# Patient Record
Sex: Male | Born: 1940
Health system: Southern US, Community
[De-identification: ages and names within clinical notes are randomized; demographics above are authoritative.]

## PROBLEM LIST (undated history)

## (undated) DIAGNOSIS — E119 Type 2 diabetes mellitus without complications: Secondary | ICD-10-CM

## (undated) DIAGNOSIS — M109 Gout, unspecified: Secondary | ICD-10-CM

## (undated) DIAGNOSIS — R011 Cardiac murmur, unspecified: Secondary | ICD-10-CM

## (undated) DIAGNOSIS — M2550 Pain in unspecified joint: Secondary | ICD-10-CM

## (undated) DIAGNOSIS — I350 Nonrheumatic aortic (valve) stenosis: Secondary | ICD-10-CM

## (undated) DIAGNOSIS — M1990 Unspecified osteoarthritis, unspecified site: Secondary | ICD-10-CM

## (undated) DIAGNOSIS — Z973 Presence of spectacles and contact lenses: Secondary | ICD-10-CM

## (undated) DIAGNOSIS — I209 Angina pectoris, unspecified: Secondary | ICD-10-CM

## (undated) DIAGNOSIS — I739 Peripheral vascular disease, unspecified: Secondary | ICD-10-CM

## (undated) DIAGNOSIS — I6523 Occlusion and stenosis of bilateral carotid arteries: Secondary | ICD-10-CM

## (undated) DIAGNOSIS — M79606 Pain in leg, unspecified: Secondary | ICD-10-CM

## (undated) DIAGNOSIS — N433 Hydrocele, unspecified: Secondary | ICD-10-CM

## (undated) DIAGNOSIS — N183 Chronic kidney disease, stage 3 unspecified: Secondary | ICD-10-CM

## (undated) DIAGNOSIS — Z8739 Personal history of other diseases of the musculoskeletal system and connective tissue: Secondary | ICD-10-CM

## (undated) DIAGNOSIS — E785 Hyperlipidemia, unspecified: Secondary | ICD-10-CM

## (undated) DIAGNOSIS — I2510 Atherosclerotic heart disease of native coronary artery without angina pectoris: Secondary | ICD-10-CM

## (undated) DIAGNOSIS — I10 Essential (primary) hypertension: Secondary | ICD-10-CM

## (undated) HISTORY — DX: Nonrheumatic aortic (valve) stenosis: I35.0

## (undated) HISTORY — DX: Gout, unspecified: M10.9

## (undated) HISTORY — DX: Pain in leg, unspecified: M79.606

## (undated) HISTORY — DX: Peripheral vascular disease, unspecified: I73.9

## (undated) HISTORY — DX: Essential (primary) hypertension: I10

## (undated) HISTORY — PX: ANTERIOR CERVICAL DECOMP/DISCECTOMY FUSION: SHX1161

## (undated) HISTORY — DX: Pain in unspecified joint: M25.50

## (undated) HISTORY — DX: Hyperlipidemia, unspecified: E78.5

## (undated) HISTORY — PX: CARDIAC CATHETERIZATION: SHX172

## (undated) HISTORY — DX: Unspecified osteoarthritis, unspecified site: M19.90

## (undated) HISTORY — PX: LUMBAR FUSION: SHX111

---

## 2008-08-27 HISTORY — PX: BACK SURGERY: SHX140

## 2008-08-27 HISTORY — PX: CERVICAL SPINE SURGERY: SHX589

## 2008-11-05 ENCOUNTER — Encounter: Admission: RE | Admit: 2008-11-05 | Discharge: 2008-11-05 | Payer: Self-pay | Admitting: Neurosurgery

## 2009-08-27 HISTORY — PX: CERVICAL SPINE SURGERY: SHX589

## 2010-12-27 ENCOUNTER — Encounter (INDEPENDENT_AMBULATORY_CARE_PROVIDER_SITE_OTHER): Payer: Medicare Other | Admitting: Vascular Surgery

## 2010-12-27 DIAGNOSIS — I7092 Chronic total occlusion of artery of the extremities: Secondary | ICD-10-CM

## 2010-12-28 NOTE — Consult Note (Signed)
NEW PATIENT CONSULTATION  Tony [name], SITES DOB:  10/17/1940                                       12/27/2010 [id number]#:[id number]  I saw patient in the office today in consultation concerning his right lower extremity claudication.  He was referred by Dr. Sherril Croon.  This is a pleasant 70 year old gentleman who states that he developed the gradual onset of pain in his right calf associated with ambulation and relieved with rest approximately 3 months ago.  These symptoms have gradually increased.  He states he can walk about 300 feet up a small hill in the back of his house before he experiences symptoms.  His symptoms are relieved with rest.  He has had no significant thigh or hip claudication on the right, and he has had no pain in the left leg.  He has no other aggravating or alleviating factors.  He has had no history of rest pain or nonhealing ulcers.  PAST MEDICAL HISTORY:  Significant for type 2 diabetes.  In addition, he has hypertension and hypercholesterolemia, both of which are well- controlled.  He denies any previous history of myocardial infarction, history of congestive heart failure, or a history of COPD.  PAST SURGICAL HISTORY:  Significant for previous surgery on his lower back approximately 2 years ago.  SOCIAL HISTORY:  He is married.  He had smoked 3 packs per day of cigarettes for over 40 years but quit tobacco in 1993.  FAMILY HISTORY:  He is unaware of any history of premature cardiovascular disease.  REVIEW OF SYSTEMS:  GENERAL:  He has had no recent weight loss, weight gain, or problems with his appetite. CARDIOVASCULAR:  He has had no chest pain, chest pressure, palpitations, or arrhythmias.  He has had no significant dyspnea on exertion or orthopnea.  He has had no history of stroke, TIAs, or amaurosis fugax. He has had no history of DVT or phlebitis. MUSCULOSKELETAL:  He does have a history of arthritis and joint pain. GI, neurologic,  pulmonary, hematologic, GU, ENT, psychiatric, integumentary review of systems is unremarkable, as documented on the medical history form in his chart.  PHYSICAL EXAMINATION:  This is a pleasant 70 year old gentleman who appears his stated age.  Blood pressure is 162/85, heart rate is 67, respiratory rate is 12.  HEENT:  Unremarkable.  Lungs are clear bilaterally to auscultation without rales, rhonchi, or wheezing. Cardiovascular:  I do not detect any carotid bruits.  He has a regular rate and rhythm.  He has palpable femoral pulses and a palpable left popliteal, left posterior tibial, and left dorsalis pedis pulse.  I cannot palpate popliteal or pedal pulses on the right side.  Both feet appear adequately perfused.  He has mild bilateral lower extremity swelling. Abdomen is soft and nontender with normal-pitched bowel sounds.  I cannot appreciate an aneurysm, although his abdomen is somewhat large and difficult to assess.  Musculoskeletal:  There are no major deformities or cyanosis.  Neurologic:  He has no focal weakness or paresthesias.  Skin:  There are no ulcers or rashes.  I have reviewed his arterial Doppler study from [facility].  He had a duplex scan which showed evidence of a distal right superficial femoral artery stenosis of approximately 50% with also some stenosis noted in the proximal right deep femoral artery.  There is no significant disease on the left side.  I have also reviewed his labs that were sent from Dr. Sherril Croon' office, which showed a creatinine of 1.05.  His hemoglobin is 14.5.  PSA is 0.5.  This patient presents with stable claudication of his right lower extremity.  We have discussed the importance of a structured walking program, and I have encouraged him to stay as active as possible.  We also discussed the potential use of cilostazol, although I did not push this.  This would be a consideration.  Fortunately, he quit smoking.  I have also explained  the option of proceeding with arteriography to look for a possible endovascular approach to his SFA stenosis, although the deep femoral artery stenosis could not be addressed from an endovascular standpoint.  He feels that his symptoms are quite tolerable and at this point does not wish to pursue arteriography, which I think is perfectly reasonable.  I will see him back in 6 months, and I will order follow-up ABI's for that time.  He knows to call sooner if he has problems. Clearly, if his symptoms progress, we could proceed with arteriography to evaluate this further.    Di Kindle. Edilia Bo, M.D. Electronically Signed  CSD/MEDQ  D:  12/27/2010  T:  12/28/2010  Job:  [id number]  cc:   Doreen Beam, MD

## 2011-05-14 ENCOUNTER — Encounter: Payer: Self-pay | Admitting: Vascular Surgery

## 2011-07-04 ENCOUNTER — Ambulatory Visit: Payer: Medicare Other | Admitting: Vascular Surgery

## 2011-10-10 ENCOUNTER — Ambulatory Visit: Payer: Medicare Other | Admitting: Vascular Surgery

## 2011-10-10 ENCOUNTER — Other Ambulatory Visit: Payer: Medicare Other

## 2011-12-17 ENCOUNTER — Telehealth: Payer: Self-pay

## 2011-12-17 DIAGNOSIS — M79669 Pain in unspecified lower leg: Secondary | ICD-10-CM

## 2011-12-17 DIAGNOSIS — I739 Peripheral vascular disease, unspecified: Secondary | ICD-10-CM

## 2011-12-17 NOTE — Telephone Encounter (Signed)
Pt. called to report symptoms of burning in right calf, and some burning in right thigh, a few inches above knee. States has swelling in right foot up to calf, and notices that "the swelling comes and goes".  States the discomfort has awakened him at night, recently.  Describes feeling a "knot in right calf at time of pain", and estimates the knot to be "less than dime size".  Denies any warmth, redness, or tenderness in right calf.  States no knot is palpable in right calf at this time. Reports occasional numbness and tingling in right leg, when questioned.  Will discuss with Dr. Judie Grieve orders for vascular studies, and call pt. to schedule appt.   Agrees w/ plan.

## 2011-12-18 NOTE — Telephone Encounter (Signed)
Discussed pt's symptoms with Dr. Arbie Cookey.  Recommends to schedule pt. for venous doppler, ABI's, and appt. with Dr. Edilia Bo.  Phone call to pt.  Left message regarding plan to schedule vascular studies and f/u appt. W/ Dr. Edilia Bo.

## 2011-12-19 ENCOUNTER — Telehealth: Payer: Self-pay | Admitting: Vascular Surgery

## 2011-12-19 NOTE — Telephone Encounter (Signed)
Spoke to patient regarding vascular labs appts per Carol's request. He is coming Friday 12/21/11 at 2pm for vascular lab studies and will see CSD on 12/26/11 at 3:15pm. He is aware of appts and requested that I email him directions to our office. I will do this today. Jacklyn Shell

## 2011-12-21 ENCOUNTER — Encounter (INDEPENDENT_AMBULATORY_CARE_PROVIDER_SITE_OTHER): Payer: Medicare Other | Admitting: *Deleted

## 2011-12-21 DIAGNOSIS — M79609 Pain in unspecified limb: Secondary | ICD-10-CM

## 2011-12-21 DIAGNOSIS — I739 Peripheral vascular disease, unspecified: Secondary | ICD-10-CM

## 2011-12-21 DIAGNOSIS — M79669 Pain in unspecified lower leg: Secondary | ICD-10-CM

## 2011-12-26 ENCOUNTER — Ambulatory Visit: Payer: Medicare Other | Admitting: Vascular Surgery

## 2011-12-26 NOTE — Procedures (Unsigned)
DUPLEX DEEP VENOUS EXAM - LOWER EXTREMITY  INDICATION:  Pain in right calf with palpable knot.  HISTORY:  Edema:  Yes Trauma/Surgery:  No Pain:  Yes PE:  No Previous DVT:  No Anticoagulants:  No Other:  DUPLEX EXAM:               CFV   SFV   PopV  PTV    GSV               R  L  R  L  R  L  R   L  R  L Thrombosis    0     0     0     0      0 Spontaneous   +     +     +     +      + Phasic        +     +     +     +      + Augmentation  +     +     +     +      + Compressible  +     +     +     +      + Competent     +     +     +     +      +  Legend:  + - yes  o - no  p - partial  D - decreased   IMPRESSION: 1. No evidence of deep venous thrombosis or superficial venous     thrombus in the right lower extremity. 2. Incidentally, there is a severe (>75%) stenosis of the distal     superficial femoral artery/above knee popliteal artery with     velocities of 558 cm/s and a ratio of 6.6.        _____________________________ Di Kindle. Edilia Bo, M.D.  LT/MEDQ  D:  12/21/2011  T:  12/21/2011  Job:  [id number]

## 2013-01-30 ENCOUNTER — Encounter: Payer: Self-pay | Admitting: *Deleted

## 2013-01-30 ENCOUNTER — Ambulatory Visit (INDEPENDENT_AMBULATORY_CARE_PROVIDER_SITE_OTHER): Payer: Medicare Other | Admitting: Cardiology

## 2013-01-30 ENCOUNTER — Encounter: Payer: Self-pay | Admitting: Cardiology

## 2013-01-30 VITALS — BP 152/68 | HR 61 | Ht 70.0 in | Wt 232.8 lb

## 2013-01-30 DIAGNOSIS — I200 Unstable angina: Secondary | ICD-10-CM

## 2013-01-30 DIAGNOSIS — Z01810 Encounter for preprocedural cardiovascular examination: Secondary | ICD-10-CM

## 2013-01-30 DIAGNOSIS — I209 Angina pectoris, unspecified: Secondary | ICD-10-CM

## 2013-01-30 LAB — BASIC METABOLIC PANEL
BUN: 17 mg/dL (ref 6–23)
Calcium: 9.3 mg/dL (ref 8.4–10.5)
Creatinine, Ser: 1.1 mg/dL (ref 0.4–1.5)
GFR: 69.45 mL/min (ref >60.00)
Glucose, Bld: 146 mg/dL — ABNORMAL HIGH (ref 70–99)
Potassium: 3.8 mEq/L (ref 3.5–5.1)

## 2013-01-30 LAB — CBC
HCT: 40.3 % (ref 39.0–52.0)
Hemoglobin: 13.6 g/dL (ref 13.0–17.0)
MCV: 87.7 fl (ref 78.0–100.0)
Platelets: 179 10*3/uL (ref 150.0–400.0)
RBC: 4.59 Mil/uL (ref 4.22–5.81)

## 2013-01-30 MED ORDER — METOPROLOL TARTRATE 25 MG PO TABS: 12.5000 mg | ORAL_TABLET | Freq: Two times a day (BID) | ORAL | Status: DC

## 2013-01-30 NOTE — Patient Instructions (Addendum)
Please start Metoprolol 25 mg 1/2 tablet twice a day. Continue all other medications as listed.  Please have blood work today (BMP,CBC and INR)  Your physician has requested that you have a cardiac catheterization on February 03, 2013. Cardiac catheterization is used to diagnose and/or treat various heart conditions. Doctors may recommend this procedure for a number of different reasons. The most common reason is to evaluate chest pain. Chest pain can be a symptom of coronary artery disease (CAD), and cardiac catheterization can show whether plaque is narrowing or blocking your heart's arteries. This procedure is also used to evaluate the valves, as well as measure the blood flow and oxygen levels in different parts of your heart. For further information please visit [email or web address] Please follow instruction sheet, as given.  Follow up will be scheduled after your testing.

## 2013-01-30 NOTE — Progress Notes (Signed)
 HPI The patient presents for evaluation of chest discomfort. In 1993 he did have evidence of coronary disease and he reports an 85% blockage around the back of his heart that was apparently managed medically. He has not had further evaluation since that time. However, he is now presenting with similar symptoms though worse. It happens walking up to 3% incline to his mailbox. It has been going on for 3 months. It is getting worse happening with increasing frequency and intensity. He has not yet had any at rest. He describes some discomfort in his abdomen and up in his chest and into his right arm. It is a burning discomfort. He does like he can't take a deep breath. He says it goes away in about 5 minutes. He has bilateral arm weakness with it. He denies any nausea or acute diaphoresis. He does note that his blood pressure and heart rate goes up with this.  Allergies  Allergen Reactions  . Aspirin     Causes gout flare  . Cortisone Nausea And Vomiting    Current Outpatient Prescriptions  Medication Sig Dispense Refill  . amLODipine (NORVASC) 5 MG tablet Take 5 mg by mouth daily.        . benazepril (LOTENSIN) 20 MG tablet Take 20 mg by mouth daily.        . colchicine 0.6 MG tablet Take 0.6 mg by mouth as needed.      . glipiZIDE (GLUCOTROL) 5 MG tablet Take 5 mg by mouth daily.        . methocarbamol (ROBAXIN) 750 MG tablet Take 750 mg by mouth as needed.         No current facility-administered medications for this visit.    Past Medical History  Diagnosis Date  . Diabetes mellitus Age 72    Type 2  . Hypertension   . Hyperlipidemia   . Arthritis   . Joint pain   . Leg pain     Past Surgical History  Procedure Laterality Date  . Back surgery  2010    lower back    No family history on file.  History   Social History  . Marital Status: Married    Spouse Name: N/A    Number of Children: N/A  . Years of Education: N/A   Occupational History  . Not on file.    Social History Main Topics  . Smoking status: Former Smoker -- 3.00 packs/day for 40 years    Types: Cigarettes    Quit date: 05/27/1992  . Smokeless tobacco: Not on file  . Alcohol Use: No  . Drug Use:   . Sexually Active:    Other Topics Concern  . Not on file   Social History Narrative  . No narrative on file    ROS:  Joint pain in his hands, reflux. Otherwise as stated in the HPI and negative for all other systems.  PHYSICAL EXAM BP 152/68  Pulse 61  Ht 5' 10" (1.778 m)  Wt 232 lb 12.8 oz (105.597 kg)  BMI 33.4 kg/m2 GENERAL:  Well appearing HEENT:  Pupils equal round and reactive, fundi not visualized, oral mucosa unremarkable NECK:  No jugular venous distention, waveform within normal limits, carotid upstroke brisk and symmetric, no bruits, no thyromegaly LYMPHATICS:  No cervical, inguinal adenopathy LUNGS:  Clear to auscultation bilaterally BACK:  No CVA tenderness CHEST:  Unremarkable HEART:  PMI not displaced or sustained,S1 and S2 within normal limits, no S3, no S4, no clicks,   no rubs, right upper sternal border murmur, no diastolic murmurs ABD:  Flat, positive bowel sounds normal in frequency in pitch, no bruits, no rebound, no guarding, no midline pulsatile mass, no hepatomegaly, no splenomegaly EXT:  2 plus pulses throughout, no edema, no cyanosis no clubbing SKIN:  No rashes no nodules NEURO:  Cranial nerves II through XII grossly intact, motor grossly intact throughout PSYCH:  Cognitively intact, oriented to person place and time   EKG:  Sinus rhythm, rate 61, axis within normal limits, cannot exclude old anteroseptal infarct, no acute ST-T wave changes. 01/30/2013  ASSESSMENT AND PLAN  CLASS III ANGINA:  The patient has symptoms consistent with this and high risk for obstructive coronary disease. Therefore, cardiac catheterization is indicated. The patient understands that risks included but are not limited to stroke (1 in 1000), death (1 in 1000), kidney  failure [usually temporary] (1 in 500), bleeding (1 in 200), allergic reaction [possibly serious] (1 in 200).  The patient understands and agrees to proceed.  I will at a low dose of beta blocker.  Unfortunately he cannot take aspirin because of severe. I do not want to start Plavix as I am concerned that he might have 3 vessel coronary artery disease needing surgery.  AORTIC STENOSIS:  This is mild. No further workup is suggested.  DIABETES:  I will defer to Dr. Vyas  HYPERLIPIDEMIA:  I will have a discussion with him about this at the time of the cath.  He would have indication benefit from emperic statin.    

## 2013-02-01 DIAGNOSIS — I200 Unstable angina: Secondary | ICD-10-CM | POA: Insufficient documentation

## 2013-02-02 ENCOUNTER — Telehealth: Payer: Self-pay | Admitting: Cardiology

## 2013-02-02 NOTE — Telephone Encounter (Signed)
New Prob      Pt is scheduled to have a cath tomorrow but needs to reschedule due to no transportation.  Please call.

## 2013-02-02 NOTE — Telephone Encounter (Signed)
Patient called to let us know that he thought cath was scheduled 6/12 and did not realize he was scheduled for tomorrow (6/10) until 4:30 when cath lab called him to verify.  I called [facility] cath lab to let them know patient will not be there tomorrow.  I spoke with Arlys John in main cath lab and he tried to call [facility] but did not reach anyone.  He said he would page the on-call nurse and let them know of patient's cx for tomorrow.  I advised patient that I could not reschedule him at this time and that I will forward message to Avie Arenas, RN for her to reschedule.  Patient requests being called on his cell:  [phone]

## 2013-02-03 NOTE — Telephone Encounter (Signed)
Left a message on voicemail.  Needs to be rescheduled for Thursday AM.

## 2013-02-03 NOTE — Telephone Encounter (Signed)
Left message for pt he has been rescheduled for Thursday AM - to report at 7:30 am for an 8:30 am case and to call back with any questions or concerns.

## 2013-02-03 NOTE — Telephone Encounter (Signed)
Pt aware of date and time.

## 2013-02-05 ENCOUNTER — Encounter (HOSPITAL_BASED_OUTPATIENT_CLINIC_OR_DEPARTMENT_OTHER)
Admission: RE | Disposition: A | Discharge status: Hospice | Payer: Self-pay | Source: Ambulatory Visit | Attending: Cardiology

## 2013-02-05 ENCOUNTER — Inpatient Hospital Stay (HOSPITAL_COMMUNITY): Payer: Medicare Other

## 2013-02-05 ENCOUNTER — Inpatient Hospital Stay (HOSPITAL_COMMUNITY)
Admission: AD | Admit: 2013-02-05 | Discharge: 2013-02-14 | DRG: 234 | Disposition: A | Discharge status: Home / Self Care | Payer: Medicare Other | Source: Ambulatory Visit | Attending: Cardiothoracic Surgery | Admitting: Cardiothoracic Surgery

## 2013-02-05 ENCOUNTER — Other Ambulatory Visit: Payer: Self-pay | Admitting: *Deleted

## 2013-02-05 ENCOUNTER — Inpatient Hospital Stay (HOSPITAL_BASED_OUTPATIENT_CLINIC_OR_DEPARTMENT_OTHER)
Admission: RE | Admit: 2013-02-05 | Discharge: 2013-02-05 | Disposition: A | Discharge status: Hospice | Payer: Medicare Other | Source: Ambulatory Visit | Attending: Cardiology | Admitting: Cardiology

## 2013-02-05 ENCOUNTER — Encounter (HOSPITAL_COMMUNITY): Payer: Self-pay | Admitting: General Practice

## 2013-02-05 DIAGNOSIS — E785 Hyperlipidemia, unspecified: Secondary | ICD-10-CM | POA: Diagnosis present

## 2013-02-05 DIAGNOSIS — E8779 Other fluid overload: Secondary | ICD-10-CM | POA: Diagnosis not present

## 2013-02-05 DIAGNOSIS — I2510 Atherosclerotic heart disease of native coronary artery without angina pectoris: Secondary | ICD-10-CM | POA: Insufficient documentation

## 2013-02-05 DIAGNOSIS — Z87891 Personal history of nicotine dependence: Secondary | ICD-10-CM

## 2013-02-05 DIAGNOSIS — M1990 Unspecified osteoarthritis, unspecified site: Secondary | ICD-10-CM | POA: Diagnosis present

## 2013-02-05 DIAGNOSIS — E119 Type 2 diabetes mellitus without complications: Secondary | ICD-10-CM | POA: Insufficient documentation

## 2013-02-05 DIAGNOSIS — M109 Gout, unspecified: Secondary | ICD-10-CM | POA: Diagnosis present

## 2013-02-05 DIAGNOSIS — I209 Angina pectoris, unspecified: Secondary | ICD-10-CM | POA: Insufficient documentation

## 2013-02-05 DIAGNOSIS — E876 Hypokalemia: Secondary | ICD-10-CM | POA: Diagnosis not present

## 2013-02-05 DIAGNOSIS — R066 Hiccough: Secondary | ICD-10-CM | POA: Diagnosis not present

## 2013-02-05 DIAGNOSIS — I10 Essential (primary) hypertension: Secondary | ICD-10-CM | POA: Diagnosis present

## 2013-02-05 DIAGNOSIS — Z794 Long term (current) use of insulin: Secondary | ICD-10-CM

## 2013-02-05 DIAGNOSIS — R339 Retention of urine, unspecified: Secondary | ICD-10-CM | POA: Diagnosis not present

## 2013-02-05 DIAGNOSIS — D696 Thrombocytopenia, unspecified: Secondary | ICD-10-CM | POA: Diagnosis not present

## 2013-02-05 DIAGNOSIS — I200 Unstable angina: Secondary | ICD-10-CM | POA: Diagnosis present

## 2013-02-05 DIAGNOSIS — K5900 Constipation, unspecified: Secondary | ICD-10-CM | POA: Diagnosis not present

## 2013-02-05 DIAGNOSIS — I25810 Atherosclerosis of coronary artery bypass graft(s) without angina pectoris: Secondary | ICD-10-CM

## 2013-02-05 DIAGNOSIS — D62 Acute posthemorrhagic anemia: Secondary | ICD-10-CM | POA: Diagnosis not present

## 2013-02-05 DIAGNOSIS — I739 Peripheral vascular disease, unspecified: Secondary | ICD-10-CM | POA: Diagnosis present

## 2013-02-05 HISTORY — DX: Angina pectoris, unspecified: I20.9

## 2013-02-05 HISTORY — DX: Atherosclerotic heart disease of native coronary artery without angina pectoris: I25.10

## 2013-02-05 LAB — BLOOD GAS, ARTERIAL
Acid-base deficit: 2 mmol/L (ref 0.0–2.0)
Bicarbonate: 22.3 mEq/L (ref 20.0–24.0)
Drawn by: 358491
FIO2: 0.21 %
O2 Saturation: 94.5 %
Patient temperature: 98.6
TCO2: 23.4 mmol/L (ref 0–100)
pCO2 arterial: 38.3 mmHg (ref 35.0–45.0)
pH, Arterial: 7.382 (ref 7.350–7.450)
pO2, Arterial: 73.5 mmHg — ABNORMAL LOW (ref 80.0–100.0)

## 2013-02-05 LAB — URINALYSIS, ROUTINE W REFLEX MICROSCOPIC
Bilirubin Urine: NEGATIVE
Glucose, UA: NEGATIVE mg/dL
Hgb urine dipstick: NEGATIVE
Ketones, ur: NEGATIVE mg/dL
Leukocytes, UA: NEGATIVE
Nitrite: NEGATIVE
Protein, ur: NEGATIVE mg/dL
Specific Gravity, Urine: 1.02 (ref 1.005–1.030)
Urobilinogen, UA: 0.2 mg/dL (ref 0.0–1.0)
pH: 6.5 (ref 5.0–8.0)

## 2013-02-05 LAB — COMPREHENSIVE METABOLIC PANEL
ALT: 20 U/L (ref 0–53)
AST: 15 U/L (ref 0–37)
Albumin: 4 g/dL (ref 3.5–5.2)
Alkaline Phosphatase: 77 U/L (ref 39–117)
BUN: 18 mg/dL (ref 6–23)
CO2: 26 mEq/L (ref 19–32)
Calcium: 9.3 mg/dL (ref 8.4–10.5)
Chloride: 100 mEq/L (ref 96–112)
Creatinine, Ser: 1.15 mg/dL (ref 0.50–1.35)
GFR calc Af Amer: 73 mL/min — ABNORMAL LOW (ref >90)
GFR calc non Af Amer: 63 mL/min — ABNORMAL LOW (ref >90)
Glucose, Bld: 114 mg/dL — ABNORMAL HIGH (ref 70–99)
Potassium: 4.6 mEq/L (ref 3.5–5.1)
Sodium: 137 mEq/L (ref 135–145)
Total Bilirubin: 0.3 mg/dL (ref 0.3–1.2)
Total Protein: 7.6 g/dL (ref 6.0–8.3)

## 2013-02-05 LAB — PROTIME-INR
INR: 0.9 (ref 0.00–1.49)
Prothrombin Time: 12.1 seconds (ref 11.6–15.2)

## 2013-02-05 LAB — HEMOGLOBIN A1C
Hgb A1c MFr Bld: 6.5 % — ABNORMAL HIGH (ref <5.7)
Mean Plasma Glucose: 140 mg/dL — ABNORMAL HIGH (ref <117)

## 2013-02-05 LAB — CBC
HCT: 39.4 % (ref 39.0–52.0)
Hemoglobin: 13.9 g/dL (ref 13.0–17.0)
MCH: 30.2 pg (ref 26.0–34.0)
MCHC: 35.3 g/dL (ref 30.0–36.0)
MCV: 85.5 fL (ref 78.0–100.0)
Platelets: 178 10*3/uL (ref 150–400)
RBC: 4.61 MIL/uL (ref 4.22–5.81)
RDW: 13.3 % (ref 11.5–15.5)
WBC: 7.3 10*3/uL (ref 4.0–10.5)

## 2013-02-05 LAB — GLUCOSE, CAPILLARY
Glucose-Capillary: 171 mg/dL — ABNORMAL HIGH (ref 70–99)
Glucose-Capillary: 99 mg/dL (ref 70–99)
Glucose-Capillary: 178 mg/dL — ABNORMAL HIGH (ref 70–99)

## 2013-02-05 LAB — SURGICAL PCR SCREEN
MRSA, PCR: NEGATIVE
Staphylococcus aureus: NEGATIVE

## 2013-02-05 LAB — APTT: aPTT: 30 seconds (ref 24–37)

## 2013-02-05 SURGERY — JV LEFT HEART CATHETERIZATION WITH CORONARY ANGIOGRAM
Anesthesia: Moderate Sedation

## 2013-02-05 MED ORDER — CHLORHEXIDINE GLUCONATE 4 % EX LIQD
60.0000 mL | Freq: Once | CUTANEOUS | Status: AC
  Administered 2013-02-06: 4 via TOPICAL
  Filled 2013-02-05 (×2): qty 60

## 2013-02-05 MED ORDER — SODIUM CHLORIDE 0.9 % IV SOLN: INTRAVENOUS | Status: AC

## 2013-02-05 MED ORDER — PLASMA-LYTE 148 IV SOLN
INTRAVENOUS | Status: AC
  Filled 2013-02-05: qty 2.5

## 2013-02-05 MED ORDER — METOPROLOL TARTRATE 12.5 MG HALF TABLET
12.5000 mg | ORAL_TABLET | Freq: Once | ORAL | Status: AC
  Administered 2013-02-06: 12.5 mg via ORAL
  Filled 2013-02-05: qty 1

## 2013-02-05 MED ORDER — DEXMEDETOMIDINE HCL IN NACL 400 MCG/100ML IV SOLN
0.1000 ug/kg/h | INTRAVENOUS | Status: AC
  Filled 2013-02-05: qty 100

## 2013-02-05 MED ORDER — SODIUM CHLORIDE 0.9 % IV SOLN
INTRAVENOUS | Status: AC
  Filled 2013-02-05: qty 40

## 2013-02-05 MED ORDER — ACETAMINOPHEN 325 MG PO TABS: 650.0000 mg | ORAL_TABLET | ORAL | Status: DC | PRN

## 2013-02-05 MED ORDER — BISACODYL 5 MG PO TBEC
5.0000 mg | DELAYED_RELEASE_TABLET | Freq: Once | ORAL | Status: DC
  Filled 2013-02-05: qty 1

## 2013-02-05 MED ORDER — NITROGLYCERIN 0.4 MG SL SUBL: 0.4000 mg | SUBLINGUAL_TABLET | SUBLINGUAL | Status: DC | PRN

## 2013-02-05 MED ORDER — DOPAMINE-DEXTROSE 3.2-5 MG/ML-% IV SOLN
2.0000 ug/kg/min | INTRAVENOUS | Status: AC
  Filled 2013-02-05 (×2): qty 250

## 2013-02-05 MED ORDER — ATORVASTATIN CALCIUM 80 MG PO TABS
80.0000 mg | ORAL_TABLET | Freq: Every day | ORAL | Status: DC
  Administered 2013-02-05 – 2013-02-06 (×2): 80 mg via ORAL
  Filled 2013-02-05 (×3): qty 1

## 2013-02-05 MED ORDER — BENAZEPRIL HCL 20 MG PO TABS
20.0000 mg | ORAL_TABLET | Freq: Every day | ORAL | Status: DC
  Administered 2013-02-05 – 2013-02-08 (×4): 20 mg via ORAL
  Filled 2013-02-05 (×5): qty 1

## 2013-02-05 MED ORDER — AMLODIPINE BESYLATE 5 MG PO TABS
5.0000 mg | ORAL_TABLET | Freq: Every day | ORAL | Status: DC
  Administered 2013-02-05 – 2013-02-08 (×4): 5 mg via ORAL
  Filled 2013-02-05 (×5): qty 1

## 2013-02-05 MED ORDER — DEXTROSE 5 % IV SOLN
750.0000 mg | INTRAVENOUS | Status: AC
  Filled 2013-02-05 (×2): qty 750

## 2013-02-05 MED ORDER — NITROGLYCERIN IN D5W 200-5 MCG/ML-% IV SOLN
2.0000 ug/min | INTRAVENOUS | Status: AC
  Filled 2013-02-05: qty 250

## 2013-02-05 MED ORDER — PHENYLEPHRINE HCL 10 MG/ML IJ SOLN
30.0000 ug/min | INTRAVENOUS | Status: AC
  Filled 2013-02-05: qty 2

## 2013-02-05 MED ORDER — ONDANSETRON HCL 4 MG/2ML IJ SOLN: 4.0000 mg | Freq: Four times a day (QID) | INTRAMUSCULAR | Status: DC | PRN

## 2013-02-05 MED ORDER — METHOCARBAMOL 750 MG PO TABS
750.0000 mg | ORAL_TABLET | Freq: Three times a day (TID) | ORAL | Status: DC | PRN
  Filled 2013-02-05: qty 1

## 2013-02-05 MED ORDER — INSULIN ASPART 100 UNIT/ML ~~LOC~~ SOLN
0.0000 [IU] | Freq: Three times a day (TID) | SUBCUTANEOUS | Status: DC
  Administered 2013-02-06: 1 [IU] via SUBCUTANEOUS
  Administered 2013-02-06: 2 [IU] via SUBCUTANEOUS
  Administered 2013-02-07: 1 [IU] via SUBCUTANEOUS

## 2013-02-05 MED ORDER — MAGNESIUM SULFATE 50 % IJ SOLN
40.0000 meq | INTRAMUSCULAR | Status: AC
  Filled 2013-02-05: qty 10

## 2013-02-05 MED ORDER — CHLORHEXIDINE GLUCONATE 4 % EX LIQD
60.0000 mL | Freq: Once | CUTANEOUS | Status: AC
  Administered 2013-02-05: 4 via TOPICAL
  Filled 2013-02-05: qty 60

## 2013-02-05 MED ORDER — POTASSIUM CHLORIDE 2 MEQ/ML IV SOLN
80.0000 meq | INTRAVENOUS | Status: AC
  Filled 2013-02-05: qty 40

## 2013-02-05 MED ORDER — TEMAZEPAM 15 MG PO CAPS
15.0000 mg | ORAL_CAPSULE | Freq: Once | ORAL | Status: AC | PRN
  Filled 2013-02-05: qty 1

## 2013-02-05 MED ORDER — SODIUM CHLORIDE 0.9 % IV SOLN
INTRAVENOUS | Status: AC
  Filled 2013-02-05: qty 1

## 2013-02-05 MED ORDER — METOPROLOL TARTRATE 12.5 MG HALF TABLET
12.5000 mg | ORAL_TABLET | Freq: Two times a day (BID) | ORAL | Status: DC
  Administered 2013-02-05 – 2013-02-08 (×7): 12.5 mg via ORAL
  Filled 2013-02-05 (×10): qty 1

## 2013-02-05 MED ORDER — SODIUM CHLORIDE 0.9 % IV SOLN
1500.0000 mg | INTRAVENOUS | Status: AC
  Filled 2013-02-05: qty 1500

## 2013-02-05 MED ORDER — SODIUM CHLORIDE 0.9 % IV SOLN
INTRAVENOUS | Status: AC
  Filled 2013-02-05: qty 30

## 2013-02-05 MED ORDER — DEXTROSE 5 % IV SOLN
1.5000 g | INTRAVENOUS | Status: AC
  Filled 2013-02-05: qty 1.5

## 2013-02-05 MED ORDER — GLIPIZIDE 5 MG PO TABS
5.0000 mg | ORAL_TABLET | Freq: Every day | ORAL | Status: DC
  Administered 2013-02-05 – 2013-02-08 (×4): 5 mg via ORAL
  Filled 2013-02-05 (×5): qty 1

## 2013-02-05 MED ORDER — EPINEPHRINE HCL 1 MG/ML IJ SOLN
0.5000 ug/min | INTRAVENOUS | Status: AC
  Filled 2013-02-05: qty 4

## 2013-02-05 NOTE — OR Nursing (Signed)
Report called to Selena Batten, RN on 2000

## 2013-02-05 NOTE — Interval H&P Note (Signed)
  History and Physical Interval Note:  02/05/2013 9:04 AM  Tony [name]  has presented today for surgery, with the diagnosis of chest pain  The various methods of treatment have been discussed with the patient and family. After consideration of risks, benefits and other options for treatment, the patient has consented to  Procedure(s): [facility] LEFT HEART CATHETERIZATION WITH CORONARY ANGIOGRAM (N/A) as a surgical intervention .  The patient's history has been reviewed, patient examined, no change in status, stable for surgery.  I have reviewed the patient's chart and labs.  Questions were answered to the patient's satisfaction.     [name]

## 2013-02-05 NOTE — H&P (View-Only) (Signed)
HPI The patient presents for evaluation of chest discomfort. In 1993 he did have evidence of coronary disease and he reports an 85% blockage around the back of his heart that was apparently managed medically. He has not had further evaluation since that time. However, he is now presenting with similar symptoms though worse. It happens walking up to 3% incline to his mailbox. It has been going on for 3 months. It is getting worse happening with increasing frequency and intensity. He has not yet had any at rest. He describes some discomfort in his abdomen and up in his chest and into his right arm. It is a burning discomfort. He does like he can't take a deep breath. He says it goes away in about 5 minutes. He has bilateral arm weakness with it. He denies any nausea or acute diaphoresis. He does note that his blood pressure and heart rate goes up with this.  Allergies  Allergen Reactions  . Aspirin     Causes gout flare  . Cortisone Nausea And Vomiting    Current Outpatient Prescriptions  Medication Sig Dispense Refill  . amLODipine (NORVASC) 5 MG tablet Take 5 mg by mouth daily.        . benazepril (LOTENSIN) 20 MG tablet Take 20 mg by mouth daily.        . colchicine 0.6 MG tablet Take 0.6 mg by mouth as needed.      Marland Kitchen glipiZIDE (GLUCOTROL) 5 MG tablet Take 5 mg by mouth daily.        . methocarbamol (ROBAXIN) 750 MG tablet Take 750 mg by mouth as needed.         No current facility-administered medications for this visit.    Past Medical History  Diagnosis Date  . Diabetes mellitus Age 72    Type 2  . Hypertension   . Hyperlipidemia   . Arthritis   . Joint pain   . Leg pain     Past Surgical History  Procedure Laterality Date  . Back surgery  2010    lower back    No family history on file.  History   Social History  . Marital Status: Married    Spouse Name: N/A    Number of Children: N/A  . Years of Education: N/A   Occupational History  . Not on file.    Social History Main Topics  . Smoking status: Former Smoker -- 3.00 packs/day for 40 years    Types: Cigarettes    Quit date: 05/27/1992  . Smokeless tobacco: Not on file  . Alcohol Use: No  . Drug Use:   . Sexually Active:    Other Topics Concern  . Not on file   Social History Narrative  . No narrative on file    ROS:  Joint pain in his hands, reflux. Otherwise as stated in the HPI and negative for all other systems.  PHYSICAL EXAM BP 152/68  Pulse 61  Ht 5\' 10"  (1.778 m)  Wt 232 lb 12.8 oz (105.597 kg)  BMI 33.4 kg/m2 GENERAL:  Well appearing HEENT:  Pupils equal round and reactive, fundi not visualized, oral mucosa unremarkable NECK:  No jugular venous distention, waveform within normal limits, carotid upstroke brisk and symmetric, no bruits, no thyromegaly LYMPHATICS:  No cervical, inguinal adenopathy LUNGS:  Clear to auscultation bilaterally BACK:  No CVA tenderness CHEST:  Unremarkable HEART:  PMI not displaced or sustained,S1 and S2 within normal limits, no S3, no S4, no clicks,  no rubs, right upper sternal border murmur, no diastolic murmurs ABD:  Flat, positive bowel sounds normal in frequency in pitch, no bruits, no rebound, no guarding, no midline pulsatile mass, no hepatomegaly, no splenomegaly EXT:  2 plus pulses throughout, no edema, no cyanosis no clubbing SKIN:  No rashes no nodules NEURO:  Cranial nerves II through XII grossly intact, motor grossly intact throughout PSYCH:  Cognitively intact, oriented to person place and time   EKG:  Sinus rhythm, rate 61, axis within normal limits, cannot exclude old anteroseptal infarct, no acute ST-T wave changes. 01/30/2013  ASSESSMENT AND PLAN  CLASS III ANGINA:  The patient has symptoms consistent with this and high risk for obstructive coronary disease. Therefore, cardiac catheterization is indicated. The patient understands that risks included but are not limited to stroke (1 in 1000), death (1 in 1000), kidney  failure [usually temporary] (1 in 500), bleeding (1 in 200), allergic reaction [possibly serious] (1 in 200).  The patient understands and agrees to proceed.  I will at a low dose of beta blocker.  Unfortunately he cannot take aspirin because of severe. I do not want to start Plavix as I am concerned that he might have 3 vessel coronary artery disease needing surgery.  AORTIC STENOSIS:  This is mild. No further workup is suggested.  DIABETES:  I will defer to Dr. Sherril Croon  HYPERLIPIDEMIA:  I will have a discussion with him about this at the time of the cath.  He would have indication benefit from emperic statin.

## 2013-02-05 NOTE — OR Nursing (Signed)
Dr Morton Peters at bedside to discuss surgery

## 2013-02-05 NOTE — Consult Note (Signed)
[address]       [place] [address]             [phone]        Nyoka Lint [place] Medical Record #[id number] Date of Birth: 12-09-1940  Referring: No ref. provider found Primary Care: Ignatius Specking., MD  Chief Complaint:   No chief complaint on file.  exertional chest pain  History of Present Illness:     72 year old Caucasian male diabetic nonsmoker presents with several weeks of progressive exertional chest pain especially walking up an incline which is relieved by rest. It is a sub-sternal squeezing pain radiating to both elbows. He denies any nocturnal or resting symptoms. He underwent cardiac catheterization today by Dr. Cherylynn Ridges which demonstrates an 80% left main stenosis 95% ostial LAD stenosis and three-vessel CAD. LVEF is well preserved. Surgical coronary revascularization is recommended.  Risk factors include hypertension, dyslipidemia and diabetes and obesity  Current Activity/ Functional Status: Patient very active doing yard and gardening work  Zubrod Score: At the time of surgery this patient's most appropriate activity status/level should be described as: []  Normal activity, no symptoms [x]  Symptoms, fully ambulatory []  Symptoms, in bed less than or equal to 50% of the time []  Symptoms, in bed greater than 50% of the time but less than 100% []  Bedridden []  Moribund  Past Medical History  Diagnosis Date  . Diabetes mellitus Age 89    Type 2  . Hypertension   . Hyperlipidemia   . Arthritis   . Joint pain   . Leg pain   . Gout   . PVD (peripheral vascular disease)     SFA 50%, stenosis right profunda  . Coronary artery disease   . Anginal pain     Past Surgical History  Procedure Laterality Date  . Back surgery  2010    lumbar  . Cervical spine surgery      History  Smoking status  . Former Smoker -- 3.00 packs/day for 40 years  . Types: Cigarettes  . Quit date: 05/27/1992  Smokeless tobacco  . Never Used      History  Alcohol Use No    History   Social History  . Marital Status: Married    Spouse Name: N/A    Number of Children: N/A  . Years of Education: N/A   Occupational History  . Not on file.   Social History Main Topics  . Smoking status: Former Smoker -- 3.00 packs/day for 40 years    Types: Cigarettes    Quit date: 05/27/1992  . Smokeless tobacco: Never Used  . Alcohol Use: No  . Drug Use: No  . Sexually Active: Not on file   Other Topics Concern  . Not on file   Social History Narrative   Lives at home with wife.           Allergies  Allergen Reactions  . Aspirin     Causes gout flare  . Cortisone Nausea And Vomiting    Current Facility-Administered Medications  Medication Dose Route Frequency Provider Last Rate Last Dose  . 0.9 %  sodium chloride infusion   Intravenous Continuous Dayna N Dunn, PA-C 105 mL/hr at 02/05/13 1211 105 mL/hr at 02/05/13 1211  . acetaminophen (TYLENOL) tablet 650 mg  650 mg Oral Q4H PRN Dayna N Dunn, PA-C      . [START ON 02/06/2013] aminocaproic acid (AMICAR) 10 g in sodium chloride 0.9 % 100  mL infusion   Intravenous To OR Kerin Perna, MD      . amLODipine (NORVASC) tablet 5 mg  5 mg Oral QHS Dayna N Dunn, PA-C      . atorvastatin (LIPITOR) tablet 80 mg  80 mg Oral q1800 Dayna N Dunn, PA-C      . benazepril (LOTENSIN) tablet 20 mg  20 mg Oral QHS Dayna N Dunn, PA-C      . bisacodyl (DULCOLAX) EC tablet 5 mg  5 mg Oral Once Erin Barrett, PA-C      . [START ON 02/06/2013] cefUROXime (ZINACEF) 1.5 g in dextrose 5 % 50 mL IVPB  1.5 g Intravenous To OR Kerin Perna, MD      . Melene Muller ON 02/06/2013] cefUROXime (ZINACEF) 750 mg in dextrose 5 % 50 mL IVPB  750 mg Intravenous To OR Kerin Perna, MD      . chlorhexidine (HIBICLENS) 4 % liquid 4 application  60 mL Topical Once Erin Barrett, PA-C      . [START ON 02/06/2013] chlorhexidine (HIBICLENS) 4 % liquid 4 application  60 mL Topical Once Erin Barrett, PA-C      . [START ON  02/06/2013] dexmedetomidine (PRECEDEX) 400 MCG/100ML infusion  0.1-0.7 mcg/kg/hr Intravenous To OR Kerin Perna, MD      . Melene Muller ON 02/06/2013] DOPamine (INTROPIN) 800 mg in dextrose 5 % 250 mL infusion  2-20 mcg/kg/min Intravenous To OR Kerin Perna, MD      . Melene Muller ON 02/06/2013] EPINEPHrine (ADRENALIN) 4,000 mcg in dextrose 5 % 250 mL infusion  0.5-20 mcg/min Intravenous To OR Kerin Perna, MD      . glipiZIDE (GLUCOTROL) tablet 5 mg  5 mg Oral QHS Dayna N Dunn, PA-C      . [START ON 02/06/2013] heparin 2,500 Units, papaverine 30 mg in electrolyte-148 (PLASMALYTE-148) 500 mL irrigation   Irrigation To OR Kerin Perna, MD      . Melene Muller ON 02/06/2013] heparin 30,000 units/NS 1000 mL solution for CELLSAVER   Other To OR Kerin Perna, MD      . insulin aspart (novoLOG) injection 0-9 Units  0-9 Units Subcutaneous TID WC Dayna N Dunn, PA-C      . [START ON 02/06/2013] insulin regular (NOVOLIN R,HUMULIN R) 1 Units/mL in sodium chloride 0.9 % 100 mL infusion   Intravenous To OR Kerin Perna, MD      . Melene Muller ON 02/06/2013] magnesium sulfate (IV Push/IM) injection 40 mEq  40 mEq Other To OR Kerin Perna, MD      . methocarbamol (ROBAXIN) tablet 750 mg  750 mg Oral Q8H PRN Dayna N Dunn, PA-C      . metoprolol tartrate (LOPRESSOR) tablet 12.5 mg  12.5 mg Oral BID Dayna N Dunn, PA-C      . [START ON 02/06/2013] metoprolol tartrate (LOPRESSOR) tablet 12.5 mg  12.5 mg Oral Once Erin Barrett, PA-C      . nitroGLYCERIN (NITROSTAT) SL tablet 0.4 mg  0.4 mg Sublingual Q5 Min x 3 PRN Dayna N Dunn, PA-C      . [START ON 02/06/2013] nitroGLYCERIN 0.2 mg/mL in dextrose 5 % infusion  2-200 mcg/min Intravenous To OR Kerin Perna, MD      . ondansetron [facility]) injection 4 mg  4 mg Intravenous Q6H PRN Dayna N Dunn, PA-C      . [START ON 02/06/2013] phenylephrine (NEO-SYNEPHRINE) 20,000 mcg in dextrose 5 % 250 mL infusion  30-200 mcg/min Intravenous  To OR Kerin Perna, MD      . Melene Muller ON 02/06/2013]  potassium chloride injection 80 mEq  80 mEq Other To OR Kerin Perna, MD      . temazepam (RESTORIL) capsule 15 mg  15 mg Oral Once PRN Lowella Dandy, PA-C      . [START ON 02/06/2013] vancomycin (VANCOCIN) 1,500 mg in sodium chloride 0.9 % 250 mL IVPB  1,500 mg Intravenous To OR Kerin Perna, MD        Prescriptions prior to admission  Medication Sig Dispense Refill  . amLODipine (NORVASC) 5 MG tablet Take 5 mg by mouth at bedtime.       . benazepril (LOTENSIN) 20 MG tablet Take 20 mg by mouth at bedtime.       . colchicine 0.6 MG tablet Take 0.6 mg by mouth daily as needed (gout).      Marland Kitchen glipiZIDE (GLUCOTROL) 5 MG tablet Take 5 mg by mouth at bedtime.       . methocarbamol (ROBAXIN) 750 MG tablet Take 750 mg by mouth 3 (three) times daily as needed (muscle spasm).      . metoprolol tartrate (LOPRESSOR) 25 MG tablet Take 0.5 tablets (12.5 mg total) by mouth 2 (two) times daily.  30 tablet  6    Family History  Problem Relation Age of Onset  . Heart failure Mother 36  . Cancer Sister      Review of Systems:     Cardiac Review of Systems: Y or N  Chest Pain [   Y. ]  Resting SOB [N.   ] Exertional SOB  [  Y.]  Orthopnea [N.  ]   Pedal Edema [ N.  ]    Palpitations [N.  ] Syncope  [ N. ]   Presyncope [  N. ]  General Review of Systems: [Y] = yes [  ]=no Constitional: recent weight change [  ]; anorexia [  ]; fatigue [  ]; nausea [  ]; night sweats [  ]; fever [  ]; or chills [  ]                                                               Dental: poor dentition[  ]; Last Dentist visit: 1 year  Eye : blurred vision [  ]; diplopia [   ]; vision changes [  ];  Amaurosis fugax[  ]; Resp: cough [  ];  wheezing[  ];  hemoptysis[  ]; shortness of breath[  ]; paroxysmal nocturnal dyspnea[  ]; dyspnea on exertion[ Y. ]; or orthopnea[  ];  GI:  gallstones[  ], vomiting[  ];  dysphagia[  ]; melena[  ];  hematochezia [  ]; heartburn[  ];   Hx of  Colonoscopy[  ]; GU: kidney stones [  ];  hematuria[  ];   dysuria [  ];  nocturia[  ];  history of     obstruction [  ]; urinary frequency [  ]             Skin: rash, swelling[  ];, hair loss[  ];  peripheral edema[  ];  or itching[  ]; Musculosketetal: myalgias[  ];  joint swelling[  ];  joint erythema[  ];  joint pain[  ];  back pain[  ];  Heme/Lymph: bruising[  ];  bleeding[  ];  anemia[  ];  Neuro: TIA[  ];  headaches[  ];  stroke[  ];  vertigo[  ];  seizures[  ];   paresthesias[  ];  difficulty walking[  ];  Psych:depression[  ]; anxiety[  ];  Endocrine: diabetes[ Y. ];  thyroid dysfunction[  ];  Immunizations: Flu [  ]; Pneumococcal[  ];  Other:  Physical Exam: BP 125/67  Pulse 65  Temp(Src) 98.1 F (36.7 C) (Oral)  Resp 18  Ht 5\' 11"  (1.803 m)  Wt 225 lb (102.059 kg)  BMI 31.39 kg/m2  SpO2 97%  Exam Gen.-Middle-aged Caucasian male: Cardiac cath in no acute distress HEENT normocephalic pupils equal dentition good Neck-no JVD mass or bruit Lymphatics-no palpable adenopathy in the cervical or supraclavicular fossa Cardiac-regular rhythm without murmur or gallop Chest-no deformity or tenderness, breath sounds clear bilaterally Abdomen-soft nontender without pulsatile mass Extremities-no clubbing cyanosis edema or tenderness, pressure dressing over right wrist radial artery cath site Vascular-palpable pulses in all extremities no evidence of venous insufficiency Neuro-no focal motor deficit, left-hand-dominant  Diagnostic Studies & Laboratory data:     Recent Radiology Findings:   No results found.    Recent Lab Findings: Lab Results  Component Value Date   WBC 7.1 01/30/2013   HGB 13.6 01/30/2013   HCT 40.3 01/30/2013   PLT 179.0 01/30/2013   GLUCOSE 146* 01/30/2013   NA 138 01/30/2013   K 3.8 01/30/2013   CL 104 01/30/2013   CREATININE 1.1 01/30/2013   BUN 17 01/30/2013   CO2 24 01/30/2013   INR 0.9 01/30/2013      Assessment / Plan:      Exertional but accelerating-unstable angina with severe three-vessel disease  and good LV function  Left main and three-vessel disease  Plan CABG within 24 hours -grafts planned to LAD, circumflex and possibly occluded OM1 and RCA. Procedure indications risks alternatives reviewed with patient and he agrees.      @ME1 @ 02/05/2013 2:48 PM

## 2013-02-05 NOTE — OR Nursing (Signed)
+  Allen's test right hand 

## 2013-02-05 NOTE — OR Nursing (Signed)
Dr [name] at bedside to discuss results and treatment plan with pt and family 

## 2013-02-05 NOTE — Anesthesia Preprocedure Evaluation (Addendum)
Anesthesia Evaluation  Patient identified by MRN, date of birth, ID band Patient awake    Reviewed: Allergy & Precautions, H&P , NPO status , Patient's Chart, lab work & pertinent test results  Airway       Dental   Pulmonary COPD (by CXR)former smoker,          Cardiovascular hypertension, Pt. on medications and Pt. on home beta blockers + angina with exertion + CAD and + Peripheral Vascular Disease  Card Cath 02/05/13 80% left main stenosis 95% ostial LAD stenosis and three-vessel CAD. LVEF is well preserved (>55%).  Right radial cath site   Neuro/Psych Prior ACDF  Carotid dopplers 02/06/13 Carotid Findings:  Right = 40-59% ICA stenosis, highest end of scale (borderline >60%). Left = Occluded ICA. Antegrade vertebral arteries.  negative neurological ROS  negative psych ROS   GI/Hepatic negative GI ROS, Neg liver ROS,   Endo/Other  diabetes, Type 2, Oral Hypoglycemic AgentsMorbid obesityGout  Renal/GU negative Renal ROS  negative genitourinary   Musculoskeletal  (+) Arthritis -, Osteoarthritis,    Abdominal   Peds  Hematology   Anesthesia Other Findings   Reproductive/Obstetrics negative OB ROS                      Anesthesia Physical Anesthesia Plan  ASA: III  Anesthesia Plan: General   Post-op Pain Management:    Induction: Intravenous  Airway Management Planned: Oral ETT  Additional Equipment: Arterial line, CVP, PA Cath and TEE  Intra-op Plan:   Post-operative Plan: Extubation in OR  Informed Consent: I have reviewed the patients History and Physical, chart, labs and discussed the procedure including the risks, benefits and alternatives for the proposed anesthesia with the patient or authorized representative who has indicated his/her understanding and acceptance.     Plan Discussed with: CRNA, Anesthesiologist and Surgeon  Anesthesia Plan Comments:         Anesthesia  Quick Evaluation

## 2013-02-05 NOTE — OR Nursing (Signed)
Meal served 

## 2013-02-05 NOTE — OR Nursing (Signed)
Transported via stretcher, on monitor to 2007 without change

## 2013-02-05 NOTE — Progress Notes (Signed)
Spoke with pt re sternal precautions and mobility post op. Gave book and advised about video. Pt will watch it with wife when she arrives.  [id number] Tony [name] CES, ACSM 2:36 PM 02/05/2013

## 2013-02-05 NOTE — Progress Notes (Signed)
Utilization Review Completed.Rudine Rieger T6/07/2013  

## 2013-02-05 NOTE — CV Procedure (Signed)
   Cardiac Catheterization Procedure Note  Name: Tony [name] MRN: [id number] DOB: 12-12-1940  Procedure: Left Heart Cath, Selective Coronary Angiography, LV angiography  Indication:  Class III angina.    Procedural details: The right radial was prepped, draped, and anesthetized with 1% lidocaine. Using modified Seldinger technique, a 5 French sheath was introduced into the right radial artery. Standard Judkins catheters were used for coronary angiography and left ventriculography. Catheter exchanges were performed over a guidewire. There were no immediate procedural complications. The patient was transferred to the post catheterization recovery area for further monitoring.  Procedural Findings:   Hemodynamics:     AO 131/67    LV 123/17   Coronary angiography:   Coronary dominance:  Co dominant.   Left mainstem:   Heavy calcification.  Ostial 80% under a shelf of calcium  Left anterior descending (LAD):   Osital 95%.  Proximal 60% after D1.  The vessel is large and wraps the apex.  D1 lareg and normal.  D2 small and normal.    Left circumflex (LCx):  AV groove ostial 25%.  Small OM1 normal.  OM2 100% proximal with scant filling.  PDA moderate sized and normal.    Right coronary artery (RCA):  Proximal moderate calcification with 60 - 70% focal stenosis.  Small PDA normal.  Small PL normal.    Left ventriculography: Left ventricular systolic function is normal, LVEF is estimated at 65%, there is no significant mitral regurgitation   Final Conclusions:  3 vessel CAD with preserved EF and class III anginal symptoms.  Recommendations: [facility] consult for CABG.   [name] 02/05/2013, 9:06 AM

## 2013-02-06 ENCOUNTER — Encounter (HOSPITAL_COMMUNITY): Payer: Self-pay | Admitting: Anesthesiology

## 2013-02-06 DIAGNOSIS — I200 Unstable angina: Secondary | ICD-10-CM

## 2013-02-06 DIAGNOSIS — Z01810 Encounter for preprocedural cardiovascular examination: Secondary | ICD-10-CM

## 2013-02-06 DIAGNOSIS — I2510 Atherosclerotic heart disease of native coronary artery without angina pectoris: Secondary | ICD-10-CM

## 2013-02-06 LAB — COMPREHENSIVE METABOLIC PANEL
ALT: 18 U/L (ref 0–53)
AST: 17 U/L (ref 0–37)
Alkaline Phosphatase: 66 U/L (ref 39–117)
Calcium: 8.9 mg/dL (ref 8.4–10.5)
Glucose, Bld: 89 mg/dL (ref 70–99)
Potassium: 3.9 mEq/L (ref 3.5–5.1)
Sodium: 140 mEq/L (ref 135–145)
Total Protein: 6.7 g/dL (ref 6.0–8.3)

## 2013-02-06 LAB — GLUCOSE, CAPILLARY
Glucose-Capillary: 123 mg/dL — ABNORMAL HIGH (ref 70–99)
Glucose-Capillary: 125 mg/dL — ABNORMAL HIGH (ref 70–99)
Glucose-Capillary: 185 mg/dL — ABNORMAL HIGH (ref 70–99)
Glucose-Capillary: 96 mg/dL (ref 70–99)

## 2013-02-06 LAB — CBC
HCT: 37.1 % — ABNORMAL LOW (ref 39.0–52.0)
Hemoglobin: 12.9 g/dL — ABNORMAL LOW (ref 13.0–17.0)
MCH: 29.8 pg (ref 26.0–34.0)
MCHC: 34.8 g/dL (ref 30.0–36.0)
MCV: 85.7 fL (ref 78.0–100.0)
RDW: 13.2 % (ref 11.5–15.5)

## 2013-02-06 LAB — POCT I-STAT GLUCOSE
Glucose, Bld: 129 mg/dL — ABNORMAL HIGH (ref 70–99)
Operator id: 141321

## 2013-02-06 LAB — LIPID PANEL
Cholesterol: 210 mg/dL — ABNORMAL HIGH (ref 0–200)
HDL: 32 mg/dL — ABNORMAL LOW (ref >39)
LDL Cholesterol: 103 mg/dL — ABNORMAL HIGH (ref 0–99)
Total CHOL/HDL Ratio: 6.6 RATIO
Triglycerides: 377 mg/dL — ABNORMAL HIGH (ref <150)
VLDL: 75 mg/dL — ABNORMAL HIGH (ref 0–40)

## 2013-02-06 LAB — HEMOGLOBIN A1C
Hgb A1c MFr Bld: 6.7 % — ABNORMAL HIGH (ref <5.7)
Mean Plasma Glucose: 146 mg/dL — ABNORMAL HIGH (ref <117)

## 2013-02-06 MED ORDER — METOPROLOL TARTRATE 12.5 MG HALF TABLET
12.5000 mg | ORAL_TABLET | Freq: Once | ORAL | Status: AC
  Administered 2013-02-06: 12.5 mg via ORAL
  Filled 2013-02-06: qty 1

## 2013-02-06 MED ORDER — DIAZEPAM 5 MG PO TABS: 5.0000 mg | ORAL_TABLET | Freq: Once | ORAL | Status: DC | PRN

## 2013-02-06 NOTE — Preoperative (Addendum)
Beta Blockers   Reason not to administer Beta Blockers:metoprolol 02/05/13 2053. Metoprolol taken on 02/08/13 @ 2122.

## 2013-02-06 NOTE — Care Management Note (Unsigned)
    Page 1 of 1   02/06/2013     2:59:26 PM   CARE MANAGEMENT NOTE 02/06/2013  Patient:  Tony [name], Tony [name]   Account Number:  [id number]  Date Initiated:  02/06/2013  Documentation initiated by:  Dhruvan Gullion  Subjective/Objective Assessment:   PT ADM ON 02/05/13 WITH 3 VESSEL CAD; FOR CABG TODAY.  PTA, PT RESIDES AT HOME WITH WIFE AND IS INDEPENDENT.     Action/Plan:   WILL FOLLOW FOR DC PLANNING.  WILL ASSESS POST OP FOR HOME NEEDS AS PT PROGRESSES.   Anticipated DC Date:  02/11/2013   Anticipated DC Plan:  HOME W HOME HEALTH SERVICES      DC Planning Services  CM consult      Choice offered to / List presented to:             Status of service:  In process, will continue to follow Medicare Important Message given?   (If response is "NO", the following Medicare IM given date fields will be blank) Date Medicare IM given:   Date Additional Medicare IM given:    Discharge Disposition:    Per UR Regulation:  Reviewed for med. necessity/level of care/duration of stay  If discussed at Long Length of Stay Meetings, dates discussed:    Comments:  02/06/13 Mariaclara Spear,RN,BSN [phone] SURGERY CANCELLED TODAY.

## 2013-02-06 NOTE — Progress Notes (Signed)
TCTS  Due to busy OR schedule Mr Vrba's surgery has been rescheduled to Mon 6-16  Appreciate Dr Candie Chroman consult for occluded R internal carotid artery

## 2013-02-06 NOTE — Consult Note (Signed)
[facility] [place]  New Carotid Patient  Referred by: Kerin Perna, MD   Reason for referral: occluded left and 59% stenosis on the right carotid.  History of Present Illness  Tony [name] is a 72 y.o. (September 26, 1940) male who presents with chief complaint: chest discomfort.  Carotid studies demonstrated: RICA 59% stenosis, LICA 100% stenosis.  Patient has no history of TIA or stroke symptom.  The patient has not had amaurosis fugax or monocular blindness.  The patient has not had facial drooping or hemiplegia.  The patient has not had receptive or expressive aphasia.  The patient's risks factors for carotid disease include: DM, CAD,hypertension and a history of smoking.  He has tried statin drugs in the past and can not tolerate them.  He is currently on Norvasc, benazepril for hypertensionand glipizide for DM.  Past Medical History  Diagnosis Date  . Diabetes mellitus Age 51    Type 2  . Hypertension   . Hyperlipidemia   . Arthritis   . Joint pain   . Leg pain   . Gout   . PVD (peripheral vascular disease)     SFA 50%, stenosis right profunda  . Coronary artery disease   . Anginal pain     Past Surgical History  Procedure Laterality Date  . Back surgery  2010    lumbar  . Cervical spine surgery      History   Social History  . Marital Status: Married    Spouse Name: N/A    Number of Children: N/A  . Years of Education: N/A   Occupational History  . Not on file.   Social History Main Topics  . Smoking status: Former Smoker -- 3.00 packs/day for 40 years    Types: Cigarettes    Quit date: 05/27/1992  . Smokeless tobacco: Never Used  . Alcohol Use: No  . Drug Use: No  . Sexually Active: Not on file   Other Topics Concern  . Not on file   Social History Narrative   Lives at home with wife.           Family History  Problem Relation Age of Onset  . Heart failure Mother 42  . Cancer Sister       No current  facility-administered medications on file prior to encounter.   Current Outpatient Prescriptions on File Prior to Encounter  Medication Sig Dispense Refill  . amLODipine (NORVASC) 5 MG tablet Take 5 mg by mouth at bedtime.       . benazepril (LOTENSIN) 20 MG tablet Take 20 mg by mouth at bedtime.       Marland Kitchen glipiZIDE (GLUCOTROL) 5 MG tablet Take 5 mg by mouth at bedtime.       . metoprolol tartrate (LOPRESSOR) 25 MG tablet Take 0.5 tablets (12.5 mg total) by mouth 2 (two) times daily.  30 tablet  6    Allergies  Allergen Reactions  . Aspirin     Causes gout flare  . Cortisone Nausea And Vomiting    REVIEW OF SYSTEMS:  (Positives checked otherwise negative)  CARDIOVASCULAR:  []  chest pain, [x]  chest pressure, []  palpitations, []  shortness of breath when laying flat, [x]  shortness of breath with exertion,  []  pain in feet when walking, []  pain in feet when laying flat, []  history of blood clot in veins (DVT), []  history of phlebitis, []  swelling in legs, []  varicose veins  PULMONARY:  []  productive cough, []  asthma, []  wheezing  NEUROLOGIC:  []  weakness in arms or legs, []  numbness in arms or legs, []  difficulty speaking or slurred speech, []  temporary loss of vision in one eye, []  dizziness  HEMATOLOGIC:  []  bleeding problems, []  problems with blood clotting too easily  MUSCULOSKEL:  []  joint pain, []  joint swelling  GASTROINTEST:  []  vomiting blood, []  blood in stool     GENITOURINARY:  []  burning with urination, []  blood in urine  PSYCHIATRIC:  []  history of major depression  INTEGUMENTARY:  []  rashes, []  ulcers  CONSTITUTIONAL:  []  fever, []  chills  For VQI Use Only    PRE-ADM LIVING: Home  AMB STATUS: Ambulatory  CAD Sx: None  PRIOR CHF: None  STRESS TEST: [x ] No, [ ]  Normal, [ ]  + ischemia, [ ]  + MI, [ ]  Both   Physical Examination  Filed Vitals:   02/05/13 1200 02/05/13 1400 02/05/13 2023 02/06/13 0421  BP: 134/77 125/67 155/71 140/80  Pulse: 62 65 59 65   Temp: 98.6 F (37 C) 98.1 F (36.7 C) 97.6 F (36.4 C) 98.4 F (36.9 C)  TempSrc: Oral Oral Oral Oral  Resp: 16 18 18 16   Height: 5\' 11"  (1.803 m)     Weight: 225 lb (102.059 kg)     SpO2: 98% 97% 97% 100%   Body mass index is 31.39 kg/(m^2).  General: A&O x 3, WDWN,  Head: [place]/AT,  Ear/Nose/Throat: Hearing grossly intact, nares w/o erythema or drainage, oropharynx w/o Erythema/Exudate,  Eyes: PERRLA, EOMI, Neck: Supple,  Pulmonary: Sym exp, good air movt, CTAB, no rales, rhonchi, & wheezing,  Cardiac: RRR, no Murmurs, rubs or gallops*  Vascular: Vessel Right Left  Radial  Palpable  Palpable  Ulnar  Palpable  Palpable  Brachial  Palpable  Palpable  Carotid  Palpable, without bruit not Palpable, without bruit  Aorta  Not palpable N/A  Femoral  Palpable  Palpable  Popliteal  Not palpable  Not palpable  PT  Palpable  Palpable  DP  Palpable  Palpable   Gastrointestinal: soft, NTND,  - masses,  Musculoskeletal: M/S 5/5 throughout, Extremities without ischemic changes    Neurologic: CN 2-12 intact, Pain and light touch intact in extremities, Motor exam as listed above  Psychiatric: Judgment intact, Mood & affect appropriate for pt's clinical situation, Dermatologic: See M/S exam for extremity exam, no rashes otherwise noted  Lymph : No Cervical, Axillary, or Inguinal lymphadenopathy    Non-Invasive Vascular Imaging  CAROTID DUPLEX (Date: 02/06/2013):   R ICA stenosis: 59%   L ICA stenosis: 100%    Medical Decision Making  [name] is a 72 y.o. male who presents with:Plan for CABG today 02/06/2013.  Left ICA is occluded and right is 59%  ICA stenosis. He will be followed as an out patient.  Plan right carotid duplex in 6 months.  There is no intervention on the left since it is 100% occluded.  Thomasena Edis, EMMA [place] PA-C [facility] [place] Office: [phone]   02/06/2013, 10:42 AM       Degree with the above  assessment Patient has left ICA occlusion of unknown duration with 60% right ICA stenosis Asymptomatic from neurologic standpoint denying lateralizing weakness, a fascia, amaurosis fugax, [place], blurred vision, and syncope. No history of previous CVA  We will follow right ICA stenosis in the office and see patient in 6 months with duplex scan. No indication for combined CABG and right CE Discussed with patient and his wife and they  understand

## 2013-02-06 NOTE — Progress Notes (Signed)
TCTS  Day of Surgery Procedure(s) (LRB): CORONARY ARTERY BYPASS GRAFTING (CABG) (N/A) INTRAOPERATIVE TRANSESOPHAGEAL ECHOCARDIOGRAM (N/A) Subjective: L main 3v CAD No angina overnight  Objective: Vital signs in last 24 hours: Temp:  [97.6 F (36.4 C)-98.6 F (37 C)] 98.4 F (36.9 C) (06/13 0421) Pulse Rate:  [59-72] 65 (06/13 0421) Cardiac Rhythm:  [-] Sinus bradycardia (06/12 1944) Resp:  [16-18] 16 (06/13 0421) BP: (114-166)/(61-89) 140/80 mmHg (06/13 0421) SpO2:  [94 %-100 %] 100 % (06/13 0421) Weight:  [225 lb (102.059 kg)] 225 lb (102.059 kg) (06/12 1200)  Hemodynamic parameters for last 24 hours:  nsr  Intake/Output from previous day: 06/12 0701 - 06/13 0700 In: 1080 [P.O.:1080] Out: 350 [Urine:350] Intake/Output this shift:    Lungs clear No hematoma  Lab Results:  Recent Labs  02/05/13 1627 02/06/13 0425  WBC 7.3 6.9  HGB 13.9 12.9*  HCT 39.4 37.1*  PLT 178 153   BMET:  Recent Labs  02/05/13 1627 02/06/13 0425  NA 137 140  K 4.6 3.9  CL 100 105  CO2 26 25  GLUCOSE 114* 89  BUN 18 18  CREATININE 1.15 1.08  CALCIUM 9.3 8.9    PT/INR:  Recent Labs  02/05/13 1627  LABPROT 12.1  INR 0.90   ABG    Component Value Date/Time   PHART 7.382 02/05/2013 1814   HCO3 22.3 02/05/2013 1814   TCO2 23.4 02/05/2013 1814   ACIDBASEDEF 2.0 02/05/2013 1814   O2SAT 94.5 02/05/2013 1814   CBG (last 3)   Recent Labs  02/05/13 1643 02/05/13 2051 02/06/13 0631  GLUCAP 99 178* 123*    Assessment/Plan: S/P Procedure(s) (LRB): CORONARY ARTERY BYPASS GRAFTING (CABG) (N/A) INTRAOPERATIVE TRANSESOPHAGEAL ECHOCARDIOGRAM (N/A) preop labs ok cabg later tday  The patient was examined and preop studies reviewed. There has been no change from the prior exam and the patient is ready for surgery.     LOS: 1 day    [name],PETER 02/06/2013

## 2013-02-06 NOTE — Progress Notes (Signed)
SUBJECTIVE:  No chest pain, no SOB, ready for CABG   PHYSICAL EXAM Filed Vitals:   02/05/13 1200 02/05/13 1400 02/05/13 2023 02/06/13 0421  BP: 134/77 125/67 155/71 140/80  Pulse: 62 65 59 65  Temp: 98.6 F (37 C) 98.1 F (36.7 C) 97.6 F (36.4 C) 98.4 F (36.9 C)  TempSrc: Oral Oral Oral Oral  Resp: 16 18 18 16   Height: 5\' 11"  (1.803 m)     Weight: 225 lb (102.059 kg)     SpO2: 98% 97% 97% 100%   General: Well developed, well nourished, male in no acute distress Head: Eyes PERRLA, No xanthomas.   Normocephalic and atraumatic  Lungs: Clear bilaterally to auscultation. Heart: HRRR S1 S2, without MRG.  Pulses are 2+ & equal.  No JVD.   Abdomen: Bowel sounds are present, abdomen soft and non-tender without masses or  hernias noted. Msk: Normal strength and tone for age. Extremities: No clubbing, cyanosis or edema.    Skin:  No rashes or lesions noted. Neuro: Alert and oriented X 3. Psych:  Good affect, responds appropriately  LABS: Results for orders placed during the hospital encounter of 02/05/13 (from the past 24 hour(s))  GLUCOSE, CAPILLARY     Status: Abnormal   Collection Time    02/05/13 12:01 PM      Result Value Range   Glucose-Capillary 171 (*) 70 - 99 mg/dL   Comment 1 Notify RN    URINALYSIS, ROUTINE W REFLEX MICROSCOPIC     Status: None   Collection Time    02/05/13  4:01 PM      Result Value Range   Color, Urine YELLOW  YELLOW   APPearance CLEAR  CLEAR   Specific Gravity, Urine 1.020  1.005 - 1.030   pH 6.5  5.0 - 8.0   Glucose, UA NEGATIVE  NEGATIVE mg/dL   Hgb urine dipstick NEGATIVE  NEGATIVE   Bilirubin Urine NEGATIVE  NEGATIVE   Ketones, ur NEGATIVE  NEGATIVE mg/dL   Protein, ur NEGATIVE  NEGATIVE mg/dL   Urobilinogen, UA 0.2  0.0 - 1.0 mg/dL   Nitrite NEGATIVE  NEGATIVE   Leukocytes, UA NEGATIVE  NEGATIVE  TYPE AND SCREEN     Status: None   Collection Time    02/05/13  4:26 PM      Result Value Range   ABO/RH(D) O POS     Antibody Screen  NEG     Sample Expiration 02/08/2013     Unit Number [id number]     Blood Component Type RED CELLS,LR     Unit division 00     Status of Unit ALLOCATED     Transfusion Status OK TO TRANSFUSE     Crossmatch Result Compatible     Unit Number [id number]     Blood Component Type RED CELLS,LR     Unit division 00     Status of Unit ALLOCATED     Transfusion Status OK TO TRANSFUSE     Crossmatch Result Compatible    ABO/RH     Status: None   Collection Time    02/05/13  4:26 PM      Result Value Range   ABO/RH(D) O POS    COMPREHENSIVE METABOLIC PANEL     Status: Abnormal   Collection Time    02/05/13  4:27 PM      Result Value Range   Sodium 137  135 - 145 mEq/L   Potassium 4.6  3.5 - 5.1 mEq/L  Chloride 100  96 - 112 mEq/L   CO2 26  19 - 32 mEq/L   Glucose, Bld 114 (*) 70 - 99 mg/dL   BUN 18  6 - 23 mg/dL   Creatinine, Ser 6.38  0.50 - 1.35 mg/dL   Calcium 9.3  8.4 - 75.6 mg/dL   Total Protein 7.6  6.0 - 8.3 g/dL   Albumin 4.0  3.5 - 5.2 g/dL   AST 15  0 - 37 U/L   ALT 20  0 - 53 U/L   Alkaline Phosphatase 77  39 - 117 U/L   Total Bilirubin 0.3  0.3 - 1.2 mg/dL   GFR calc non Af Amer 63 (*) >90 mL/min   GFR calc Af Amer 73 (*) >90 mL/min  PROTIME-INR     Status: None   Collection Time    02/05/13  4:27 PM      Result Value Range   Prothrombin Time 12.1  11.6 - 15.2 seconds   INR 0.90  0.00 - 1.49  APTT     Status: None   Collection Time    02/05/13  4:27 PM      Result Value Range   aPTT 30  24 - 37 seconds  CBC     Status: None   Collection Time    02/05/13  4:27 PM      Result Value Range   WBC 7.3  4.0 - 10.5 K/uL   RBC 4.61  4.22 - 5.81 MIL/uL   Hemoglobin 13.9  13.0 - 17.0 g/dL   HCT 43.3  29.5 - 18.8 %   MCV 85.5  78.0 - 100.0 fL   MCH 30.2  26.0 - 34.0 pg   MCHC 35.3  30.0 - 36.0 g/dL   RDW 41.6  60.6 - 30.1 %   Platelets 178  150 - 400 K/uL  HEMOGLOBIN A1C     Status: Abnormal   Collection Time    02/05/13  4:27 PM      Result Value Range     Hemoglobin A1C 6.5 (*) <5.7 %   Mean Plasma Glucose 140 (*) <117 mg/dL  SURGICAL PCR SCREEN     Status: None   Collection Time    02/05/13  4:41 PM      Result Value Range   MRSA, PCR NEGATIVE  NEGATIVE   Staphylococcus aureus NEGATIVE  NEGATIVE  GLUCOSE, CAPILLARY     Status: None   Collection Time    02/05/13  4:43 PM      Result Value Range   Glucose-Capillary 99  70 - 99 mg/dL   Comment 1 Documented in Chart     Comment 2 Notify RN    BLOOD GAS, ARTERIAL     Status: Abnormal   Collection Time    02/05/13  6:14 PM      Result Value Range   FIO2 0.21     pH, Arterial 7.382  7.350 - 7.450   pCO2 arterial 38.3  35.0 - 45.0 mmHg   pO2, Arterial 73.5 (*) 80.0 - 100.0 mmHg   Bicarbonate 22.3  20.0 - 24.0 mEq/L   TCO2 23.4  0 - 100 mmol/L   Acid-base deficit 2.0  0.0 - 2.0 mmol/L   O2 Saturation 94.5     Patient temperature 98.6     Collection site LEFT RADIAL     Drawn by [id number]     Sample type ARTERIAL DRAW     Allens test (pass/fail) PASS  PASS  GLUCOSE, CAPILLARY     Status: Abnormal   Collection Time    02/05/13  8:51 PM      Result Value Range   Glucose-Capillary 178 (*) 70 - 99 mg/dL  COMPREHENSIVE METABOLIC PANEL     Status: Abnormal   Collection Time    02/06/13  4:25 AM      Result Value Range   Sodium 140  135 - 145 mEq/L   Potassium 3.9  3.5 - 5.1 mEq/L   Chloride 105  96 - 112 mEq/L   CO2 25  19 - 32 mEq/L   Glucose, Bld 89  70 - 99 mg/dL   BUN 18  6 - 23 mg/dL   Creatinine, Ser 1.61  0.50 - 1.35 mg/dL   Calcium 8.9  8.4 - 09.6 mg/dL   Total Protein 6.7  6.0 - 8.3 g/dL   Albumin 3.6  3.5 - 5.2 g/dL   AST 17  0 - 37 U/L   ALT 18  0 - 53 U/L   Alkaline Phosphatase 66  39 - 117 U/L   Total Bilirubin 0.5  0.3 - 1.2 mg/dL   GFR calc non Af Amer 68 (*) >90 mL/min   GFR calc Af Amer 78 (*) >90 mL/min  CBC     Status: Abnormal   Collection Time    02/06/13  4:25 AM      Result Value Range   WBC 6.9  4.0 - 10.5 K/uL   RBC 4.33  4.22 - 5.81 MIL/uL    Hemoglobin 12.9 (*) 13.0 - 17.0 g/dL   HCT 04.5 (*) 40.9 - 81.1 %   MCV 85.7  78.0 - 100.0 fL   MCH 29.8  26.0 - 34.0 pg   MCHC 34.8  30.0 - 36.0 g/dL   RDW 91.4  78.2 - 95.6 %   Platelets 153  150 - 400 K/uL  LIPID PANEL     Status: Abnormal   Collection Time    02/06/13  4:25 AM      Result Value Range   Cholesterol 210 (*) 0 - 200 mg/dL   Triglycerides 213 (*) <150 mg/dL   HDL 32 (*) >08 mg/dL   Total CHOL/HDL Ratio 6.6     VLDL 75 (*) 0 - 40 mg/dL   LDL Cholesterol 657 (*) 0 - 99 mg/dL  PREPARE RBC (CROSSMATCH)     Status: None   Collection Time    02/06/13  6:00 AM      Result Value Range   Order Confirmation ORDER PROCESSED BY BLOOD BANK    GLUCOSE, CAPILLARY     Status: Abnormal   Collection Time    02/06/13  6:31 AM      Result Value Range   Glucose-Capillary 123 (*) 70 - 99 mg/dL    Intake/Output Summary (Last 24 hours) at 02/06/13 0721 Last data filed at 02/05/13 2243  Gross per 24 hour  Intake   1080 ml  Output    350 ml  Net    730 ml    ASSESSMENT AND PLAN:  Principal Problem:   Unstable angina - cath with severe 3v CAD, CABG today per TCTS.     Bjorn Loser Barrett 02/06/2013 7:21 AM  History and all data above reviewed.  Patient examined.  I agree with the findings as above.  No active chest pain.  The patient exam reveals COR:RRR  ,  Lungs: Clear  ,  Abd: Positive bowel sounds, no rebound  no guarding, Ext Right wrist without erythema, mild bruising.  .  All available labs, radiology testing, previous records reviewed. Agree with documented assessment and plan. CABG today.    [name]  8:13 AM  02/06/2013

## 2013-02-06 NOTE — Progress Notes (Signed)
Pre-op Cardiac Surgery  Carotid Findings:  Right = 40-59% ICA stenosis, highest end of scale (borderline >60%). Left = Occluded ICA. Antegrade vertebral arteries.   Upper Extremity Right Left  Brachial Pressures 133 147  Radial Waveforms Tri Tri  Ulnar Waveforms Tri Tri  Palmar Arch (Allen's Test) Normal with radial compression, decreases >50% with ulnar compression Normal with radial compression, decreases with ulnar compression but not >50%    Lower  Extremity Right Left  Dorsalis Pedis 112, Bi 129, Tri  Anterior Tibial    Posterior Tibial 120, Bi 141, Tri  Ankle/Brachial Indices 0.82, mild arterial disease 0.96, within normal limits   Unchanged from previous ABI - 11/2011.    Farrel Demark, RDMS, RVT 02/06/2013

## 2013-02-06 NOTE — Progress Notes (Signed)
Inpatient Diabetes Program Recommendations  AACE/ADA: New Consensus Statement on Inpatient Glycemic Control (2013)  Target Ranges:  Prepandial:   less than 140 mg/dL      Peak postprandial:   less than 180 mg/dL (1-2 hours)      Critically ill patients:  140 - 180 mg/dL   Inpatient Diabetes Program Recommendations Oral Agents: Discontinue oral agent Glucotrol during hospitalization.   Thank you  Piedad Climes BSN, RN,CDE Inpatient Diabetes Coordinator [phone] (team pager)

## 2013-02-07 DIAGNOSIS — I519 Heart disease, unspecified: Secondary | ICD-10-CM

## 2013-02-07 LAB — CBC
HCT: 39 % (ref 39.0–52.0)
Hemoglobin: 13.5 g/dL (ref 13.0–17.0)
MCH: 29.5 pg (ref 26.0–34.0)
MCHC: 34.6 g/dL (ref 30.0–36.0)
MCV: 85.3 fL (ref 78.0–100.0)
Platelets: 165 10*3/uL (ref 150–400)
RBC: 4.57 MIL/uL (ref 4.22–5.81)
RDW: 13.2 % (ref 11.5–15.5)
WBC: 7.7 10*3/uL (ref 4.0–10.5)

## 2013-02-07 LAB — GLUCOSE, CAPILLARY
Glucose-Capillary: 123 mg/dL — ABNORMAL HIGH (ref 70–99)
Glucose-Capillary: 137 mg/dL — ABNORMAL HIGH (ref 70–99)

## 2013-02-07 MED ORDER — CHLORHEXIDINE GLUCONATE 4 % EX LIQD
60.0000 mL | Freq: Once | CUTANEOUS | Status: DC
  Filled 2013-02-07: qty 60

## 2013-02-07 MED ORDER — CHLORHEXIDINE GLUCONATE 4 % EX LIQD
60.0000 mL | Freq: Once | CUTANEOUS | Status: AC
  Administered 2013-02-08: 4 via TOPICAL
  Filled 2013-02-07 (×2): qty 60

## 2013-02-07 MED ORDER — HEPARIN SODIUM (PORCINE) 5000 UNIT/ML IJ SOLN
5000.0000 [IU] | Freq: Three times a day (TID) | INTRAMUSCULAR | Status: DC
  Administered 2013-02-07 – 2013-02-08 (×3): 5000 [IU] via SUBCUTANEOUS
  Filled 2013-02-07 (×9): qty 1

## 2013-02-07 MED ORDER — TEMAZEPAM 15 MG PO CAPS: 15.0000 mg | ORAL_CAPSULE | Freq: Once | ORAL | Status: AC | PRN

## 2013-02-07 NOTE — Progress Notes (Signed)
  Echocardiogram 2D Echocardiogram has been performed.  Tony [name] 02/07/2013, 11:25 AM

## 2013-02-07 NOTE — Progress Notes (Signed)
   SUBJECTIVE:  No chest pain, no SOB   PHYSICAL EXAM Filed Vitals:   02/06/13 1347 02/06/13 2144 02/06/13 2145 02/07/13 0552  BP: 156/75 162/82 162/82 135/72  Pulse: 64  64 57  Temp:   97.4 F (36.3 C) 97.8 F (36.6 C)  TempSrc:   Oral Oral  Resp:   18 18  Height:      Weight:    224 lb 6.9 oz (101.8 kg)  SpO2:   100% 98%   General: Well developed, well nourished, male in no acute distress Head: Normal Lungs: Clear bilaterally to auscultation. Heart: RRR Abdomen: Soft and non-tender Extremities: No edema.    Neuro: Alert and oriented X 3. Psych:  Good affect, responds appropriately  LABS: Results for orders placed during the hospital encounter of 02/05/13 (from the past 24 hour(s))  GLUCOSE, CAPILLARY     Status: Abnormal   Collection Time    02/06/13 11:16 AM      Result Value Range   Glucose-Capillary 125 (*) 70 - 99 mg/dL   Comment 1 Documented in Chart     Comment 2 Notify RN    GLUCOSE, CAPILLARY     Status: Abnormal   Collection Time    02/06/13  4:37 PM      Result Value Range   Glucose-Capillary 185 (*) 70 - 99 mg/dL   Comment 1 Documented in Chart     Comment 2 Notify RN    GLUCOSE, CAPILLARY     Status: None   Collection Time    02/06/13  9:39 PM      Result Value Range   Glucose-Capillary 96  70 - 99 mg/dL  GLUCOSE, CAPILLARY     Status: Abnormal   Collection Time    02/07/13  5:58 AM      Result Value Range   Glucose-Capillary 123 (*) 70 - 99 mg/dL    Intake/Output Summary (Last 24 hours) at 02/07/13 1002 Last data filed at 02/06/13 1600  Gross per 24 hour  Intake    240 ml  Output      0 ml  Net    240 ml    ASSESSMENT AND PLAN:  Principal Problem: 1 UA- cath with severe 3v CAD, CABG Monday per TCTS. Continue lopressor; ASA allergy; DC statin as patient with myalgias in past 2 cerebrovascular disease-followup vascular surgery after discharge. 3 hypertension-continue present medications.    Tony [name] 02/07/2013 10:02 AM

## 2013-02-08 LAB — BASIC METABOLIC PANEL
BUN: 16 mg/dL (ref 6–23)
CO2: 25 mEq/L (ref 19–32)
Calcium: 9.2 mg/dL (ref 8.4–10.5)
Chloride: 101 mEq/L (ref 96–112)
Creatinine, Ser: 0.98 mg/dL (ref 0.50–1.35)
GFR calc Af Amer: >90 mL/min (ref >90)
GFR calc non Af Amer: 81 mL/min — ABNORMAL LOW (ref >90)
Glucose, Bld: 139 mg/dL — ABNORMAL HIGH (ref 70–99)
Potassium: 3.4 mEq/L — ABNORMAL LOW (ref 3.5–5.1)
Sodium: 134 mEq/L — ABNORMAL LOW (ref 135–145)

## 2013-02-08 LAB — GLUCOSE, CAPILLARY: Glucose-Capillary: 102 mg/dL — ABNORMAL HIGH (ref 70–99)

## 2013-02-08 MED ORDER — SODIUM CHLORIDE 0.9 % IV SOLN
INTRAVENOUS | Status: AC
  Administered 2013-02-09: 69.8 mL/h via INTRAVENOUS
  Filled 2013-02-08: qty 40

## 2013-02-08 MED ORDER — POTASSIUM CHLORIDE 2 MEQ/ML IV SOLN
80.0000 meq | INTRAVENOUS | Status: DC
  Filled 2013-02-08: qty 40

## 2013-02-08 MED ORDER — DEXMEDETOMIDINE HCL IN NACL 400 MCG/100ML IV SOLN
0.1000 ug/kg/h | INTRAVENOUS | Status: AC
  Administered 2013-02-09: 0.3 ug/kg/h via INTRAVENOUS
  Filled 2013-02-08: qty 100

## 2013-02-08 MED ORDER — DOPAMINE-DEXTROSE 3.2-5 MG/ML-% IV SOLN
2.0000 ug/kg/min | INTRAVENOUS | Status: DC
  Filled 2013-02-08: qty 250

## 2013-02-08 MED ORDER — SODIUM CHLORIDE 0.9 % IV SOLN
INTRAVENOUS | Status: AC
  Administered 2013-02-09: 1 [IU]/h via INTRAVENOUS
  Filled 2013-02-08: qty 1

## 2013-02-08 MED ORDER — MAGNESIUM SULFATE 50 % IJ SOLN
40.0000 meq | INTRAMUSCULAR | Status: DC
  Filled 2013-02-08: qty 10

## 2013-02-08 MED ORDER — DEXTROSE 5 % IV SOLN
750.0000 mg | INTRAVENOUS | Status: DC
  Filled 2013-02-08: qty 750

## 2013-02-08 MED ORDER — PHENYLEPHRINE HCL 10 MG/ML IJ SOLN
30.0000 ug/min | INTRAVENOUS | Status: DC
  Filled 2013-02-08: qty 2

## 2013-02-08 MED ORDER — DEXTROSE 5 % IV SOLN
1.5000 g | INTRAVENOUS | Status: AC
  Administered 2013-02-09: 1.5 g via INTRAVENOUS
  Administered 2013-02-09: .75 g via INTRAVENOUS
  Filled 2013-02-08: qty 1.5

## 2013-02-08 MED ORDER — SODIUM CHLORIDE 0.9 % IV SOLN
INTRAVENOUS | Status: DC
  Filled 2013-02-08: qty 1

## 2013-02-08 MED ORDER — NITROGLYCERIN IN D5W 200-5 MCG/ML-% IV SOLN
2.0000 ug/min | INTRAVENOUS | Status: AC
  Administered 2013-02-09: 5 ug/min via INTRAVENOUS
  Filled 2013-02-08: qty 250

## 2013-02-08 MED ORDER — VANCOMYCIN HCL 10 G IV SOLR
1500.0000 mg | INTRAVENOUS | Status: AC
  Administered 2013-02-09: 1500 mg via INTRAVENOUS
  Filled 2013-02-08 (×2): qty 1500

## 2013-02-08 MED ORDER — PLASMA-LYTE 148 IV SOLN
INTRAVENOUS | Status: DC
  Filled 2013-02-08: qty 2.5

## 2013-02-08 MED ORDER — PLASMA-LYTE 148 IV SOLN
INTRAVENOUS | Status: AC
  Administered 2013-02-09: 09:00:00
  Filled 2013-02-08: qty 2.5

## 2013-02-08 MED ORDER — NITROGLYCERIN IN D5W 200-5 MCG/ML-% IV SOLN: 2.0000 ug/min | INTRAVENOUS | Status: DC

## 2013-02-08 MED ORDER — POTASSIUM CHLORIDE CRYS ER 20 MEQ PO TBCR
40.0000 meq | EXTENDED_RELEASE_TABLET | Freq: Once | ORAL | Status: AC
  Administered 2013-02-08: 40 meq via ORAL
  Filled 2013-02-08: qty 2

## 2013-02-08 MED ORDER — SODIUM CHLORIDE 0.9 % IV SOLN
INTRAVENOUS | Status: DC
  Filled 2013-02-08: qty 30

## 2013-02-08 MED ORDER — EPINEPHRINE HCL 1 MG/ML IJ SOLN
0.5000 ug/min | INTRAVENOUS | Status: DC
  Filled 2013-02-08: qty 4

## 2013-02-08 MED ORDER — VANCOMYCIN HCL 10 G IV SOLR
1250.0000 mg | INTRAVENOUS | Status: DC
  Filled 2013-02-08: qty 1250

## 2013-02-08 MED ORDER — DEXMEDETOMIDINE HCL IN NACL 400 MCG/100ML IV SOLN
0.1000 ug/kg/h | INTRAVENOUS | Status: DC
  Filled 2013-02-08: qty 100

## 2013-02-08 MED ORDER — DEXTROSE 5 % IV SOLN
1.5000 g | INTRAVENOUS | Status: DC
  Filled 2013-02-08: qty 1.5

## 2013-02-08 MED ORDER — SODIUM CHLORIDE 0.9 % IV SOLN
INTRAVENOUS | Status: DC
  Filled 2013-02-08: qty 40

## 2013-02-08 MED ORDER — DOPAMINE-DEXTROSE 3.2-5 MG/ML-% IV SOLN
2.0000 ug/kg/min | INTRAVENOUS | Status: AC
  Administered 2013-02-09: 2 ug/kg/min via INTRAVENOUS
  Filled 2013-02-08: qty 250

## 2013-02-08 MED ORDER — SODIUM CHLORIDE 0.9 % IV SOLN: INTRAVENOUS | Status: DC

## 2013-02-08 NOTE — Progress Notes (Signed)
TCTS BRIEF PROGRESS NOTE   Clinically stable.  No chest pain.  No SOB For CABG tomorrow per Dr Zenaida Niece Maudie Flakes  [facility] 02/08/2013 11:21 AM

## 2013-02-08 NOTE — Progress Notes (Signed)
SUBJECTIVE:  No chest pain, no SOB   PHYSICAL EXAM Filed Vitals:   02/07/13 0552 02/07/13 1400 02/07/13 1957 02/08/13 0413  BP: 135/72 134/59 153/77 130/70  Pulse: 57 57 64 51  Temp: 97.8 F (36.6 C) 97.9 F (36.6 C) 98.3 F (36.8 C) 97.3 F (36.3 C)  TempSrc: Oral Oral Oral Oral  Resp: 18 18 18 18   Height:      Weight: 224 lb 6.9 oz (101.8 kg)   223 lb (101.152 kg)  SpO2: 98% 98% 98% 97%   General: Well developed, well nourished, male in no acute distress Head: Normal Lungs: Clear bilaterally to auscultation. Heart: RRR Abdomen: Soft and non-tender Extremities: No edema.    Neuro: Alert and oriented X 3. Psych:  Good affect, responds appropriately  LABS: Results for orders placed during the hospital encounter of 02/05/13 (from the past 24 hour(s))  GLUCOSE, CAPILLARY     Status: Abnormal   Collection Time    02/07/13 11:24 AM      Result Value Range   Glucose-Capillary 137 (*) 70 - 99 mg/dL  GLUCOSE, CAPILLARY     Status: Abnormal   Collection Time    02/07/13  4:29 PM      Result Value Range   Glucose-Capillary 138 (*) 70 - 99 mg/dL  GLUCOSE, CAPILLARY     Status: Abnormal   Collection Time    02/07/13 10:04 PM      Result Value Range   Glucose-Capillary 114 (*) 70 - 99 mg/dL  CBC     Status: None   Collection Time    02/07/13 11:30 PM      Result Value Range   WBC 7.7  4.0 - 10.5 K/uL   RBC 4.57  4.22 - 5.81 MIL/uL   Hemoglobin 13.5  13.0 - 17.0 g/dL   HCT 16.1  09.6 - 04.5 %   MCV 85.3  78.0 - 100.0 fL   MCH 29.5  26.0 - 34.0 pg   MCHC 34.6  30.0 - 36.0 g/dL   RDW 40.9  81.1 - 91.4 %   Platelets 165  150 - 400 K/uL  BASIC METABOLIC PANEL     Status: Abnormal   Collection Time    02/07/13 11:30 PM      Result Value Range   Sodium 134 (*) 135 - 145 mEq/L   Potassium 3.4 (*) 3.5 - 5.1 mEq/L   Chloride 101  96 - 112 mEq/L   CO2 25  19 - 32 mEq/L   Glucose, Bld 139 (*) 70 - 99 mg/dL   BUN 16  6 - 23 mg/dL   Creatinine, Ser 7.82  0.50 - 1.35 mg/dL    Calcium 9.2  8.4 - 95.6 mg/dL   GFR calc non Af Amer 81 (*) >90 mL/min   GFR calc Af Amer >90  >90 mL/min  GLUCOSE, CAPILLARY     Status: Abnormal   Collection Time    02/08/13  6:15 AM      Result Value Range   Glucose-Capillary 102 (*) 70 - 99 mg/dL    Intake/Output Summary (Last 24 hours) at 02/08/13 0746 Last data filed at 02/08/13 0300  Gross per 24 hour  Intake    480 ml  Output    100 ml  Net    380 ml    ASSESSMENT AND PLAN:  Principal Problem: 1 UA- cath with severe 3v CAD, CABG Monday per TCTS. Continue lopressor; ASA allergy; no statin as patient  with myalgias in past 2 cerebrovascular disease-followup vascular surgery after discharge. 3 hypertension-continue present medications. 4 Hypokalemia - supplement    Tony [name] 02/08/2013 7:46 AM

## 2013-02-09 ENCOUNTER — Encounter (HOSPITAL_COMMUNITY)
Admission: AD | Disposition: A | Discharge status: Home / Self Care | Payer: Self-pay | Source: Ambulatory Visit | Attending: Cardiothoracic Surgery

## 2013-02-09 ENCOUNTER — Encounter (HOSPITAL_COMMUNITY): Payer: Self-pay | Admitting: Anesthesiology

## 2013-02-09 ENCOUNTER — Inpatient Hospital Stay (HOSPITAL_COMMUNITY): Payer: Medicare Other | Admitting: Anesthesiology

## 2013-02-09 ENCOUNTER — Inpatient Hospital Stay (HOSPITAL_COMMUNITY): Payer: Medicare Other

## 2013-02-09 DIAGNOSIS — I2510 Atherosclerotic heart disease of native coronary artery without angina pectoris: Secondary | ICD-10-CM

## 2013-02-09 DIAGNOSIS — Z951 Presence of aortocoronary bypass graft: Secondary | ICD-10-CM

## 2013-02-09 HISTORY — PX: INTRAOPERATIVE TRANSESOPHAGEAL ECHOCARDIOGRAM: SHX5062

## 2013-02-09 HISTORY — DX: Presence of aortocoronary bypass graft: Z95.1

## 2013-02-09 HISTORY — PX: CORONARY ARTERY BYPASS GRAFT: SHX141

## 2013-02-09 LAB — POCT I-STAT 3, ART BLOOD GAS (G3+)
Acid-base deficit: 2 mmol/L (ref 0.0–2.0)
Acid-base deficit: 3 mmol/L — ABNORMAL HIGH (ref 0.0–2.0)
Acid-base deficit: 3 mmol/L — ABNORMAL HIGH (ref 0.0–2.0)
Bicarbonate: 26 mEq/L — ABNORMAL HIGH (ref 20.0–24.0)
Bicarbonate: 22.4 mEq/L (ref 20.0–24.0)
Bicarbonate: 22.2 mEq/L (ref 20.0–24.0)
Bicarbonate: 20.6 mEq/L (ref 20.0–24.0)
Bicarbonate: 21.8 mEq/L (ref 20.0–24.0)
O2 Saturation: 100 %
O2 Saturation: 100 %
O2 Saturation: 95 %
O2 Saturation: 96 %
Patient temperature: 36.2
TCO2: 24 mmol/L (ref 0–100)
TCO2: 27 mmol/L (ref 0–100)
TCO2: 22 mmol/L (ref 0–100)
TCO2: 23 mmol/L (ref 0–100)
pCO2 arterial: 47.8 mmHg — ABNORMAL HIGH (ref 35.0–45.0)
pCO2 arterial: 35.5 mmHg (ref 35.0–45.0)
pCO2 arterial: 50.7 mmHg — ABNORMAL HIGH (ref 35.0–45.0)
pCO2 arterial: 32.8 mmHg — ABNORMAL LOW (ref 35.0–45.0)
pCO2 arterial: 38.6 mmHg (ref 35.0–45.0)
pH, Arterial: 7.303 — ABNORMAL LOW (ref 7.350–7.450)
pH, Arterial: 7.404 (ref 7.350–7.450)
pO2, Arterial: 405 mmHg — ABNORMAL HIGH (ref 80.0–100.0)
pO2, Arterial: 285 mmHg — ABNORMAL HIGH (ref 80.0–100.0)
pO2, Arterial: 242 mmHg — ABNORMAL HIGH (ref 80.0–100.0)
pO2, Arterial: 75 mmHg — ABNORMAL LOW (ref 80.0–100.0)
pO2, Arterial: 108 mmHg — ABNORMAL HIGH (ref 80.0–100.0)
pO2, Arterial: 87 mmHg (ref 80.0–100.0)

## 2013-02-09 LAB — CBC
HCT: 33.1 % — ABNORMAL LOW (ref 39.0–52.0)
HCT: 33.5 % — ABNORMAL LOW (ref 39.0–52.0)
Hemoglobin: 11.6 g/dL — ABNORMAL LOW (ref 13.0–17.0)
Hemoglobin: 11.6 g/dL — ABNORMAL LOW (ref 13.0–17.0)
MCH: 29.5 pg (ref 26.0–34.0)
MCH: 29.1 pg (ref 26.0–34.0)
MCHC: 35 g/dL (ref 30.0–36.0)
MCHC: 34.6 g/dL (ref 30.0–36.0)
MCV: 84 fL (ref 78.0–100.0)
Platelets: 144 10*3/uL — ABNORMAL LOW (ref 150–400)
RBC: 3.99 MIL/uL — ABNORMAL LOW (ref 4.22–5.81)
RDW: 13 % (ref 11.5–15.5)
WBC: 16 10*3/uL — ABNORMAL HIGH (ref 4.0–10.5)

## 2013-02-09 LAB — HEMOGLOBIN AND HEMATOCRIT, BLOOD
HCT: 33.1 % — ABNORMAL LOW (ref 39.0–52.0)
Hemoglobin: 11.6 g/dL — ABNORMAL LOW (ref 13.0–17.0)

## 2013-02-09 LAB — POCT I-STAT 4, (NA,K, GLUC, HGB,HCT)
Glucose, Bld: 128 mg/dL — ABNORMAL HIGH (ref 70–99)
Glucose, Bld: 171 mg/dL — ABNORMAL HIGH (ref 70–99)
Glucose, Bld: 152 mg/dL — ABNORMAL HIGH (ref 70–99)
HCT: 41 % (ref 39.0–52.0)
HCT: 28 % — ABNORMAL LOW (ref 39.0–52.0)
HCT: 38 % — ABNORMAL LOW (ref 39.0–52.0)
HCT: 35 % — ABNORMAL LOW (ref 39.0–52.0)
Hemoglobin: 13.9 g/dL (ref 13.0–17.0)
Hemoglobin: 12.9 g/dL — ABNORMAL LOW (ref 13.0–17.0)
Hemoglobin: 12.2 g/dL — ABNORMAL LOW (ref 13.0–17.0)
Hemoglobin: 11.9 g/dL — ABNORMAL LOW (ref 13.0–17.0)
Hemoglobin: 12.6 g/dL — ABNORMAL LOW (ref 13.0–17.0)
Potassium: 4.3 mEq/L (ref 3.5–5.1)
Potassium: 5.2 mEq/L — ABNORMAL HIGH (ref 3.5–5.1)
Sodium: 138 mEq/L (ref 135–145)
Sodium: 139 mEq/L (ref 135–145)
Sodium: 139 mEq/L (ref 135–145)
Sodium: 138 mEq/L (ref 135–145)

## 2013-02-09 LAB — CREATININE, SERUM
Creatinine, Ser: 0.85 mg/dL (ref 0.50–1.35)
GFR calc Af Amer: >90 mL/min (ref >90)
GFR calc non Af Amer: 86 mL/min — ABNORMAL LOW (ref >90)

## 2013-02-09 LAB — POCT I-STAT, CHEM 8
BUN: 15 mg/dL (ref 6–23)
Chloride: 106 mEq/L (ref 96–112)
Creatinine, Ser: 0.8 mg/dL (ref 0.50–1.35)
Glucose, Bld: 143 mg/dL — ABNORMAL HIGH (ref 70–99)
HCT: 34 % — ABNORMAL LOW (ref 39.0–52.0)
Potassium: 4.1 mEq/L (ref 3.5–5.1)

## 2013-02-09 LAB — TYPE AND SCREEN

## 2013-02-09 LAB — PLATELET COUNT: Platelets: 156 10*3/uL (ref 150–400)

## 2013-02-09 LAB — GLUCOSE, CAPILLARY: Glucose-Capillary: 112 mg/dL — ABNORMAL HIGH (ref 70–99)

## 2013-02-09 LAB — MAGNESIUM: Magnesium: 2.5 mg/dL (ref 1.5–2.5)

## 2013-02-09 SURGERY — CORONARY ARTERY BYPASS GRAFTING (CABG)
Anesthesia: General | Site: Chest | Wound class: Clean

## 2013-02-09 MED ORDER — ACETAMINOPHEN 160 MG/5ML PO SOLN
975.0000 mg | Freq: Four times a day (QID) | ORAL | Status: DC
  Administered 2013-02-09: 975 mg
  Filled 2013-02-09: qty 20.3

## 2013-02-09 MED ORDER — BISACODYL 5 MG PO TBEC
10.0000 mg | DELAYED_RELEASE_TABLET | Freq: Every day | ORAL | Status: DC
  Administered 2013-02-10 – 2013-02-14 (×5): 10 mg via ORAL
  Filled 2013-02-09 (×5): qty 2

## 2013-02-09 MED ORDER — METOPROLOL TARTRATE 1 MG/ML IV SOLN: 2.5000 mg | INTRAVENOUS | Status: DC | PRN

## 2013-02-09 MED ORDER — VECURONIUM BROMIDE 10 MG IV SOLR
INTRAVENOUS | Status: DC | PRN
  Administered 2013-02-09 (×2): 5 mg via INTRAVENOUS
  Administered 2013-02-09: 10 mg via INTRAVENOUS

## 2013-02-09 MED ORDER — SODIUM CHLORIDE 0.45 % IV SOLN
INTRAVENOUS | Status: DC
  Administered 2013-02-09: 20 mL/h via INTRAVENOUS

## 2013-02-09 MED ORDER — SODIUM CHLORIDE 0.9 % IV SOLN
INTRAVENOUS | Status: DC
  Administered 2013-02-09: 20 mL/h via INTRAVENOUS

## 2013-02-09 MED ORDER — PHENYLEPHRINE HCL 10 MG/ML IJ SOLN
0.0000 ug/min | INTRAMUSCULAR | Status: DC
  Filled 2013-02-09: qty 2

## 2013-02-09 MED ORDER — SODIUM CHLORIDE 0.9 % IV SOLN: 250.0000 mL | INTRAVENOUS | Status: DC

## 2013-02-09 MED ORDER — METOPROLOL TARTRATE 25 MG/10 ML ORAL SUSPENSION
12.5000 mg | Freq: Two times a day (BID) | ORAL | Status: DC
  Filled 2013-02-09 (×11): qty 5

## 2013-02-09 MED ORDER — ASPIRIN EC 325 MG PO TBEC
325.0000 mg | DELAYED_RELEASE_TABLET | Freq: Every day | ORAL | Status: DC
  Filled 2013-02-09: qty 1

## 2013-02-09 MED ORDER — MIDAZOLAM HCL 2 MG/2ML IJ SOLN: 2.0000 mg | INTRAMUSCULAR | Status: DC | PRN

## 2013-02-09 MED ORDER — SODIUM CHLORIDE 0.9 % IJ SOLN
OROMUCOSAL | Status: DC | PRN
  Administered 2013-02-09: 09:00:00 via TOPICAL

## 2013-02-09 MED ORDER — SODIUM CHLORIDE 0.9 % IJ SOLN: 3.0000 mL | INTRAMUSCULAR | Status: DC | PRN

## 2013-02-09 MED ORDER — INSULIN REGULAR BOLUS VIA INFUSION
0.0000 [IU] | Freq: Three times a day (TID) | INTRAVENOUS | Status: DC
  Filled 2013-02-09: qty 10

## 2013-02-09 MED ORDER — PROPOFOL 10 MG/ML IV BOLUS
INTRAVENOUS | Status: DC | PRN
  Administered 2013-02-09: 100 mg via INTRAVENOUS

## 2013-02-09 MED ORDER — SODIUM CHLORIDE 0.9 % IJ SOLN
3.0000 mL | Freq: Two times a day (BID) | INTRAMUSCULAR | Status: DC
  Administered 2013-02-10 – 2013-02-11 (×5): 3 mL via INTRAVENOUS

## 2013-02-09 MED ORDER — HEPARIN SODIUM (PORCINE) 1000 UNIT/ML IJ SOLN
INTRAMUSCULAR | Status: DC | PRN
  Administered 2013-02-09: 27000 [IU] via INTRAVENOUS
  Administered 2013-02-09: 2000 [IU] via INTRAVENOUS
  Administered 2013-02-09: 4000 [IU] via INTRAVENOUS

## 2013-02-09 MED ORDER — ALBUMIN HUMAN 5 % IV SOLN
250.0000 mL | INTRAVENOUS | Status: AC | PRN
  Administered 2013-02-09 (×2): 250 mL via INTRAVENOUS
  Filled 2013-02-09: qty 500

## 2013-02-09 MED ORDER — ACETAMINOPHEN 500 MG PO TABS
1000.0000 mg | ORAL_TABLET | Freq: Four times a day (QID) | ORAL | Status: DC
  Administered 2013-02-10 – 2013-02-12 (×7): 1000 mg via ORAL
  Filled 2013-02-09 (×14): qty 2

## 2013-02-09 MED ORDER — LACTATED RINGERS IV SOLN
INTRAVENOUS | Status: DC | PRN
  Administered 2013-02-09: 07:00:00 via INTRAVENOUS

## 2013-02-09 MED ORDER — DOPAMINE-DEXTROSE 3.2-5 MG/ML-% IV SOLN: 0.0000 ug/kg/min | INTRAVENOUS | Status: DC

## 2013-02-09 MED ORDER — METOPROLOL TARTRATE 12.5 MG HALF TABLET
12.5000 mg | ORAL_TABLET | Freq: Two times a day (BID) | ORAL | Status: DC
  Administered 2013-02-10 – 2013-02-14 (×8): 12.5 mg via ORAL
  Filled 2013-02-09 (×11): qty 1

## 2013-02-09 MED ORDER — SODIUM CHLORIDE 0.9 % IV SOLN
20.0000 ug | INTRAVENOUS | Status: AC
  Administered 2013-02-09: 20 ug via INTRAVENOUS
  Filled 2013-02-09: qty 5

## 2013-02-09 MED ORDER — LACTATED RINGERS IV SOLN
INTRAVENOUS | Status: DC
  Administered 2013-02-09: 20 mL/h via INTRAVENOUS

## 2013-02-09 MED ORDER — FAMOTIDINE IN NACL 20-0.9 MG/50ML-% IV SOLN
20.0000 mg | Freq: Two times a day (BID) | INTRAVENOUS | Status: AC
  Administered 2013-02-09: 20 mg via INTRAVENOUS

## 2013-02-09 MED ORDER — ONDANSETRON HCL 4 MG/2ML IJ SOLN
4.0000 mg | Freq: Four times a day (QID) | INTRAMUSCULAR | Status: DC | PRN
  Administered 2013-02-11: 4 mg via INTRAVENOUS
  Filled 2013-02-09 (×3): qty 2

## 2013-02-09 MED ORDER — ROCURONIUM BROMIDE 100 MG/10ML IV SOLN
INTRAVENOUS | Status: DC | PRN
  Administered 2013-02-09: 25 mg via INTRAVENOUS
  Administered 2013-02-09 (×2): 50 mg via INTRAVENOUS

## 2013-02-09 MED ORDER — AMINOCAPROIC ACID 250 MG/ML IV SOLN
0.5000 g/h | Freq: Once | INTRAVENOUS | Status: DC
Start: 2013-02-09 — End: 2013-02-09
  Filled 2013-02-09: qty 20

## 2013-02-09 MED ORDER — GLYCOPYRROLATE 0.2 MG/ML IJ SOLN
INTRAMUSCULAR | Status: DC | PRN
  Administered 2013-02-09: 0.4 mg via INTRAVENOUS

## 2013-02-09 MED ORDER — MAGNESIUM SULFATE 40 MG/ML IJ SOLN
4.0000 g | Freq: Once | INTRAMUSCULAR | Status: AC
Start: 2013-02-09 — End: 2013-02-09
  Administered 2013-02-09: 4 g via INTRAVENOUS
  Filled 2013-02-09: qty 100

## 2013-02-09 MED ORDER — VANCOMYCIN HCL IN DEXTROSE 1-5 GM/200ML-% IV SOLN
1000.0000 mg | Freq: Once | INTRAVENOUS | Status: AC
  Administered 2013-02-09: 1000 mg via INTRAVENOUS
  Filled 2013-02-09: qty 200

## 2013-02-09 MED ORDER — 0.9 % SODIUM CHLORIDE (POUR BTL) OPTIME
TOPICAL | Status: DC | PRN
  Administered 2013-02-09: 6000 mL

## 2013-02-09 MED ORDER — MORPHINE SULFATE 2 MG/ML IJ SOLN
2.0000 mg | INTRAMUSCULAR | Status: DC | PRN
  Administered 2013-02-10: 4 mg via INTRAVENOUS
  Administered 2013-02-10 – 2013-02-11 (×7): 2 mg via INTRAVENOUS
  Administered 2013-02-12: 4 mg via INTRAVENOUS
  Filled 2013-02-09 (×2): qty 1
  Filled 2013-02-09: qty 2
  Filled 2013-02-09 (×5): qty 1
  Filled 2013-02-09: qty 2

## 2013-02-09 MED ORDER — HEMOSTATIC AGENTS (NO CHARGE) OPTIME
TOPICAL | Status: DC | PRN
  Administered 2013-02-09: 1 via TOPICAL

## 2013-02-09 MED ORDER — ASPIRIN 81 MG PO CHEW: 324.0000 mg | CHEWABLE_TABLET | Freq: Every day | ORAL | Status: DC

## 2013-02-09 MED ORDER — DEXTROSE 5 % IV SOLN
1.5000 g | Freq: Two times a day (BID) | INTRAVENOUS | Status: AC
  Administered 2013-02-09 – 2013-02-11 (×4): 1.5 g via INTRAVENOUS
  Filled 2013-02-09 (×4): qty 1.5

## 2013-02-09 MED ORDER — MORPHINE SULFATE 2 MG/ML IJ SOLN
1.0000 mg | INTRAMUSCULAR | Status: AC | PRN
  Administered 2013-02-09 (×3): 2 mg via INTRAVENOUS
  Filled 2013-02-09 (×3): qty 1

## 2013-02-09 MED ORDER — BISACODYL 10 MG RE SUPP: 10.0000 mg | Freq: Every day | RECTAL | Status: DC

## 2013-02-09 MED ORDER — NITROGLYCERIN IN D5W 200-5 MCG/ML-% IV SOLN
0.0000 ug/min | INTRAVENOUS | Status: DC
  Administered 2013-02-09: 5 ug/min via INTRAVENOUS

## 2013-02-09 MED ORDER — DEXMEDETOMIDINE HCL IN NACL 200 MCG/50ML IV SOLN
0.1000 ug/kg/h | INTRAVENOUS | Status: DC
  Administered 2013-02-09: 0.7 ug/kg/h via INTRAVENOUS
  Filled 2013-02-09: qty 50

## 2013-02-09 MED ORDER — DOCUSATE SODIUM 100 MG PO CAPS: 200.0000 mg | ORAL_CAPSULE | Freq: Every day | ORAL | Status: DC

## 2013-02-09 MED ORDER — SODIUM CHLORIDE 0.9 % IV SOLN
INTRAVENOUS | Status: DC
  Filled 2013-02-09 (×2): qty 1

## 2013-02-09 MED ORDER — MIDAZOLAM HCL 5 MG/5ML IJ SOLN
INTRAMUSCULAR | Status: DC | PRN
  Administered 2013-02-09: 1 mg via INTRAVENOUS
  Administered 2013-02-09 (×3): 2 mg via INTRAVENOUS
  Administered 2013-02-09: 3 mg via INTRAVENOUS

## 2013-02-09 MED ORDER — ACETAMINOPHEN 10 MG/ML IV SOLN
1000.0000 mg | Freq: Once | INTRAVENOUS | Status: AC
  Administered 2013-02-09: 1000 mg via INTRAVENOUS
  Filled 2013-02-09: qty 100

## 2013-02-09 MED ORDER — ALBUMIN HUMAN 5 % IV SOLN
INTRAVENOUS | Status: DC | PRN
  Administered 2013-02-09: 13:00:00 via INTRAVENOUS

## 2013-02-09 MED ORDER — SODIUM CHLORIDE 0.9 % IV SOLN
INTRAVENOUS | Status: DC | PRN
  Administered 2013-02-09: 13:00:00 via INTRAVENOUS

## 2013-02-09 MED ORDER — LACTATED RINGERS IV SOLN: 500.0000 mL | Freq: Once | INTRAVENOUS | Status: AC | PRN

## 2013-02-09 MED ORDER — POTASSIUM CHLORIDE 10 MEQ/50ML IV SOLN
10.0000 meq | INTRAVENOUS | Status: AC
  Administered 2013-02-09 (×3): 10 meq via INTRAVENOUS

## 2013-02-09 MED ORDER — OXYCODONE HCL 5 MG PO TABS
5.0000 mg | ORAL_TABLET | ORAL | Status: DC | PRN
  Administered 2013-02-09 – 2013-02-12 (×7): 10 mg via ORAL
  Filled 2013-02-09 (×8): qty 2

## 2013-02-09 MED ORDER — SODIUM CHLORIDE 0.9 % IV SOLN
10.0000 mg | INTRAVENOUS | Status: DC | PRN
  Administered 2013-02-09: 10 ug/min via INTRAVENOUS

## 2013-02-09 MED ORDER — FENTANYL CITRATE 0.05 MG/ML IJ SOLN
INTRAMUSCULAR | Status: DC | PRN
  Administered 2013-02-09 (×2): 50 ug via INTRAVENOUS
  Administered 2013-02-09: 100 ug via INTRAVENOUS
  Administered 2013-02-09 (×2): 50 ug via INTRAVENOUS
  Administered 2013-02-09 (×2): 100 ug via INTRAVENOUS
  Administered 2013-02-09: 500 ug via INTRAVENOUS
  Administered 2013-02-09: 100 ug via INTRAVENOUS
  Administered 2013-02-09: 150 ug via INTRAVENOUS
  Administered 2013-02-09: 100 ug via INTRAVENOUS
  Administered 2013-02-09 (×2): 150 ug via INTRAVENOUS

## 2013-02-09 MED ORDER — PANTOPRAZOLE SODIUM 40 MG PO TBEC
40.0000 mg | DELAYED_RELEASE_TABLET | Freq: Every day | ORAL | Status: DC
  Administered 2013-02-11: 40 mg via ORAL
  Filled 2013-02-09: qty 1

## 2013-02-09 SURGICAL SUPPLY — 111 items
ADAPTER CARDIO PERF ANTE/RETRO (ADAPTER) ×4 IMPLANT
ATTRACTOMAT 16X20 MAGNETIC DRP (DRAPES) ×4 IMPLANT
BAG DECANTER FOR FLEXI CONT (MISCELLANEOUS) ×4 IMPLANT
BANDAGE ELASTIC 4 VELCRO ST LF (GAUZE/BANDAGES/DRESSINGS) ×4 IMPLANT
BANDAGE ELASTIC 6 VELCRO ST LF (GAUZE/BANDAGES/DRESSINGS) ×4 IMPLANT
BANDAGE GAUZE ELAST BULKY 4 IN (GAUZE/BANDAGES/DRESSINGS) ×4 IMPLANT
BASKET HEART  (ORDER IN 25'S) (MISCELLANEOUS) ×1
BASKET HEART (ORDER IN 25'S) (MISCELLANEOUS) ×1
BASKET HEART (ORDER IN 25S) (MISCELLANEOUS) ×2 IMPLANT
BLADE STERNUM SYSTEM 6 (BLADE) ×4 IMPLANT
BLADE SURG 12 STRL SS (BLADE) ×4 IMPLANT
BLADE SURG ROTATE 9660 (MISCELLANEOUS) IMPLANT
CANISTER SUCTION 2500CC (MISCELLANEOUS) ×4 IMPLANT
CANNULA AORTIC HI-FLOW 6.5M20F (CANNULA) ×4 IMPLANT
CANNULA ARTERIAL NVNT 3/8 20FR (MISCELLANEOUS) ×4 IMPLANT
CANNULA GUNDRY RCSP 15FR (MISCELLANEOUS) ×4 IMPLANT
CANNULA VENOUS LOW PROF 32X40 (CANNULA) ×4 IMPLANT
CANNULA VENOUS MAL SGL STG 40 (MISCELLANEOUS) IMPLANT
CANNULAE VENOUS MAL SGL STG 40 (MISCELLANEOUS)
CATH CPB KIT VANTRIGT (MISCELLANEOUS) ×4 IMPLANT
CATH ROBINSON RED A/P 18FR (CATHETERS) ×12 IMPLANT
CATH THORACIC 28FR (CATHETERS) IMPLANT
CATH THORACIC 28FR RT ANG (CATHETERS) IMPLANT
CATH THORACIC 36FR (CATHETERS) IMPLANT
CATH THORACIC 36FR RT ANG (CATHETERS) ×8 IMPLANT
CLIP TI WIDE RED SMALL 24 (CLIP) ×4 IMPLANT
CLOTH BEACON ORANGE TIMEOUT ST (SAFETY) ×4 IMPLANT
COVER SURGICAL LIGHT HANDLE (MISCELLANEOUS) ×4 IMPLANT
CRADLE DONUT ADULT HEAD (MISCELLANEOUS) ×4 IMPLANT
DRAIN CHANNEL 32F RND 10.7 FF (WOUND CARE) ×4 IMPLANT
DRAPE CARDIOVASCULAR INCISE (DRAPES) ×2
DRAPE SLUSH/WARMER DISC (DRAPES) ×4 IMPLANT
DRAPE SRG 135X102X78XABS (DRAPES) ×2 IMPLANT
DRSG COVADERM 4X14 (GAUZE/BANDAGES/DRESSINGS) ×4 IMPLANT
ELECT BLADE 4.0 EZ CLEAN MEGAD (MISCELLANEOUS) ×4
ELECT BLADE 6.5 EXT (BLADE) ×4 IMPLANT
ELECT CAUTERY BLADE 6.4 (BLADE) ×4 IMPLANT
ELECT REM PT RETURN 9FT ADLT (ELECTROSURGICAL) ×8
ELECTRODE BLDE 4.0 EZ CLN MEGD (MISCELLANEOUS) ×2 IMPLANT
ELECTRODE REM PT RTRN 9FT ADLT (ELECTROSURGICAL) ×4 IMPLANT
GLOVE BIO SURGEON STRL SZ 6.5 (GLOVE) ×9 IMPLANT
GLOVE BIO SURGEON STRL SZ7 (GLOVE) ×4 IMPLANT
GLOVE BIO SURGEON STRL SZ7.5 (GLOVE) ×24 IMPLANT
GLOVE BIO SURGEONS STRL SZ 6.5 (GLOVE) ×3
GLOVE BIOGEL PI IND STRL 6 (GLOVE) ×8 IMPLANT
GLOVE BIOGEL PI IND STRL 6.5 (GLOVE) ×8 IMPLANT
GLOVE BIOGEL PI IND STRL 7.0 (GLOVE) ×8 IMPLANT
GLOVE BIOGEL PI INDICATOR 6 (GLOVE) ×8
GLOVE BIOGEL PI INDICATOR 6.5 (GLOVE) ×8
GLOVE BIOGEL PI INDICATOR 7.0 (GLOVE) ×8
GOWN EXTRA PROTECTION XL (GOWNS) ×8 IMPLANT
GOWN STRL NON-REIN LRG LVL3 (GOWN DISPOSABLE) ×32 IMPLANT
HEMOSTAT POWDER SURGIFOAM 1G (HEMOSTASIS) ×12 IMPLANT
HEMOSTAT SURGICEL 2X14 (HEMOSTASIS) ×4 IMPLANT
INSERT FOGARTY XLG (MISCELLANEOUS) IMPLANT
KIT BASIN OR (CUSTOM PROCEDURE TRAY) ×4 IMPLANT
KIT ROOM TURNOVER OR (KITS) ×4 IMPLANT
KIT SUCTION CATH 14FR (SUCTIONS) ×4 IMPLANT
KIT VASOVIEW W/TROCAR VH 2000 (KITS) ×4 IMPLANT
LEAD PACING MYOCARDI (MISCELLANEOUS) ×4 IMPLANT
MARKER GRAFT CORONARY BYPASS (MISCELLANEOUS) ×12 IMPLANT
NS IRRIG 1000ML POUR BTL (IV SOLUTION) ×28 IMPLANT
PACK OPEN HEART (CUSTOM PROCEDURE TRAY) ×4 IMPLANT
PAD ARMBOARD 7.5X6 YLW CONV (MISCELLANEOUS) ×12 IMPLANT
PAD ELECT DEFIB RADIOL ZOLL (MISCELLANEOUS) ×4 IMPLANT
PENCIL BUTTON HOLSTER BLD 10FT (ELECTRODE) ×4 IMPLANT
PUNCH AORTIC ROTATE 4.0MM (MISCELLANEOUS) IMPLANT
PUNCH AORTIC ROTATE 4.5MM 8IN (MISCELLANEOUS) ×4 IMPLANT
PUNCH AORTIC ROTATE 5MM 8IN (MISCELLANEOUS) IMPLANT
SPONGE GAUZE 4X4 12PLY (GAUZE/BANDAGES/DRESSINGS) ×12 IMPLANT
SPONGE LAP 4X18 X RAY DECT (DISPOSABLE) ×4 IMPLANT
SURGIFLO W/THROMBIN 8M KIT (HEMOSTASIS) ×4 IMPLANT
SUT BONE WAX W31G (SUTURE) ×4 IMPLANT
SUT MNCRL AB 4-0 PS2 18 (SUTURE) IMPLANT
SUT PROLENE 3 0 SH DA (SUTURE) IMPLANT
SUT PROLENE 3 0 SH1 36 (SUTURE) IMPLANT
SUT PROLENE 4 0 RB 1 (SUTURE) ×2
SUT PROLENE 4 0 SH DA (SUTURE) ×4 IMPLANT
SUT PROLENE 4-0 RB1 .5 CRCL 36 (SUTURE) ×2 IMPLANT
SUT PROLENE 5 0 C 1 36 (SUTURE) IMPLANT
SUT PROLENE 6 0 C 1 30 (SUTURE) IMPLANT
SUT PROLENE 6 0 CC (SUTURE) ×16 IMPLANT
SUT PROLENE 7 0 DA (SUTURE) IMPLANT
SUT PROLENE 7.0 RB 3 (SUTURE) ×12 IMPLANT
SUT PROLENE 8 0 BV175 6 (SUTURE) IMPLANT
SUT PROLENE BLUE 7 0 (SUTURE) ×16 IMPLANT
SUT SILK  1 MH (SUTURE)
SUT SILK 1 MH (SUTURE) IMPLANT
SUT SILK 2 0 SH CR/8 (SUTURE) IMPLANT
SUT SILK 3 0 SH CR/8 (SUTURE) IMPLANT
SUT STEEL 6MS V (SUTURE) ×8 IMPLANT
SUT STEEL SZ 6 DBL 3X14 BALL (SUTURE) ×4 IMPLANT
SUT VIC AB 1 CTX 36 (SUTURE) ×4
SUT VIC AB 1 CTX36XBRD ANBCTR (SUTURE) ×4 IMPLANT
SUT VIC AB 2-0 CT1 27 (SUTURE) ×2
SUT VIC AB 2-0 CT1 TAPERPNT 27 (SUTURE) ×2 IMPLANT
SUT VIC AB 2-0 CTX 27 (SUTURE) IMPLANT
SUT VIC AB 3-0 SH 27 (SUTURE)
SUT VIC AB 3-0 SH 27X BRD (SUTURE) IMPLANT
SUT VIC AB 3-0 X1 27 (SUTURE) ×4 IMPLANT
SUT VICRYL 4-0 PS2 18IN ABS (SUTURE) IMPLANT
SUTURE E-PAK OPEN HEART (SUTURE) ×4 IMPLANT
SYSTEM SAHARA CHEST DRAIN ATS (WOUND CARE) ×4 IMPLANT
TAPE CLOTH SURG 4X10 WHT LF (GAUZE/BANDAGES/DRESSINGS) ×4 IMPLANT
TOWEL OR 17X24 6PK STRL BLUE (TOWEL DISPOSABLE) ×8 IMPLANT
TOWEL OR 17X26 10 PK STRL BLUE (TOWEL DISPOSABLE) ×8 IMPLANT
TRAY FOLEY IC TEMP SENS 14FR (CATHETERS) ×4 IMPLANT
TUBE SUCT INTRACARD DLP 20F (MISCELLANEOUS) ×4 IMPLANT
TUBING INSUFFLATION 10FT LAP (TUBING) ×4 IMPLANT
UNDERPAD 30X30 INCONTINENT (UNDERPADS AND DIAPERS) ×4 IMPLANT
WATER STERILE IRR 1000ML POUR (IV SOLUTION) ×8 IMPLANT

## 2013-02-09 NOTE — Procedures (Signed)
Extubation Procedure Note  Patient Details:   Name: Tony [name] DOB: 1941-07-20 MRN: [id number]   Airway Documentation:  AIRWAYS (Active)    Evaluation  O2 sats: stable throughout Complications: No apparent complications Patient did tolerate procedure well. Bilateral Breath Sounds: Clear   Yes  Pt tolerated rapid wean, -30 NIF, VC, and positive for cuff leak. Pt extubated to 4lpm [place], no stridor or dyspnea noted after extubation. Pt resting comfortably at this time and all vitals are within normal limits. Pt achieved x2 with IS. RT will continue to monitor.    Beatris Si 02/09/2013, 6:35 PM

## 2013-02-09 NOTE — Transfer of Care (Signed)
Immediate Anesthesia Transfer of Care Note  Patient: Tony [name]  Procedure(s) Performed: Procedure(s): CORONARY ARTERY BYPASS GRAFTING (CABG) (N/A) INTRAOPERATIVE TRANSESOPHAGEAL ECHOCARDIOGRAM (N/A)  Patient Location: SICU  Anesthesia Type:General  Level of Consciousness: sedated and Patient remains intubated per anesthesia plan  Airway & Oxygen Therapy: Patient remains intubated per anesthesia plan and Patient placed on Ventilator (see vital sign flow sheet for setting)  Post-op Assessment: Report given to PACU RN and Post -op Vital signs reviewed and stable  Post vital signs: stable  Complications: No apparent anesthesia complications

## 2013-02-09 NOTE — OR Nursing (Signed)
45 minute call made to 2300 at 1243; spoke with [facility].  Volunteer desk called at 1243 to notify family of off pump status.  20 minute call made to 2300 at 1305; spoke with [facility].

## 2013-02-09 NOTE — Plan of Care (Signed)
Problem: Phase II Progression Outcomes Goal: Patient extubated within - Outcome: Completed/Met Date Met:  02/09/13 6hrs

## 2013-02-09 NOTE — Anesthesia Postprocedure Evaluation (Signed)
  Anesthesia Post-op Note  Patient: Tony [name]  Procedure(s) Performed: Procedure(s): CORONARY ARTERY BYPASS GRAFTING (CABG) (N/A) INTRAOPERATIVE TRANSESOPHAGEAL ECHOCARDIOGRAM (N/A)  Patient Location: SICU  Anesthesia Type:General  Level of Consciousness: sedated and Patient remains intubated per anesthesia plan  Airway and Oxygen Therapy: Patient remains intubated per anesthesia plan and Patient placed on Ventilator (see vital sign flow sheet for setting)  Post-op Pain: none  Post-op Assessment: Post-op Vital signs reviewed, Patient's Cardiovascular Status Stable, Respiratory Function Stable, Patent Airway, No signs of Nausea or vomiting and Pain level controlled  Post-op Vital Signs: stable  Complications: No apparent anesthesia complications

## 2013-02-09 NOTE — Progress Notes (Signed)
The patient was examined and preop studies reviewed. There has been no change from the prior exam and the patient is ready for surgery. Plan CABG on B [place] this am

## 2013-02-09 NOTE — Anesthesia Procedure Notes (Addendum)
Procedure Name: Intubation Date/Time: 02/09/2013 7:43 AM Performed by: Quentin Ore Pre-anesthesia Checklist: Patient identified, Emergency Drugs available, Suction available, Patient being monitored and Timeout performed Patient Re-evaluated:Patient Re-evaluated prior to inductionOxygen Delivery Method: Circle system utilized Preoxygenation: Pre-oxygenation with 100% oxygen Intubation Type: IV induction Ventilation: Mask ventilation without difficulty and Oral airway inserted - appropriate to patient size Laryngoscope Size: Mac and 4 Grade View: Grade II Tube type: Oral Tube size: 8.0 mm Number of attempts: 1 Airway Equipment and Method: Stylet Placement Confirmation: ETT inserted through vocal cords under direct vision,  positive ETCO2 and breath sounds checked- equal and bilateral Secured at: 24 cm Tube secured with: Tape Dental Injury: Teeth and Oropharynx as per pre-operative assessment  Comments: Intubation per Lina Sayre, SRNA with supervision per Dr. Katrinka Blazing.

## 2013-02-09 NOTE — Progress Notes (Signed)
Intubated  Starting to wake up  BP 110/65  Pulse 88  Temp(Src) 97.2 F (36.2 C) (Core (Comment))  Resp 24  Ht 5\' 11"  (1.803 m)  Wt 221 lb (100.245 kg)  BMI 30.84 kg/m2  SpO2 100%   Intake/Output Summary (Last 24 hours) at 02/09/13 1807 Last data filed at 02/09/13 1600  Gross per 24 hour  Intake 3592.83 ml  Output   2795 ml  Net 797.83 ml    ABG Ok on CPAP  Wean to extubation

## 2013-02-09 NOTE — Brief Op Note (Signed)
      [address]       [place] [address]             [phone]     02/05/2013 - 02/09/2013  11:59 AM  PATIENT:  Tony [name]  72 y.o. male  PRE-OPERATIVE DIAGNOSIS:  CAD left main  POST-OPERATIVE DIAGNOSIS:  Coronary Artery Disease; Left Main   PROCEDURE:  Procedure(s): CORONARY ARTERY BYPASS GRAFTING (CABG)X 4 LIMA-LAD; SEQ SVG-OM-DIST CX; SVG-RCA EVH RIGHT LEG INTRAOPERATIVE TRANSESOPHAGEAL ECHOCARDIOGRAM  SURGEON:  Surgeon(s): Kerin Perna, MD  PHYSICIAN ASSISTANT: WAYNE GOLD PA-C  ANESTHESIA:   general  PATIENT CONDITION:  ICU - intubated and hemodynamically stable.  PRE-OPERATIVE WEIGHT: 100.2kg  COMPLICATIONS: NO KNOWN

## 2013-02-10 ENCOUNTER — Inpatient Hospital Stay (HOSPITAL_COMMUNITY): Payer: Medicare Other

## 2013-02-10 ENCOUNTER — Encounter (HOSPITAL_COMMUNITY): Payer: Self-pay | Admitting: Cardiothoracic Surgery

## 2013-02-10 LAB — GLUCOSE, CAPILLARY
Glucose-Capillary: 109 mg/dL — ABNORMAL HIGH (ref 70–99)
Glucose-Capillary: 108 mg/dL — ABNORMAL HIGH (ref 70–99)
Glucose-Capillary: 98 mg/dL (ref 70–99)
Glucose-Capillary: 106 mg/dL — ABNORMAL HIGH (ref 70–99)
Glucose-Capillary: 113 mg/dL — ABNORMAL HIGH (ref 70–99)
Glucose-Capillary: 117 mg/dL — ABNORMAL HIGH (ref 70–99)
Glucose-Capillary: 119 mg/dL — ABNORMAL HIGH (ref 70–99)
Glucose-Capillary: 138 mg/dL — ABNORMAL HIGH (ref 70–99)
Glucose-Capillary: 111 mg/dL — ABNORMAL HIGH (ref 70–99)
Glucose-Capillary: 161 mg/dL — ABNORMAL HIGH (ref 70–99)
Glucose-Capillary: 163 mg/dL — ABNORMAL HIGH (ref 70–99)

## 2013-02-10 LAB — POCT I-STAT, CHEM 8
Creatinine, Ser: 1.1 mg/dL (ref 0.50–1.35)
Glucose, Bld: 173 mg/dL — ABNORMAL HIGH (ref 70–99)
HCT: 32 % — ABNORMAL LOW (ref 39.0–52.0)
Hemoglobin: 10.9 g/dL — ABNORMAL LOW (ref 13.0–17.0)
Potassium: 4.4 mEq/L (ref 3.5–5.1)
Sodium: 136 mEq/L (ref 135–145)
TCO2: 24 mmol/L (ref 0–100)

## 2013-02-10 LAB — CBC
HCT: 30.8 % — ABNORMAL LOW (ref 39.0–52.0)
HCT: 31.9 % — ABNORMAL LOW (ref 39.0–52.0)
Hemoglobin: 10.6 g/dL — ABNORMAL LOW (ref 13.0–17.0)
Hemoglobin: 11 g/dL — ABNORMAL LOW (ref 13.0–17.0)
MCH: 29.5 pg (ref 26.0–34.0)
Platelets: 122 10*3/uL — ABNORMAL LOW (ref 150–400)
RBC: 3.73 MIL/uL — ABNORMAL LOW (ref 4.22–5.81)
WBC: 15.3 10*3/uL — ABNORMAL HIGH (ref 4.0–10.5)

## 2013-02-10 LAB — CREATININE, SERUM
GFR calc Af Amer: >90 mL/min (ref >90)
GFR calc non Af Amer: 81 mL/min — ABNORMAL LOW (ref >90)

## 2013-02-10 LAB — BASIC METABOLIC PANEL
BUN: 14 mg/dL (ref 6–23)
Chloride: 104 mEq/L (ref 96–112)
GFR calc Af Amer: >90 mL/min (ref >90)
Glucose, Bld: 123 mg/dL — ABNORMAL HIGH (ref 70–99)
Potassium: 3.8 mEq/L (ref 3.5–5.1)

## 2013-02-10 MED ORDER — BISACODYL 5 MG PO TBEC: 10.0000 mg | DELAYED_RELEASE_TABLET | Freq: Every day | ORAL | Status: DC | PRN

## 2013-02-10 MED ORDER — FUROSEMIDE 40 MG PO TABS
40.0000 mg | ORAL_TABLET | Freq: Every day | ORAL | Status: DC
  Administered 2013-02-10 – 2013-02-12 (×3): 40 mg via ORAL
  Filled 2013-02-10 (×5): qty 1

## 2013-02-10 MED ORDER — INSULIN ASPART 100 UNIT/ML ~~LOC~~ SOLN
0.0000 [IU] | Freq: Four times a day (QID) | SUBCUTANEOUS | Status: DC
  Administered 2013-02-10 (×2): 4 [IU] via SUBCUTANEOUS
  Administered 2013-02-11: 2 [IU] via SUBCUTANEOUS

## 2013-02-10 MED ORDER — POTASSIUM CHLORIDE 10 MEQ/50ML IV SOLN
10.0000 meq | INTRAVENOUS | Status: AC
Start: 2013-02-10 — End: 2013-02-10
  Administered 2013-02-10 (×3): 10 meq via INTRAVENOUS
  Filled 2013-02-10: qty 150

## 2013-02-10 MED ORDER — SODIUM CHLORIDE 0.9 % IJ SOLN
3.0000 mL | Freq: Two times a day (BID) | INTRAMUSCULAR | Status: DC
  Administered 2013-02-10: 22:00:00 via INTRAVENOUS
  Administered 2013-02-12 – 2013-02-14 (×5): 3 mL via INTRAVENOUS

## 2013-02-10 MED ORDER — OXYCODONE HCL 5 MG PO TABS
5.0000 mg | ORAL_TABLET | ORAL | Status: DC | PRN
  Administered 2013-02-11 – 2013-02-14 (×9): 10 mg via ORAL
  Filled 2013-02-10 (×8): qty 2

## 2013-02-10 MED ORDER — MOVING RIGHT ALONG BOOK
Freq: Once | Status: AC
  Administered 2013-02-10: 08:00:00
  Filled 2013-02-10: qty 1

## 2013-02-10 MED ORDER — BISACODYL 10 MG RE SUPP
10.0000 mg | Freq: Every day | RECTAL | Status: DC | PRN
  Administered 2013-02-13: 10 mg via RECTAL
  Filled 2013-02-10: qty 1

## 2013-02-10 MED ORDER — TRAMADOL HCL 50 MG PO TABS
50.0000 mg | ORAL_TABLET | ORAL | Status: DC | PRN
  Administered 2013-02-10 – 2013-02-12 (×3): 100 mg via ORAL
  Filled 2013-02-10 (×3): qty 2

## 2013-02-10 MED ORDER — SIMVASTATIN 20 MG PO TABS
20.0000 mg | ORAL_TABLET | Freq: Every day | ORAL | Status: DC
  Administered 2013-02-10 – 2013-02-13 (×4): 20 mg via ORAL
  Filled 2013-02-10 (×5): qty 1

## 2013-02-10 MED ORDER — ASPIRIN EC 325 MG PO TBEC
325.0000 mg | DELAYED_RELEASE_TABLET | Freq: Every day | ORAL | Status: DC
  Filled 2013-02-10 (×5): qty 1

## 2013-02-10 MED ORDER — DOCUSATE SODIUM 100 MG PO CAPS
200.0000 mg | ORAL_CAPSULE | Freq: Every day | ORAL | Status: DC
  Administered 2013-02-10 – 2013-02-12 (×3): 200 mg via ORAL
  Filled 2013-02-10: qty 1
  Filled 2013-02-10 (×2): qty 2
  Filled 2013-02-10: qty 1

## 2013-02-10 MED ORDER — ONDANSETRON HCL 4 MG PO TABS
4.0000 mg | ORAL_TABLET | Freq: Four times a day (QID) | ORAL | Status: DC | PRN
  Administered 2013-02-12: 4 mg via ORAL
  Filled 2013-02-10: qty 1

## 2013-02-10 MED ORDER — INSULIN ASPART 100 UNIT/ML ~~LOC~~ SOLN
0.0000 [IU] | Freq: Four times a day (QID) | SUBCUTANEOUS | Status: DC
  Administered 2013-02-10: 2 [IU] via SUBCUTANEOUS

## 2013-02-10 MED ORDER — POTASSIUM CHLORIDE CRYS ER 20 MEQ PO TBCR
20.0000 meq | EXTENDED_RELEASE_TABLET | Freq: Every day | ORAL | Status: DC
  Administered 2013-02-10 – 2013-02-14 (×5): 20 meq via ORAL
  Filled 2013-02-10 (×6): qty 1

## 2013-02-10 MED ORDER — SODIUM CHLORIDE 0.9 % IJ SOLN: 3.0000 mL | INTRAMUSCULAR | Status: DC | PRN

## 2013-02-10 MED ORDER — ALUM & MAG HYDROXIDE-SIMETH 200-200-20 MG/5ML PO SUSP: 15.0000 mL | ORAL | Status: DC | PRN

## 2013-02-10 MED ORDER — MAGNESIUM HYDROXIDE 400 MG/5ML PO SUSP: 30.0000 mL | Freq: Every day | ORAL | Status: DC | PRN

## 2013-02-10 MED ORDER — SODIUM CHLORIDE 0.9 % IV SOLN: 250.0000 mL | INTRAVENOUS | Status: DC | PRN

## 2013-02-10 MED ORDER — ONDANSETRON HCL 4 MG/2ML IJ SOLN
4.0000 mg | Freq: Four times a day (QID) | INTRAMUSCULAR | Status: DC | PRN
  Administered 2013-02-10 – 2013-02-13 (×5): 4 mg via INTRAVENOUS
  Filled 2013-02-10 (×3): qty 2

## 2013-02-10 MED ORDER — PANTOPRAZOLE SODIUM 40 MG PO TBEC
40.0000 mg | DELAYED_RELEASE_TABLET | Freq: Every day | ORAL | Status: DC
  Administered 2013-02-12 – 2013-02-14 (×3): 40 mg via ORAL
  Filled 2013-02-10 (×3): qty 1

## 2013-02-10 MED ORDER — ACETAMINOPHEN 325 MG PO TABS: 650.0000 mg | ORAL_TABLET | Freq: Four times a day (QID) | ORAL | Status: DC | PRN

## 2013-02-10 MED FILL — Sodium Chloride IV Soln 0.9%: INTRAVENOUS | Qty: 1000 | Status: AC

## 2013-02-10 MED FILL — Heparin Sodium (Porcine) Inj 1000 Unit/ML: INTRAMUSCULAR | Qty: 30 | Status: AC

## 2013-02-10 MED FILL — Magnesium Sulfate Inj 50%: INTRAMUSCULAR | Qty: 10 | Status: AC

## 2013-02-10 MED FILL — Potassium Chloride Inj 2 mEq/ML: INTRAVENOUS | Qty: 40 | Status: AC

## 2013-02-10 NOTE — Progress Notes (Signed)
EVENING ROUNDS NOTE :     [address]       [place] [address]             [phone]                 1 Day Post-Op Procedure(s) (LRB): CORONARY ARTERY BYPASS GRAFTING (CABG) (N/A) INTRAOPERATIVE TRANSESOPHAGEAL ECHOCARDIOGRAM (N/A)  Total Length of Stay:  LOS: 5 days  BP 123/67  Pulse 79  Temp(Src) 97.4 F (36.3 C) (Oral)  Resp 19  Ht 5\' 11"  (1.803 m)  Wt 233 lb 14.5 oz (106.1 kg)  BMI 32.64 kg/m2  SpO2 93%  .Intake/Output     06/17 0701 - 06/18 0700   P.O. 600   I.V. (mL/kg) 403.7 (3.8)   Blood    NG/GT    IV Piggyback 150   Total Intake(mL/kg) 1153.7 (10.9)   Urine (mL/kg/hr) 550 (0.4)   Emesis/NG output    Blood    Chest Tube 30 (0)   Total Output 580   Net +573.7         . sodium chloride 20 mL/hr at 02/09/13 1900  . sodium chloride 20 mL/hr at 02/09/13 2000  . sodium chloride    . dexmedetomidine Stopped (02/10/13 0300)  . DOPamine Stopped (02/09/13 1400)  . lactated ringers Stopped (02/10/13 1000)  . nitroGLYCERIN Stopped (02/09/13 1600)  . phenylephrine (NEO-SYNEPHRINE) Adult infusion Stopped (02/10/13 0931)     Lab Results  Component Value Date   WBC 15.9* 02/10/2013   HGB 11.0* 02/10/2013   HCT 31.9* 02/10/2013   PLT 122* 02/10/2013   GLUCOSE 173* 02/10/2013   CHOL 210* 02/06/2013   TRIG 377* 02/06/2013   HDL 32* 02/06/2013   LDLCALC 103* 02/06/2013   ALT 18 02/06/2013   AST 17 02/06/2013   NA 136 02/10/2013   K 4.4 02/10/2013   CL 102 02/10/2013   CREATININE 0.99 02/10/2013   BUN 17 02/10/2013   CO2 23 02/10/2013   INR 1.17 02/09/2013   HGBA1C 6.7* 02/06/2013   Stable day, walked around unit Foley out has been able to urinate  Stepdown in am Delight Ovens MD  Beeper [phone] Office [phone] 02/10/2013 7:02 PM

## 2013-02-10 NOTE — Progress Notes (Signed)
Utilization Review Completed. 02/10/2013  

## 2013-02-10 NOTE — Progress Notes (Addendum)
TCTS DAILY ICU PROGRESS NOTE                   [address]            [place] [address]          [phone]   1 Day Post-Op Procedure(s) (LRB): CORONARY ARTERY BYPASS GRAFTING (CABG) (N/A) INTRAOPERATIVE TRANSESOPHAGEAL ECHOCARDIOGRAM (N/A)  Total Length of Stay:  LOS: 5 days   Subjective: C/o pain, sternal and chronic neck/back  Objective: Vital signs in last 24 hours: Temp:  [97.2 F (36.2 C)-98.8 F (37.1 C)] 98.8 F (37.1 C) (06/17 0645) Pulse Rate:  [83-88] 88 (06/17 0645) Cardiac Rhythm:  [-] Atrial paced (06/17 0000) Resp:  [12-27] 27 (06/17 0645) BP: (83-121)/(52-75) 101/72 mmHg (06/17 0645) SpO2:  [96 %-100 %] 96 % (06/17 0645) Arterial Line BP: (81-128)/(49-72) 128/67 mmHg (06/17 0645) FiO2 (%):  [40 %-50 %] 40 % (06/16 1830) Weight:  [233 lb 14.5 oz (106.1 kg)] 233 lb 14.5 oz (106.1 kg) (06/17 0500)  Filed Weights   02/08/13 0413 02/09/13 0409 02/10/13 0500  Weight: 223 lb (101.152 kg) 221 lb (100.245 kg) 233 lb 14.5 oz (106.1 kg)   Weight change: 12 lb 14.5 oz (5.855 kg)   Vent Mode:  [-] PSV;CPAP FiO2 (%):  [40 %-50 %] 40 % Set Rate:  [4 bmp-12 bmp] 4 bmp Vt Set:  [600 mL] 600 mL PEEP:  [5 cmH20] 5 cmH20 Pressure Support:  [10 cmH20] 10 cmH20 Plateau Pressure:  [15 cmH20-18 cmH20] 15 cmH20  Hemodynamic parameters for last 24 hours: PAP: (16-61)/(8-45) 31/19 mmHg CO:  [4.6 L/min-7.4 L/min] 6.5 L/min CI:  [2.1 L/min/m2-3.4 L/min/m2] 3 L/min/m2  Intake/Output from previous day: 06/16 0701 - 06/17 0700 In: 5334.3 [I.V.:3354.3; Blood:400; NG/GT:30; IV Piggyback:1550] Out: 4285 [Urine:2715; Emesis/NG output:100; Blood:800; Chest Tube:670]  Intake/Output this shift:    Current Meds: Scheduled Meds: . acetaminophen  1,000 mg Oral Q6H   Or  . acetaminophen (TYLENOL) oral liquid 160 mg/5 mL  975 mg Per Tube Q6H  . aspirin EC  325 mg Oral Daily   Or  . aspirin  324 mg Per Tube Daily  . bisacodyl  10 mg Oral Daily   Or  .  bisacodyl  10 mg Rectal Daily  . cefUROXime (ZINACEF)  IV  1.5 g Intravenous Q12H  . docusate sodium  200 mg Oral Daily  . famotidine (PEPCID) IV  20 mg Intravenous Q12H  . insulin regular  0-10 Units Intravenous TID WC  . metoprolol tartrate  12.5 mg Oral BID   Or  . metoprolol tartrate  12.5 mg Per Tube BID  . [START ON 02/11/2013] pantoprazole  40 mg Oral Daily  . potassium chloride  10 mEq Intravenous Q1 Hr x 3  . sodium chloride  3 mL Intravenous Q12H   Continuous Infusions: . sodium chloride 20 mL/hr at 02/09/13 1900  . sodium chloride 20 mL/hr at 02/09/13 2000  . sodium chloride    . dexmedetomidine Stopped (02/10/13 0300)  . DOPamine Stopped (02/09/13 1400)  . insulin (NOVOLIN-R) infusion 2.2 Units/hr (02/09/13 1600)  . lactated ringers 20 mL/hr (02/09/13 1400)  . nitroGLYCERIN Stopped (02/09/13 1600)  . phenylephrine (NEO-SYNEPHRINE) Adult infusion 20 mcg/min (02/10/13 0600)   PRN Meds:.albumin human, metoprolol, midazolam, morphine injection, ondansetron (ZOFRAN) IV, oxyCODONE, sodium chloride  General appearance: alert, cooperative and no distress Neurologic: intact Heart: regular rate and rhythm Lungs: fairly clear anteriorly Abdomen: soft, nontender Extremities: min edema  Wound: dressings CDI  Lab Results: CBC: Recent Labs  02/09/13 2122 02/10/13 0318  WBC 16.0* 15.3*  HGB 11.6* 10.6*  HCT 33.5* 30.8*  PLT 144* 127*   BMET:  Recent Labs  02/07/13 2330  02/09/13 2113 02/09/13 2122 02/10/13 0318  NA 134*  < > 139  --  136  K 3.4*  < > 4.1  --  3.8  CL 101  --  106  --  104  CO2 25  --   --   --  23  GLUCOSE 139*  < > 143*  --  123*  BUN 16  --  15  --  14  CREATININE 0.98  --  0.80 0.85 0.83  CALCIUM 9.2  --   --   --  8.1*  < > = values in this interval not displayed.  PT/INR:  Recent Labs  02/09/13 1300  LABPROT 14.7  INR 1.17   ABG    Component Value Date/Time   PHART 7.359 02/09/2013 2113   PCO2ART 38.6 02/09/2013 2113   PO2ART 87.0  02/09/2013 2113   HCO3 21.8 02/09/2013 2113   TCO2 23 02/09/2013 2113   TCO2 22 02/09/2013 2113   ACIDBASEDEF 3.0* 02/09/2013 2113   O2SAT 96.0 02/09/2013 2113     Radiology: Dg Chest Portable 1 View  02/09/2013   *RADIOLOGY REPORT*  Clinical Data: Postop CABG  PORTABLE CHEST - 1 VIEW  Comparison: 02/05/2013  Findings: Grossly unchanged enlarged cardiac silhouette and mediastinal contours post median sternotomy and CABG.  There is persistent mild tortuosity of the thoracic aorta.  Atherosclerotic calcifications within the thoracic aorta.  Endotracheal tube overlies the tracheal air column with tip superior to the carina.  Right jugular approach PA catheter tip overlies the main pulmonary artery outflow tract.  Mediastinal drains and left sided chest tube.  No pneumothorax.  There is minimal perihilar opacities favored to represent atelectasis.  No definite pleural effusion or evidence of edema.  Unchanged bones.  IMPRESSION: 1.  Post median sternotomy and CABG with appropriately positioned support apparatus as above.  No pneumothorax. 2.  Minimal perihilar atelectasis.  No evidence of edema.   Original Report Authenticated By: Tacey Ruiz, MD  CXR with some vasc congestion    Assessment/Plan: S/P Procedure(s) (LRB): CORONARY ARTERY BYPASS GRAFTING (CABG) (N/A) INTRAOPERATIVE TRANSESOPHAGEAL ECHOCARDIOGRAM (N/A) Mobilize Diuresis Diabetes control d/c tubes/lines Continue foley due to diuresing patient See progression orders poss tx to 2000 if able to get neo off Labs ok, ABL anemia -expected and stable    GOLD,WAYNE E 02/10/2013 7:29 AM  I have seen and examined the patient and agree with the assessment and plan as outlined.  OWEN,CLARENCE H 02/10/2013 2:02 PM

## 2013-02-11 ENCOUNTER — Inpatient Hospital Stay (HOSPITAL_COMMUNITY): Payer: Medicare Other

## 2013-02-11 LAB — COMPREHENSIVE METABOLIC PANEL
ALT: 15 U/L (ref 0–53)
AST: 29 U/L (ref 0–37)
Albumin: 3.2 g/dL — ABNORMAL LOW (ref 3.5–5.2)
Alkaline Phosphatase: 45 U/L (ref 39–117)
BUN: 18 mg/dL (ref 6–23)
CO2: 25 mEq/L (ref 19–32)
Calcium: 8.9 mg/dL (ref 8.4–10.5)
Chloride: 99 mEq/L (ref 96–112)
Creatinine, Ser: 0.84 mg/dL (ref 0.50–1.35)
GFR calc Af Amer: >90 mL/min (ref >90)
GFR calc non Af Amer: 87 mL/min — ABNORMAL LOW (ref >90)
Glucose, Bld: 159 mg/dL — ABNORMAL HIGH (ref 70–99)
Potassium: 4.4 mEq/L (ref 3.5–5.1)
Sodium: 133 mEq/L — ABNORMAL LOW (ref 135–145)
Total Bilirubin: 0.7 mg/dL (ref 0.3–1.2)
Total Protein: 6.2 g/dL (ref 6.0–8.3)

## 2013-02-11 LAB — CBC
HCT: 30.9 % — ABNORMAL LOW (ref 39.0–52.0)
MCV: 86.1 fL (ref 78.0–100.0)
RBC: 3.59 MIL/uL — ABNORMAL LOW (ref 4.22–5.81)
RDW: 13.8 % (ref 11.5–15.5)
WBC: 16.5 10*3/uL — ABNORMAL HIGH (ref 4.0–10.5)

## 2013-02-11 LAB — GLUCOSE, CAPILLARY
Glucose-Capillary: 156 mg/dL — ABNORMAL HIGH (ref 70–99)
Glucose-Capillary: 167 mg/dL — ABNORMAL HIGH (ref 70–99)
Glucose-Capillary: 147 mg/dL — ABNORMAL HIGH (ref 70–99)

## 2013-02-11 MED ORDER — INSULIN ASPART 100 UNIT/ML ~~LOC~~ SOLN
0.0000 [IU] | SUBCUTANEOUS | Status: DC
  Administered 2013-02-11 – 2013-02-12 (×4): 2 [IU] via SUBCUTANEOUS
  Administered 2013-02-12: 22:00:00 via SUBCUTANEOUS
  Administered 2013-02-13 (×3): 2 [IU] via SUBCUTANEOUS
  Administered 2013-02-14: 4 [IU] via SUBCUTANEOUS

## 2013-02-11 MED ORDER — TAMSULOSIN HCL 0.4 MG PO CAPS
0.4000 mg | ORAL_CAPSULE | Freq: Every day | ORAL | Status: DC
  Administered 2013-02-11 – 2013-02-14 (×4): 0.4 mg via ORAL
  Filled 2013-02-11 (×4): qty 1

## 2013-02-11 MED ORDER — INSULIN ASPART 100 UNIT/ML ~~LOC~~ SOLN
0.0000 [IU] | SUBCUTANEOUS | Status: DC
  Administered 2013-02-11: 2 [IU] via SUBCUTANEOUS
  Administered 2013-02-11 (×2): 4 [IU] via SUBCUTANEOUS

## 2013-02-11 MED ORDER — PROMETHAZINE HCL 25 MG/ML IJ SOLN
12.5000 mg | Freq: Four times a day (QID) | INTRAMUSCULAR | Status: DC | PRN
  Administered 2013-02-11: 12.5 mg via INTRAVENOUS
  Filled 2013-02-11: qty 1

## 2013-02-11 NOTE — Op Note (Signed)
Tony [name], HIPPE NO.:  [id number]  MEDICAL RECORD NO.:  [id number]  LOCATION:  2305                         FACILITY:  [facility]  PHYSICIAN:  Kerin Perna, M.D.  DATE OF BIRTH:  02-11-1941  DATE OF PROCEDURE:  02/09/2013 DATE OF DISCHARGE:                              OPERATIVE REPORT   OPERATION: 1. Coronary artery bypass grafting x4 (left internal mammary artery to     left anterior descending, saphenous vein graft to right coronary     artery, sequential saphenous vein graft to obtuse marginal 1 and     distal circumflex). 2. Endoscopic harvest of right leg greater saphenous vein.  SURGEON:  Kerin Perna, M.D.  ASSISTANT:  Rowe Clack, PA-C.  PREOPERATIVE DIAGNOSIS:  Severe three-vessel coronary artery disease, left main stenosis, unstable angina.  POSTOPERATIVE DIAGNOSIS:  Severe three-vessel coronary artery disease, left main stenosis, unstable angina.  ANESTHESIA:  General by Dr. Laverle Hobby.  INDICATIONS:  The patient is a 72 year old gentleman who presented to the hospital with unstable angina and cardiac catheterization by Dr. Antoine Poche demonstrated severe left main and three-vessel disease.  He was stabilized and kept in the hospital for recommended surgical coronary revascularization.  Prior to surgery, I examined the patient in his hospital room and reviewed results of his cardiac catheterization with the patient and his family.  I discussed the indications, expected benefits of coronary artery bypass grafting for treatment of his left main, and severe 3- vessel coronary artery disease.  I discussed with him the details of surgery including the location of the surgical incisions, the use of general anesthesia and cardiopulmonary bypass, and the expected postoperative recovery.  I discussed with him the risks to him of coronary artery bypass grafting including risks of stroke, bleeding, MI, infection, bleeding, and death.  He  demonstrated his understanding and agreed to proceed with surgery under what I felt was an informed consent.  OPERATIVE FINDINGS: 1. Good quality conduit. 2. Small coronaries consistent with his diabetic disease, mismatch     between the vein graft size to the right coronary and right     coronary vessel, which was a nondominant vessel. 3. No blood products required intraoperatively. 4. Improved LV global function following separation from     cardiopulmonary bypass.  OPERATIVE PROCEDURE:  The patient was brought to the operating room and placed supine on the operating table where general anesthesia was induced under invasive hemodynamic monitoring.  A transesophageal 2D echo probe was placed by the anesthesiologist.  A proper time-out was performed.  The patient was prepped and draped as a sterile field.  A sternal incision was made as the saphenous vein was harvested endoscopically from the right leg.  The left internal mammary artery was harvested as a pedicle graft from its origin at the subclavian vessels and it was a good vessel with excellent flow.  The sternal retractor was placed and the pericardium was opened and suspended.  Pursestrings were placed in the ascending aorta and right atrium and after the vein was harvested, the patient was systemically heparinized.  The ACT was documented as being therapeutic.  The patient was then cannulated and placed  on cardiopulmonary bypass and cooled to 32 degrees.  The coronary arteries were identified for grafting.  The right coronary, distal LAD, occluded OM 1, and distal circumflex were found to be adequate targets. The distal circumflex was small vessel approximately 1.2 mm.  The right coronary was a small vessel approximately 1.2 mm in diameter.  Cardioplegia cannulas were placed for both antegrade aortic and retrograde coronary sinus cardioplegia.  A crossclamp was applied.  1 L of cold blood cardioplegia was delivered in split  doses between the aortic and coronary sinus catheters, and there was good cardioplegic arrest.  Septal temperature dropped less than 15 degrees and cardioplegia was delivered every 20 minutes or less while the crossclamp was placed.  The distal coronary anastomoses were then performed.  First distal anastomosis was the right coronary.  This was a 1.2-mm vessel, and a reverse saphenous vein was sewn end-to-side with running 7- 0 Prolene.  There was a mismatch between the large size of the vein and the smaller size of the coronary, but there was good flow.  The second distal anastomosis was a part of the sequential vein graft to the OM 1 and OM 2.  The OM 1 was chronically occluded.  It was intramyocardial.  It was a 1.5-mm vessel with a probe passing well distally.  A side-to-side anastomosis with the saphenous vein was constructed using running 7-0 Prolene.  There was good flow through the graft.  The third distal anastomosis was a continuation of the sequential vein graft to the distal circumflex.  It was a small 1.0 mm vessel in the posterior descending distribution.  The end of the vein was sewn end-to- side with running 7-0 Prolene with good flow through the graft. Cardioplegia was redosed.  The fourth distal anastomosis was to the distal LAD.  It was a 1.5-mm vessel.  The left IMA pedicle was brought through an opening in the left lateral pericardium, it was brought down to the LAD and sewn end-to-side with running 8-0 Prolene.  There was excellent flow through the anastomosis after briefly releasing the pedicle bulldog on the mammary artery.  The bulldog was reapplied and the pedicle was secured to the epicardium.  Cardioplegia was redosed.  While the crossclamp was still in place, 2 proximal vein anastomoses were performed on the ascending aorta with a 4.5 mm punch and running 7- 0 Prolene.  Air was vented from the coronaries with a dose of retrograde warm blood  cardioplegia prior to removing the crossclamp.  The heart resumed a spontaneous rhythm.  The proximal and distal anastomoses were checked and found to be hemostatic.  The patient was rewarmed and reperfused.  Temporary pacing wires were applied.  The lungs were expanded and the ventilator was resumed.  When the patient was stable, he was weaned from bypass without Inotropes.  A 2D echo showed improved global LV function, no significant MR.  Protamine was administered without adverse reaction.  The cannulas were removed.  The mediastinum was irrigated.  Hemostasis was adequate.  The superior pericardial fat was closed over the aorta.  Anterior mediastinal and left pleural chest tube were placed and brought out through separate incisions.  The sternum was closed with interrupted steel wire.  The pectoralis fascia was closed in running #1 Vicryl.  The subcutaneous and skin layers were closed in running Vicryl, and sterile dressings were applied.  Total cardiopulmonary bypass time was 150 minutes.     Kerin Perna, M.D.     PV/MEDQ  D:  02/10/2013  T:  02/10/2013  Job:  [id number]  cc:   [name], MD, [facility]

## 2013-02-11 NOTE — Progress Notes (Signed)
Patient stated that he had the urge to urinate, Patient was assisted to sit on the side was given a urinal, after several minutes of trying to initiate stream, patient stated he feels pressure and he needs to void however he is having trouble.  Patient was able to stand up to see if that would help, it did not.  Patient was placed back in bed and he said he would try again.

## 2013-02-11 NOTE — Progress Notes (Signed)
Patient has not spontaneously voided since being I and O cath at 2315. Patient does not feel like he needs to void, denies urgency, frequency, and discomfort.  Bladder Scan was done, approx 400cc were present.  Will proceed to ambulate patient to see that helps, since patient is not in any distress from not being able to void, will hold off on other interventions.

## 2013-02-11 NOTE — Progress Notes (Signed)
Patient ID: Tony [name], male   DOB: 1941-08-03, 72 y.o.   MRN: [id number] TCTS DAILY ICU PROGRESS NOTE                   [address]            [place] [address]          [phone]   2 Days Post-Op Procedure(s) (LRB): CORONARY ARTERY BYPASS GRAFTING (CABG) (N/A) INTRAOPERATIVE TRANSESOPHAGEAL ECHOCARDIOGRAM (N/A)  Total Length of Stay:  LOS: 6 days   Subjective: Unable to void   Objective: Vital signs in last 24 hours: Temp:  [97.4 F (36.3 C)-98.6 F (37 C)] 98.1 F (36.7 C) (06/18 0804) Pulse Rate:  [72-90] 75 (06/18 0700) Cardiac Rhythm:  [-] Normal sinus rhythm (06/17 2000) Resp:  [10-26] 17 (06/18 0700) BP: (99-159)/(60-93) 124/66 mmHg (06/18 0700) SpO2:  [90 %-97 %] 95 % (06/18 0700) Arterial Line BP: (114-128)/(57-67) 128/62 mmHg (06/17 1200) Weight:  [236 lb 5.3 oz (107.2 kg)] 236 lb 5.3 oz (107.2 kg) (06/18 0500)  Filed Weights   02/09/13 0409 02/10/13 0500 02/11/13 0500  Weight: 221 lb (100.245 kg) 233 lb 14.5 oz (106.1 kg) 236 lb 5.3 oz (107.2 kg)    Weight change: 2 lb 6.8 oz (1.1 kg)   Hemodynamic parameters for last 24 hours: PAP: (23-31)/(12-17) 23/12 mmHg  Intake/Output from previous day: 06/17 0701 - 06/18 0700 In: 1373.7 [P.O.:600; I.V.:623.7; IV Piggyback:150] Out: 1830 [Urine:1800; Chest Tube:30]  Intake/Output this shift:    Current Meds: Scheduled Meds: . acetaminophen  1,000 mg Oral Q6H   Or  . acetaminophen (TYLENOL) oral liquid 160 mg/5 mL  975 mg Per Tube Q6H  . aspirin EC  325 mg Oral Daily  . bisacodyl  10 mg Oral Daily   Or  . bisacodyl  10 mg Rectal Daily  . docusate sodium  200 mg Oral Daily  . furosemide  40 mg Oral Daily  . insulin aspart  0-24 Units Subcutaneous Q6H  . metoprolol tartrate  12.5 mg Oral BID   Or  . metoprolol tartrate  12.5 mg Per Tube BID  . pantoprazole  40 mg Oral Daily  . pantoprazole  40 mg Oral QAC breakfast  . potassium chloride  20 mEq Oral Daily  . simvastatin  20 mg  Oral q1800  . sodium chloride  3 mL Intravenous Q12H  . sodium chloride  3 mL Intravenous Q12H  . tamsulosin  0.4 mg Oral Daily   Continuous Infusions: . sodium chloride 20 mL/hr at 02/09/13 1900  . sodium chloride 20 mL/hr at 02/09/13 2000  . sodium chloride    . dexmedetomidine Stopped (02/10/13 0300)  . DOPamine Stopped (02/09/13 1400)  . lactated ringers Stopped (02/10/13 1000)  . nitroGLYCERIN Stopped (02/09/13 1600)  . phenylephrine (NEO-SYNEPHRINE) Adult infusion Stopped (02/10/13 0931)   PRN Meds:.sodium chloride, acetaminophen, alum & mag hydroxide-simeth, bisacodyl, bisacodyl, magnesium hydroxide, metoprolol, midazolam, morphine injection, ondansetron (ZOFRAN) IV, ondansetron (ZOFRAN) IV, ondansetron, oxyCODONE, oxyCODONE, sodium chloride, sodium chloride, traMADol  General appearance: alert, cooperative and mild distress Neurologic: intact Heart: regular rate and rhythm, S1, S2 normal, no murmur, click, rub or gallop and normal apical impulse Lungs: clear to auscultation bilaterally Abdomen: soft, non-tender; bowel sounds normal; no masses,  no organomegaly Extremities: extremities normal, atraumatic, no cyanosis or edema and Homans sign is negative, no sign of DVT Wound: stable  Lab Results: CBC: Recent Labs  02/10/13 1620 02/11/13 0435  WBC 15.9*  16.5*  HGB 11.0* 10.5*  HCT 31.9* 30.9*  PLT 122* 140*   BMET:  Recent Labs  02/10/13 0318 02/10/13 1618 02/10/13 1620 02/11/13 0435  NA 136 136  --  133*  K 3.8 4.4  --  4.4  CL 104 102  --  99  CO2 23  --   --  25  GLUCOSE 123* 173*  --  159*  BUN 14 17  --  18  CREATININE 0.83 1.10 0.99 0.84  CALCIUM 8.1*  --   --  8.9    PT/INR:  Recent Labs  02/09/13 1300  LABPROT 14.7  INR 1.17   Radiology: Dg Chest Port 1 View  02/11/2013   *RADIOLOGY REPORT*  Clinical Data: Coronary artery disease.  Recent CABG.  PORTABLE CHEST - 1 VIEW  Comparison: 06/17 and 02/09/2013  Findings: Swan-Ganz catheter and left  chest tube have been removed. No pneumothorax.  There are new small bilateral effusions with minimal atelectasis at the lung bases.  Pulmonary vascularity is normal.  IMPRESSION: New small bilateral effusions.   Original Report Authenticated By: Francene Boyers, M.D.   Dg Chest Portable 1 View In Am  02/10/2013   *RADIOLOGY REPORT*  Clinical Data: Coronary atherosclerosis  PORTABLE CHEST - 1 VIEW  Comparison: 02/09/2013  Findings: Postsurgical changes are again identified.  A Swan-Ganz catheter is noted in the pulmonary outflow tract.  The heart remains enlarged in size.  A left-sided thoracostomy catheter is unchanged.  No pneumothorax is noted.  The endotracheal tube and nasogastric catheter have been removed in the interval.  The mediastinal drain is not as well seen.  No focal infiltrate is seen.  IMPRESSION: Postsurgical changes with tubes and lines as described.  No acute abnormality is noted.   Original Report Authenticated By: Alcide Clever, M.D.   Dg Chest Portable 1 View  02/09/2013   *RADIOLOGY REPORT*  Clinical Data: Postop CABG  PORTABLE CHEST - 1 VIEW  Comparison: 02/05/2013  Findings: Grossly unchanged enlarged cardiac silhouette and mediastinal contours post median sternotomy and CABG.  There is persistent mild tortuosity of the thoracic aorta.  Atherosclerotic calcifications within the thoracic aorta.  Endotracheal tube overlies the tracheal air column with tip superior to the carina.  Right jugular approach PA catheter tip overlies the main pulmonary artery outflow tract.  Mediastinal drains and left sided chest tube.  No pneumothorax.  There is minimal perihilar opacities favored to represent atelectasis.  No definite pleural effusion or evidence of edema.  Unchanged bones.  IMPRESSION: 1.  Post median sternotomy and CABG with appropriately positioned support apparatus as above.  No pneumothorax. 2.  Minimal perihilar atelectasis.  No evidence of edema.   Original Report Authenticated By: Tacey Ruiz, MD     Assessment/Plan: S/P Procedure(s) (LRB): CORONARY ARTERY BYPASS GRAFTING (CABG) (N/A) INTRAOPERATIVE TRANSESOPHAGEAL ECHOCARDIOGRAM (N/A) Acute urinary retention, will start Flomax and replace foley Stood to try to urinates and felt faint     Hildreth Robart B 02/11/2013 8:33 AM

## 2013-02-11 NOTE — Progress Notes (Signed)
Patient ID: Tony [name], male   DOB: 1941/05/17, 72 y.o.   MRN: [id number] EVENING ROUNDS NOTE :     [address]       [place] [address]             [phone]                 2 Days Post-Op Procedure(s) (LRB): CORONARY ARTERY BYPASS GRAFTING (CABG) (N/A) INTRAOPERATIVE TRANSESOPHAGEAL ECHOCARDIOGRAM (N/A)  Total Length of Stay:  LOS: 6 days  BP 130/75  Pulse 72  Temp(Src) 98.6 F (37 C) (Oral)  Resp 16  Ht 5\' 11"  (1.803 m)  Wt 236 lb 5.3 oz (107.2 kg)  BMI 32.98 kg/m2  SpO2 93%  .Intake/Output     06/17 0701 - 06/18 0700 06/18 0701 - 06/19 0700   P.O. 600 440   I.V. (mL/kg) 623.7 (5.8) 180 (1.7)   Blood     NG/GT     IV Piggyback 200    Total Intake(mL/kg) 1423.7 (13.3) 620 (5.8)   Urine (mL/kg/hr) 1800 (0.7) 700 (0.6)   Emesis/NG output     Blood     Chest Tube 30 (0)    Total Output 1830 700   Net -406.3 -80          . sodium chloride 20 mL/hr at 02/09/13 1900  . sodium chloride 20 mL/hr at 02/09/13 2000  . sodium chloride    . dexmedetomidine Stopped (02/10/13 0300)  . DOPamine Stopped (02/09/13 1400)  . lactated ringers Stopped (02/10/13 1000)  . nitroGLYCERIN Stopped (02/09/13 1600)  . phenylephrine (NEO-SYNEPHRINE) Adult infusion Stopped (02/10/13 0931)     Lab Results  Component Value Date   WBC 16.5* 02/11/2013   HGB 10.5* 02/11/2013   HCT 30.9* 02/11/2013   PLT 140* 02/11/2013   GLUCOSE 159* 02/11/2013   CHOL 210* 02/06/2013   TRIG 377* 02/06/2013   HDL 32* 02/06/2013   LDLCALC 103* 02/06/2013   ALT 15 02/11/2013   AST 29 02/11/2013   NA 133* 02/11/2013   K 4.4 02/11/2013   CL 99 02/11/2013   CREATININE 0.84 02/11/2013   BUN 18 02/11/2013   CO2 25 02/11/2013   INR 1.17 02/09/2013   HGBA1C 6.7* 02/06/2013   Nausea, feels light headed whenstands Also hiccups 475 when foley placed  zofran for nausea   Delight Ovens MD  Beeper [phone] Office [phone] 02/11/2013 6:17 PM

## 2013-02-11 NOTE — Progress Notes (Signed)
Patient stated he would try to urinate again, the same process was done, patient was still unable to void, upon that time patient's bladder was scanned, it showed approx. 700 cc of urine.  Due to patient's level of discomfort and since he had 700 cc of urine in his bladder, In and Out Cath was done.  Following aseptic technique, I and O was done, 625 cc of urine returned and patient felt immediate relief.  Will continue to monitor

## 2013-02-12 LAB — URINE MICROSCOPIC-ADD ON

## 2013-02-12 LAB — GLUCOSE, CAPILLARY
Glucose-Capillary: 134 mg/dL — ABNORMAL HIGH (ref 70–99)
Glucose-Capillary: 144 mg/dL — ABNORMAL HIGH (ref 70–99)
Glucose-Capillary: 150 mg/dL — ABNORMAL HIGH (ref 70–99)
Glucose-Capillary: 38 mg/dL — CL (ref 70–99)

## 2013-02-12 LAB — TYPE AND SCREEN
Antibody Screen: NEGATIVE
Unit division: 0

## 2013-02-12 LAB — URINALYSIS, ROUTINE W REFLEX MICROSCOPIC
Glucose, UA: NEGATIVE mg/dL
Nitrite: NEGATIVE
Specific Gravity, Urine: 1.025 (ref 1.005–1.030)
pH: 6 (ref 5.0–8.0)

## 2013-02-12 MED ORDER — CHLORPROMAZINE HCL 10 MG PO TABS
10.0000 mg | ORAL_TABLET | Freq: Three times a day (TID) | ORAL | Status: DC | PRN
  Administered 2013-02-12 – 2013-02-13 (×3): 10 mg via ORAL
  Filled 2013-02-12 (×4): qty 1

## 2013-02-12 MED ORDER — BENAZEPRIL HCL 10 MG PO TABS
10.0000 mg | ORAL_TABLET | Freq: Every day | ORAL | Status: DC
  Administered 2013-02-12 – 2013-02-13 (×2): 10 mg via ORAL
  Filled 2013-02-12 (×3): qty 1

## 2013-02-12 MED ORDER — GLIPIZIDE 2.5 MG HALF TABLET
2.5000 mg | ORAL_TABLET | Freq: Every day | ORAL | Status: DC
  Administered 2013-02-12 – 2013-02-13 (×2): 2.5 mg via ORAL
  Filled 2013-02-12 (×3): qty 1

## 2013-02-12 NOTE — Progress Notes (Signed)
CARDIAC REHAB PHASE I   PRE:  Rate/Rhythm: 72 SR    BP: sitting 122/62    SaO2: 89-90 2L  MODE:  Ambulation: 450 ft   POST:  Rate/Rhythm: 85 SR with PVC    BP: sitting 140/64     SaO2: 100 2L  Pt able to ambulate with RW and 2L O2, assist x1. Seemed to tolerate fairly well. No major c/o. Hiccups relieved with ice cream before walk. Return to bed after walk to finish nap. SaO2 low in bed, seemed good walking.Will f/u. [phone]   Elissa Lovett Ashley CES, ACSM 02/12/2013 2:42 PM

## 2013-02-12 NOTE — Progress Notes (Addendum)
[facility] DAILY ICU PROGRESS NOTE                   [address]            [place] [address]          [phone]   3 Days Post-Op Procedure(s) (LRB): CORONARY ARTERY BYPASS GRAFTING (CABG) (N/A) INTRAOPERATIVE TRANSESOPHAGEAL ECHOCARDIOGRAM (N/A)  Total Length of Stay:  LOS: 7 days   Subjective: C/o hiccups, no dysuria with foley in place, nausea at times but taking meds on empty stomach   Objective: Vital signs in last 24 hours: Temp:  [97.9 F (36.6 C)-98.8 F (37.1 C)] 98.3 F (36.8 C) (06/19 0420) Pulse Rate:  [67-86] 79 (06/19 0500) Cardiac Rhythm:  [-] Normal sinus rhythm (06/19 0400) Resp:  [12-23] 18 (06/19 0500) BP: (113-164)/(63-75) 152/69 mmHg (06/19 0500) SpO2:  [91 %-96 %] 94 % (06/19 0500)  Filed Weights   02/09/13 0409 02/10/13 0500 02/11/13 0500  Weight: 221 lb (100.245 kg) 233 lb 14.5 oz (106.1 kg) 236 lb 5.3 oz (107.2 kg)    Weight change:    Hemodynamic parameters for last 24 hours:    Intake/Output from previous day: 06/18 0701 - 06/19 0700 In: 800 [P.O.:620; I.V.:180] Out: 1590 [Urine:1590]  Intake/Output this shift:    Current Meds: Scheduled Meds: . acetaminophen  1,000 mg Oral Q6H   Or  . acetaminophen (TYLENOL) oral liquid 160 mg/5 mL  975 mg Per Tube Q6H  . aspirin EC  325 mg Oral Daily  . bisacodyl  10 mg Oral Daily   Or  . bisacodyl  10 mg Rectal Daily  . docusate sodium  200 mg Oral Daily  . furosemide  40 mg Oral Daily  . insulin aspart  0-24 Units Subcutaneous Q4H  . metoprolol tartrate  12.5 mg Oral BID   Or  . metoprolol tartrate  12.5 mg Per Tube BID  . pantoprazole  40 mg Oral Daily  . pantoprazole  40 mg Oral QAC breakfast  . potassium chloride  20 mEq Oral Daily  . simvastatin  20 mg Oral q1800  . sodium chloride  3 mL Intravenous Q12H  . sodium chloride  3 mL Intravenous Q12H  . tamsulosin  0.4 mg Oral Daily   Continuous Infusions: . sodium chloride 20 mL/hr at 02/09/13 1900  . sodium chloride  20 mL/hr at 02/09/13 2000  . sodium chloride    . dexmedetomidine Stopped (02/10/13 0300)  . DOPamine Stopped (02/09/13 1400)  . lactated ringers Stopped (02/10/13 1000)  . nitroGLYCERIN Stopped (02/09/13 1600)  . phenylephrine (NEO-SYNEPHRINE) Adult infusion Stopped (02/10/13 0931)   PRN Meds:.sodium chloride, acetaminophen, alum & mag hydroxide-simeth, bisacodyl, bisacodyl, magnesium hydroxide, metoprolol, midazolam, morphine injection, ondansetron (ZOFRAN) IV, ondansetron (ZOFRAN) IV, ondansetron, oxyCODONE, oxyCODONE, promethazine, sodium chloride, sodium chloride, traMADol  General appearance: alert, cooperative and no distress Heart: regular rate and rhythm Lungs: dim in bases Abdomen: soft, nontender Extremities: some LE edema Wound: incisions healing well  Lab Results: CBC: Recent Labs  02/10/13 1620 02/11/13 0435  WBC 15.9* 16.5*  HGB 11.0* 10.5*  HCT 31.9* 30.9*  PLT 122* 140*   BMET:  Recent Labs  02/10/13 0318 02/10/13 1618 02/10/13 1620 02/11/13 0435  NA 136 136  --  133*  K 3.8 4.4  --  4.4  CL 104 102  --  99  CO2 23  --   --  25  GLUCOSE 123* 173*  --  159*  BUN 14 17  --  18  CREATININE 0.83 1.10 0.99 0.84  CALCIUM 8.1*  --   --  8.9    PT/INR:  Recent Labs  02/09/13 1300  LABPROT 14.7  INR 1.17   Radiology: Dg Chest Port 1 View  02/11/2013   *RADIOLOGY REPORT*  Clinical Data: Coronary artery disease.  Recent CABG.  PORTABLE CHEST - 1 VIEW  Comparison: 06/17 and 02/09/2013  Findings: Swan-Ganz catheter and left chest tube have been removed. No pneumothorax.  There are new small bilateral effusions with minimal atelectasis at the lung bases.  Pulmonary vascularity is normal.  IMPRESSION: New small bilateral effusions.   Original Report Authenticated By: Francene Boyers, M.D.     Assessment/Plan: S/P Procedure(s) (LRB): CORONARY ARTERY BYPASS GRAFTING (CABG) (N/A) INTRAOPERATIVE TRANSESOPHAGEAL ECHOCARDIOGRAM (N/A) Mobilize Diuresis Diabetes  control Continue foley due to acute urinary retention tx to 2000 Try thorazine for hiccups Check ua Reorder home glipizide and lotensin at 1/2 dose for now GOLD,Tony [name]  Still with hiccups To 2000 today/ d/c foley in [name] and agree with the above assessment  and plan.  Delight Ovens MD Beeper [phone] Office [phone] 02/12/2013 8:13 [name]

## 2013-02-12 NOTE — Progress Notes (Signed)
Transferred to 2008 via wheelchair after ambulating 34ft.  Tolerated ambulation well.  Vitals stable.  Placed on monitor and on O2 upon arrival to unit.  Wife at bedside.

## 2013-02-13 LAB — GLUCOSE, CAPILLARY
Glucose-Capillary: 127 mg/dL — ABNORMAL HIGH (ref 70–99)
Glucose-Capillary: 169 mg/dL — ABNORMAL HIGH (ref 70–99)
Glucose-Capillary: 131 mg/dL — ABNORMAL HIGH (ref 70–99)
Glucose-Capillary: 148 mg/dL — ABNORMAL HIGH (ref 70–99)
Glucose-Capillary: 106 mg/dL — ABNORMAL HIGH (ref 70–99)

## 2013-02-13 MED ORDER — LACTULOSE 10 GM/15ML PO SOLN
20.0000 g | Freq: Every day | ORAL | Status: DC | PRN
  Filled 2013-02-13: qty 30

## 2013-02-13 MED ORDER — POLYETHYLENE GLYCOL 3350 17 G PO PACK
17.0000 g | PACK | Freq: Once | ORAL | Status: AC
  Administered 2013-02-13: 17 g via ORAL
  Filled 2013-02-13: qty 1

## 2013-02-13 MED ORDER — FUROSEMIDE 10 MG/ML IJ SOLN
40.0000 mg | Freq: Two times a day (BID) | INTRAMUSCULAR | Status: DC
  Administered 2013-02-13 – 2013-02-14 (×4): 40 mg via INTRAVENOUS
  Filled 2013-02-13 (×5): qty 4

## 2013-02-13 NOTE — Progress Notes (Addendum)
      [address]       [place] [address]             [phone]      4 Days Post-Op Procedure(s) (LRB): CORONARY ARTERY BYPASS GRAFTING (CABG) (N/A) INTRAOPERATIVE TRANSESOPHAGEAL ECHOCARDIOGRAM (N/A)  Subjective:  Mr. Ishmael continues to complain of hiccups.  Thorazine provides some relief.  He has not had a BM and is requesting a laxative.    Objective: Vital signs in last 24 hours: Temp:  [98 F (36.7 C)-99.3 F (37.4 C)] 98.6 F (37 C) (06/20 0427) Pulse Rate:  [74-84] 81 (06/20 0427) Cardiac Rhythm:  [-] Normal sinus rhythm (06/19 2020) Resp:  [16-23] 18 (06/20 0427) BP: (113-143)/(55-73) 113/63 mmHg (06/20 0427) SpO2:  [92 %-95 %] 92 % (06/20 0427) Weight:  [239 lb 13.8 oz (108.8 kg)] 239 lb 13.8 oz (108.8 kg) (06/20 0427)  Intake/Output from previous day: 06/19 0701 - 06/20 0700 In: 303 [P.O.:300; I.V.:3] Out: 1180 [Urine:1180]  General appearance: alert, cooperative and no distress Heart: regular rate and rhythm Lungs: clear to auscultation bilaterally Abdomen: soft, non-tender, + distention, +BS Extremities: edema 1-2+, ecchymosis Wound: clean and dry  Lab Results:  Recent Labs  02/10/13 1620 02/11/13 0435  WBC 15.9* 16.5*  HGB 11.0* 10.5*  HCT 31.9* 30.9*  PLT 122* 140*   BMET:  Recent Labs  02/10/13 1618 02/10/13 1620 02/11/13 0435  NA 136  --  133*  K 4.4  --  4.4  CL 102  --  99  CO2  --   --  25  GLUCOSE 173*  --  159*  BUN 17  --  18  CREATININE 1.10 0.99 0.84  CALCIUM  --   --  8.9    PT/INR: No results found for this basename: LABPROT, INR,  in the last 72 hours ABG    Component Value Date/Time   PHART 7.359 02/09/2013 2113   HCO3 21.8 02/09/2013 2113   TCO2 24 02/10/2013 1618   ACIDBASEDEF 3.0* 02/09/2013 2113   O2SAT 96.0 02/09/2013 2113   CBG (last 3)   Recent Labs  02/12/13 2136 02/12/13 2329 02/13/13 0424  GLUCAP 122* 111* 127*    Assessment/Plan: S/P Procedure(s) (LRB): CORONARY ARTERY  BYPASS GRAFTING (CABG) (N/A) INTRAOPERATIVE TRANSESOPHAGEAL ECHOCARDIOGRAM (N/A)  1. CV- NSR good rate and pressure control- continue Lopressor, will d/c EPW this morning 2. Pulm- on oxygen will wean as tolerated, encouraged use of IS, repeat CXR to assess pleural effusions 3. Renal- creatinine ok, volume overloaded weight is elevated 18 lbs since admission, will give IV lasix 4. DM- sugars controlled, continue Glipizide 5. LOC Constipation- will order Lactulose 6. Dispo- patient stable, d/c EPW, Foley today, need to diuresis better, hopefully d/c in next 24-48 hours   LOS: 8 days    BARRETT, ERIN 02/13/2013  Patient seen and examined. Agree with above. Will try miralax

## 2013-02-13 NOTE — Progress Notes (Signed)
CARDIAC REHAB PHASE I   PRE:  Rate/Rhythm: 73 Sr  BP:  Supine:   Sitting: 140/62  Standing:    SaO2: 95 2L 85 RA  MODE:  Ambulation: 550 ft   POST:  Rate/Rhythm: 83  BP:  Supine:   Sitting: 150/70  Standing:    SaO2: 92 2L 1030-1130 On arrival pt in bed on O2 2L sat 95%. O2 discontinued room air sat dropped to 85%. O2 reapplied at 2L sat improved slowly  To 92%. Pt very slow to get started to walk and ask for foley catheter to be taken out before walking. Pt c/o of no sleep and of being sore all over incisional pain. Able to get pt 550 feet he moaned during walk and states that he was hurting. Pt to recliner after with call light in reach. O2 sat after walk 92% on 2L, encouraged use of IS. Instructed pt that he needs 24 hour care for a week at discharge.  Melina Copa RN 02/13/2013 11:30 AM

## 2013-02-13 NOTE — Discharge Summary (Signed)
[address]       [place] [address]             [phone]       Tony [name] 11/26/1940 72 y.o. [id number]  02/05/2013   Kerin Perna, MD  cad CAD left main    History of Present Illness: The patient is a 72 year old white male who was admitted for evaluation of class III angina. He has a history of coronary artery disease since 1993 and has been managed medically. Recently has increased symptoms including exertional chest pain. He does not have any rest pain symptoms.It  also does radiate to his right arm and he does have some abdominal discomfort as well. He was admitted for cardiac catheterization to assist with further management.   Past Medical History   Diagnosis  Date   .  Diabetes mellitus  Age 41     Type 2   .  Hypertension    .  Hyperlipidemia    .  Arthritis    .  Joint pain    .  Leg pain    .  Gout    .  PVD (peripheral vascular disease)      SFA 50%, stenosis right profunda   .  Coronary artery disease    .  Anginal pain     Past Surgical History   Procedure  Laterality  Date   .  Back surgery   2010     lumbar   .  Cervical spine surgery      History    Social History   .  Marital Status:  Married     Spouse Name:  N/A     Number of Children:  N/A   .  Years of Education:  N/A    Occupational History   .  Not on file.    Social History Main Topics   .  Smoking status:  Former Smoker -- 3.00 packs/day for 40 years     Types:  Cigarettes     Quit date:  05/27/1992   .  Smokeless tobacco:  Never Used   .  Alcohol Use:  No   .  Drug Use:  No   .  Sexually Active:  Not on file    Other Topics  Concern   .  Not on file    Social History Narrative    Lives at home with wife.          Family History   Problem  Relation  Age of Onset   .  Heart failure  Mother  65   .  Cancer  Sister     No current facility-administered medications on file prior to encounter.    Current Outpatient Prescriptions on  File Prior to Encounter   Medication  Sig  Dispense  Refill   .  amLODipine (NORVASC) 5 MG tablet  Take 5 mg by mouth at bedtime.     .  benazepril (LOTENSIN) 20 MG tablet  Take 20 mg by mouth at bedtime.     Marland Kitchen  glipiZIDE (GLUCOTROL) 5 MG tablet  Take 5 mg by mouth at bedtime.     .  metoprolol tartrate (LOPRESSOR) 25 MG tablet  Take 0.5 tablets (12.5 mg total) by mouth 2 (two) times daily.  30 tablet  6    Allergies   Allergen  Reactions   .  Aspirin  Causes gout flare   .  Cortisone  Nausea And Vomiting     Hospital Course:  The patient underwent cardiac catheterization and was found to have significant critical coronary artery disease including 80% left main, 95% ostial LAD, 100% obtuse marginal #2 and right coronary artery with 60-70% proximal focal stenosis. Ejection fraction was estimated at 65%. Surgical consultation was obtained with Dr Kathlee Nations Trigt who evaluated the patient and studies and recommended surgical coronary artery revascularization. On 02/09/2013 he was taken the operating room at which time he underwent the following procedure:  OPERATIVE REPORT  OPERATION:  1. Coronary artery bypass grafting x4 (left internal mammary artery to  left anterior descending, saphenous vein graft to right coronary  artery, sequential saphenous vein graft to obtuse marginal 1 and  distal circumflex).  2. Endoscopic harvest of right leg greater saphenous vein.  SURGEON: Kerin Perna, M.D.  ASSISTANT: Rowe Clack, PA-C.  PREOPERATIVE DIAGNOSIS: Severe three-vessel coronary artery disease,  left main stenosis, unstable angina.  POSTOPERATIVE DIAGNOSIS: Severe three-vessel coronary artery disease,  left main stenosis, unstable angina.  ANESTHESIA: General by Dr. Laverle Hobby.   The patient was taken to the surgical intensive care he in stable condition.  Postoperative hospital course: The patient was extubated the evening of surgery without difficulty. He remained afebrile  and hemodynamically stable. Theone Murdoch, a line, chest tubes, and foley were removed early in the post operative course. Lopressor was started and titrated accordingly. He was volume over loaded and diuresed. He was weaned off the insulin drip. Once he was tolerating a diet, home diabetic medicine was restarted. The patient's HGA1C pre op was  6.7. He did have acute urinary retenetion. He was started on Flomax and it resolved. He also had hiccups. He was given Thorazine and they stopped.The patient was felt surgically stable for transfer from the ICU to 2000 for further convalescence on 02/12/2013. He continued to progress with cardiac rehab. He was ambulating on room air, but he did desat to 88% with ambulation. Home oxygen (2 liters via [place]) was arranged. He has been tolerating a diet and has had a bowel movement. Epicardial pacing wires and chest tube sutures will be removed prior to discharge. Provided the patient remains afebrile, hemodynamically stable, and pending morning round evaluation, he will be surgically stable for discharge on 02/14/2013.   No results found for this basename: NA, K, CL, CO2, GLUCOSE, BUN, CRATININE, CALCIUM,  in the last 72 hours No results found for this basename: WBC, HGB, HCT, PLT,  in the last 72 hours No results found for this basename: INR,  in the last 72 hours   Discharge Instructions:  The patient is discharged to home with extensive instructions on wound care and progressive ambulation.  They are instructed not to drive or perform any heavy lifting until returning to see the physician in his office.  Discharge Diagnosis:  cad CAD left main  Secondary Diagnosis: Patient Active Problem List   Diagnosis Date Noted  . Unstable angina 02/01/2013   Past Medical History  Diagnosis Date  . Diabetes mellitus Age 8    Type 2  . Hypertension   . Hyperlipidemia   . Arthritis   . Joint pain   . Leg pain   . Gout   . PVD (peripheral vascular disease)     SFA  50%, stenosis right profunda  . Coronary artery disease   . Anginal pain        Follow-up Information  Follow up with VAN Dinah Beers, MD. (3 weeks-the office will contact you to arrange this appointment. Please also obtain a chest x-ray at [facility] imaging 1 hour prior to appointment with surgeon. [facility] is located in the same office complex)    Contact information:   [address] [place] [address] [phone]       Follow up with [name], MD. (2 weeks-please contact the office to arrange.)    Contact information:   [address] [place] [address] [phone]      Discharge Medications:   Medication List    STOP taking these medications       amLODipine 5 MG tablet  Commonly known as:  NORVASC      TAKE these medications       benazepril 10 MG tablet  Commonly known as:  LOTENSIN  Take 1 tablet (10 mg total) by mouth at bedtime.     colchicine 0.6 MG tablet  Take 0.6 mg by mouth daily as needed (gout).     furosemide 40 MG tablet  Commonly known as:  LASIX  Take 1 tablet (40 mg total) by mouth daily. For one week then stop.     glipiZIDE 5 MG tablet  Commonly known as:  GLUCOTROL  Take 5 mg by mouth at bedtime.     methocarbamol 750 MG tablet  Commonly known as:  ROBAXIN  Take 750 mg by mouth 3 (three) times daily as needed (muscle spasm).     metoprolol tartrate 25 MG tablet  Commonly known as:  LOPRESSOR  Take 0.5 tablets (12.5 mg total) by mouth 2 (two) times daily.     oxyCODONE 5 MG immediate release tablet  Commonly known as:  Oxy IR/ROXICODONE  Take 1 tablet (5 mg total) by mouth every 4 (four) hours as needed for pain.     potassium chloride SA 20 MEQ tablet  Commonly known as:  K-DUR,KLOR-CON  Take 1 tablet (20 mEq total) by mouth daily. For one week then stop.     simvastatin 20 MG tablet  Commonly known as:  ZOCOR  Take 1 tablet (20 mg total) by  mouth daily at 6 PM.     tamsulosin 0.4 MG Caps  Commonly known as:  FLOMAX  Take 1 capsule (0.4 mg total) by mouth daily.          Disposition: Discharged homeThe patient has been discharged on:   1.Beta Blocker:  Yes [ x  ]                              No   [   ]                              If No, reason:  2.Ace Inhibitor/ARB: Yes [ x  ]                                     No  [    ]                                     If No, reason:  3.Statin:   Yes [  x  ]                  No  [   ]                  If No, reason:  4.Ecasa:  Yes  [   ]                  No   [x   ]                  If No, reason: Patient with allergy and refuses to take   Patient's condition is Good  Gershon Crane, PA-C 02/14/2013  10:01 AM

## 2013-02-13 NOTE — Progress Notes (Signed)
Nutrition Brief Note  Patient identified on the Malnutrition Screening Tool (MST) Report.  Per readings below, patient's when has been stable.  Wt Readings from Last 20 Encounters:  02/13/13 239 lb 13.8 oz (108.8 kg)  02/13/13 239 lb 13.8 oz (108.8 kg)  02/05/13 232 lb (105.235 kg)  02/05/13 232 lb (105.235 kg)  01/30/13 232 lb 12.8 oz (105.597 kg)    Body mass index is 33.47 kg/(m^2). Patient meets criteria for Obesity Class I based on current BMI.   Current diet order is Heart Healthy, patient is consuming approximately 75% of meals at this time. Labs and medications reviewed.   No nutrition interventions warranted at this time. If nutrition issues arise, please consult RD.   Maureen Chatters, RD, LDN Pager #: [phone] After-Hours Pager #: [phone]

## 2013-02-13 NOTE — Progress Notes (Signed)
EPW d/c'd per MD order and unit protocol.  Last INR 1.16 and no ectopy noted on the monitor. Pt tolerated well with all tips intact. Will continue to monitor closely. Lurae Hornbrook, Avie Echevaria , RN

## 2013-02-14 ENCOUNTER — Inpatient Hospital Stay (HOSPITAL_COMMUNITY): Payer: Medicare Other

## 2013-02-14 LAB — GLUCOSE, CAPILLARY: Glucose-Capillary: 120 mg/dL — ABNORMAL HIGH (ref 70–99)

## 2013-02-14 MED ORDER — OXYCODONE HCL 5 MG PO TABS: 5.0000 mg | ORAL_TABLET | ORAL | Status: DC | PRN

## 2013-02-14 MED ORDER — BENAZEPRIL HCL 10 MG PO TABS: 10.0000 mg | ORAL_TABLET | Freq: Every day | ORAL | Status: DC

## 2013-02-14 MED ORDER — FUROSEMIDE 40 MG PO TABS: 40.0000 mg | ORAL_TABLET | Freq: Every day | ORAL | Status: DC

## 2013-02-14 MED ORDER — POTASSIUM CHLORIDE CRYS ER 20 MEQ PO TBCR: 20.0000 meq | EXTENDED_RELEASE_TABLET | Freq: Every day | ORAL | Status: DC

## 2013-02-14 MED ORDER — FLEET ENEMA 7-19 GM/118ML RE ENEM
1.0000 | ENEMA | Freq: Once | RECTAL | Status: DC
  Filled 2013-02-14: qty 1

## 2013-02-14 MED ORDER — GLIPIZIDE 5 MG PO TABS
5.0000 mg | ORAL_TABLET | Freq: Every day | ORAL | Status: DC
  Filled 2013-02-14: qty 1

## 2013-02-14 MED ORDER — TAMSULOSIN HCL 0.4 MG PO CAPS: 0.4000 mg | ORAL_CAPSULE | Freq: Every day | ORAL | Status: DC

## 2013-02-14 MED ORDER — SIMVASTATIN 20 MG PO TABS: 20.0000 mg | ORAL_TABLET | Freq: Every day | ORAL | Status: DC

## 2013-02-14 MED ORDER — ASPIRIN 325 MG PO TBEC: 325.0000 mg | DELAYED_RELEASE_TABLET | Freq: Every day | ORAL | Status: DC

## 2013-02-14 NOTE — Progress Notes (Addendum)
      [address]       [place] [address]             [phone]        5 Days Post-Op Procedure(s) (LRB): CORONARY ARTERY BYPASS GRAFTING (CABG) (N/A) INTRAOPERATIVE TRANSESOPHAGEAL ECHOCARDIOGRAM (N/A)  Subjective: Patient states his hiccups "are gone". He is passing flatus but no bowel movement yet, despite laxatives. Requesting fleet enema. Patient states he really wants to go home  Objective: Vital signs in last 24 hours: Temp:  [97.2 F (36.2 C)-98.3 F (36.8 C)] 98.3 F (36.8 C) (06/21 0602) Pulse Rate:  [68-75] 75 (06/21 0602) Cardiac Rhythm:  [-] Normal sinus rhythm (06/20 2015) Resp:  [18-20] 18 (06/21 0602) BP: (108-128)/(56-79) 128/79 mmHg (06/21 0602) SpO2:  [92 %-93 %] 93 % (06/21 0602) Weight:  [105.5 kg (232 lb 9.4 oz)] 105.5 kg (232 lb 9.4 oz) (06/21 0602)  Pre op weight  100.2 kg Current Weight  02/14/13 105.5 kg (232 lb 9.4 oz)      Intake/Output from previous day: 06/20 0701 - 06/21 0700 In: 240 [P.O.:240] Out: 1950 [Urine:1950]   Physical Exam:  Cardiovascular: RRR, no murmurs, gallops, or rubs. Pulmonary: Diminished at bases; no rales, wheezes, or rhonchi. Abdomen: Soft, non tender, distended,bowel sounds present. Extremities: Mild bilateral lower extremity edema. Wounds: Clean and dry.  No erythema or signs of infection.  Lab Results: CBC:No results found for this basename: WBC, HGB, HCT, PLT,  in the last 72 hours BMET: No results found for this basename: NA, K, CL, CO2, GLUCOSE, BUN, CREATININE, CALCIUM,  in the last 72 hours  PT/INR:  Lab Results  Component Value Date   INR 1.17 02/09/2013   INR 0.90 02/05/2013   INR 0.9 01/30/2013   ABG:  INR: Will add last result for INR, ABG once components are confirmed Will add last 4 CBG results once components are confirmed  Assessment/Plan:  1. CV - SR. On Lopressor 12.5 bid, Lotensin 10 daily 2.  Pulmonary - On 2 liters of oxygen via [place]-wean as tolerates. He does  desat with ambulation on room air. Encourage incentive spirometer 3. Volume Overload - On Lasix 40 IV bid 4.  Acute blood loss anemia - Last H and H 10.5 and 30.9 5.DM-CBGs  148/106/120. Pre op HGA1C 6.5. On Glipizide 2.5 daily, but will increase to pre op dose of 5 for better glucose control. 6.Mild thrombocytopenia-last platelet count up to 140,000 7.Fleet enema for constipation 8.Remove sutures 9.Patient is allergic to aspirin (he states it gives him gout). He has been refusing it in here and states will not take upon discharge 10. Possible discharge later today vs am  ZIMMERMAN,DONIELLE MPA-C 02/14/2013,7:40 AM  Off O2. + BM Home today

## 2013-02-14 NOTE — Care Management Note (Addendum)
Cm spoke with pt concerning HH needs. Pt states no HHRN required. [facility] dme rep notified for home oxygen therapy. DME delivery scheduled to room prior to discharge. No other needs assessed.   Roxy Manns Aileene Lanum,RN,BSN [phone]

## 2013-02-14 NOTE — Progress Notes (Signed)
CARDIAC REHAB PHASE I   PRE:  Rate/Rhythm: 86 sinus  BP:  Sitting: 122/66     SaO2: 90 RA  MODE:  Ambulation: 550 ft 88% RA  POST:  Rate/Rhythem: 122  BP:  Sitting: 132/64     SaO2: 92 RA  Pt ambulated 550 ft with assist x1.  After 100 ft SaO2 dropped to 88%.  Pt did recover with rest and pursed lip breathing, but would encourage O2 to maintain 90% with exercise.  Pt tolerated walk well.  Completed d/c education: diet, exercise, restrictions, sternal precautions, and when to call 911/MD.  Pt and wife voiced understanding.  Discussed Phase II with pt and wife, pt request to have info sent to [facility] for Rehab.  Thanks for the referral. Fabio Pierce, MA, ACSM RCEP [phone]  Hazle Nordmann

## 2013-02-14 NOTE — Progress Notes (Signed)
D/C CTS per protocol and as ordered, steris applied.  Will continue to monitor 

## 2013-02-16 ENCOUNTER — Telehealth: Payer: Self-pay | Admitting: *Deleted

## 2013-02-16 DIAGNOSIS — I2510 Atherosclerotic heart disease of native coronary artery without angina pectoris: Secondary | ICD-10-CM

## 2013-02-16 MED ORDER — CLOPIDOGREL BISULFATE 75 MG PO TABS: 75.0000 mg | ORAL_TABLET | Freq: Every day | ORAL | Status: DC

## 2013-02-16 NOTE — Telephone Encounter (Signed)
He is unable to take aspirin.  Dr. Donata Clay has therefore prescribed Plavix for him and I am notifying him of this. He understands.  He also says he cannot take the Zocor that was prescribed for him and Dr. Antoine Poche is aware.  I told him that was O.K. He can discuss this with him on his follow up visit.

## 2013-02-18 ENCOUNTER — Other Ambulatory Visit: Payer: Self-pay | Admitting: *Deleted

## 2013-02-18 DIAGNOSIS — G8918 Other acute postprocedural pain: Secondary | ICD-10-CM

## 2013-02-18 MED ORDER — TRAMADOL HCL 50 MG PO TABS: 50.0000 mg | ORAL_TABLET | Freq: Four times a day (QID) | ORAL | Status: DC | PRN

## 2013-02-23 ENCOUNTER — Ambulatory Visit: Payer: Medicare Other | Admitting: Cardiology

## 2013-02-23 ENCOUNTER — Telehealth: Payer: Self-pay | Admitting: Cardiology

## 2013-02-23 NOTE — Telephone Encounter (Signed)
New Problem:    Patient called in because for the past two days he has felt clammy, lightheaded and his pulse has been sporatic and would like to know how to proceed.  Please call back.

## 2013-02-23 NOTE — Telephone Encounter (Signed)
Per pt - reports for the past two days he has been having a bad time.  Reports he woke this AM at 5 and couldn't feel his pulse and his BP was 105/? Which is low for him.  He reports having cold sweats and  And being clammy.  02 sat was 97%.  For the past hour his HR has been in the 90's and he is feeling better.  His normal HR he reports is around 80 or below.  While he reports feeling better now he states that if this reoccurs he will call EMS to be checked out.  Pt was given an appt to see Dr Antoine Poche in [facility] Wednesday at 10 am.  He will call back before then if he has any problems.

## 2013-02-25 ENCOUNTER — Encounter: Payer: Self-pay | Admitting: Cardiology

## 2013-02-25 ENCOUNTER — Encounter: Payer: Medicare Other | Admitting: Cardiology

## 2013-02-25 ENCOUNTER — Ambulatory Visit (INDEPENDENT_AMBULATORY_CARE_PROVIDER_SITE_OTHER): Payer: Medicare Other | Admitting: Cardiology

## 2013-02-25 VITALS — BP 165/86 | HR 92 | Ht 70.0 in | Wt 219.8 lb

## 2013-02-25 DIAGNOSIS — I25810 Atherosclerosis of coronary artery bypass graft(s) without angina pectoris: Secondary | ICD-10-CM

## 2013-02-25 DIAGNOSIS — R012 Other cardiac sounds: Secondary | ICD-10-CM

## 2013-02-25 MED ORDER — HYDROCODONE-ACETAMINOPHEN 5-325 MG PO TABS: 1.0000 | ORAL_TABLET | Freq: Four times a day (QID) | ORAL | Status: DC | PRN

## 2013-02-25 NOTE — Patient Instructions (Addendum)
Please stop Amlodipine, Plavix, Lasix, Potassium and ASA. Take Colchine 0.6 mg twice a day. May take Vicodin for pain. Continue all other medications as listed.  A chest x-ray takes a picture of the organs and structures inside the chest, including the heart, lungs, and blood vessels. This test can show several things, including, whether the heart is enlarges; whether fluid is building up in the lungs; and whether pacemaker / defibrillator leads are still in place.  Your physician has requested that you have an echocardiogram. Echocardiography is a painless test that uses sound waves to create images of your heart. It provides your doctor with information about the size and shape of your heart and how well your heart's chambers and valves are working. This procedure takes approximately one hour. There are no restrictions for this procedure.  Follow up as scheduled.

## 2013-02-25 NOTE — Progress Notes (Signed)
HPI The patient presents for followup after CABG. He had a class III angina. Cardiac cath demonstrated 80% left main stenosis, 95% ostial LAD stenosis, occluded second marginal and 60-70% proximal RCA stenosis. He underwent CABG as described below. Unfortunately he has not done as well postoperatively he is at low. His nausea. He's had bilateral shoulder discomfort. He's had some weakness in his left hand particularly the fifth fourth and third fingers and some numbness there. His usual sternal discomfort. However, he has more significant discomfort with particular movements of his left arm. He has had nausea but not vomiting. He's had fluctuating blood pressures. He said some of the nausea he thinks was associated with Plavix. Unfortunately he can't take aspirin as it causes severe gout.  He's not having any significant fevers and had only one episode of chills. He's had some lightheadedness associated with a low blood pressure recently and some fluctuating heart rates. He actually held off on taking any of his blood pressure medicines yesterday but took them today. He hasn't taken Plavix.   Allergies  Allergen Reactions  . Aspirin     Causes gout flare  . Cortisone Nausea And Vomiting    Current Outpatient Prescriptions  Medication Sig Dispense Refill  . amLODipine (NORVASC) 5 MG tablet       . benazepril (LOTENSIN) 10 MG tablet Take 1 tablet (10 mg total) by mouth at bedtime.  30 tablet  1  . clopidogrel (PLAVIX) 75 MG tablet Take 1 tablet (75 mg total) by mouth daily.  30 tablet  0  . furosemide (LASIX) 40 MG tablet Take 1 tablet (40 mg total) by mouth daily. For one week then stop.  7 tablet  0  . glipiZIDE (GLUCOTROL) 5 MG tablet Take 5 mg by mouth at bedtime.       . metoprolol tartrate (LOPRESSOR) 25 MG tablet Take 0.5 tablets (12.5 mg total) by mouth 2 (two) times daily.  30 tablet  6  . potassium chloride SA (K-DUR,KLOR-CON) 20 MEQ tablet Take 1 tablet (20 mEq total) by mouth daily.  For one week then stop.  7 tablet  1  . traMADol (ULTRAM) 50 MG tablet Take 1-2 tablets (50-100 mg total) by mouth every 6 (six) hours as needed for pain.  40 tablet  0   No current facility-administered medications for this visit.    Past Medical History  Diagnosis Date  . Diabetes mellitus Age 42    Type 2  . Hypertension   . Hyperlipidemia   . Arthritis   . Joint pain   . Leg pain   . Gout   . PVD (peripheral vascular disease)     SFA 50%, stenosis right profunda  . Coronary artery disease   . Anginal pain     Past Surgical History  Procedure Laterality Date  . Back surgery  2010    lumbar  . Cervical spine surgery    . Coronary artery bypass graft N/A 02/09/2013    Procedure: CORONARY ARTERY BYPASS GRAFTING (CABG);  Surgeon: Kerin Perna, MD;  Location: [facility] OR;  Service: Open Heart Surgery;  Laterality: N/A;  . Intraoperative transesophageal echocardiogram N/A 02/09/2013    Procedure: INTRAOPERATIVE TRANSESOPHAGEAL ECHOCARDIOGRAM;  Surgeon: Kerin Perna, MD;  Location: [facility] OR;  Service: Open Heart Surgery;  Laterality: N/A;    ROS:  As stated in the HPI and negative for all other systems.  PHYSICAL EXAM BP 165/86  Pulse 92  Ht 5\' 10"  (1.778 m)  Wt 219 lb 12.8 oz (99.701 kg)  BMI 31.54 kg/m2 GENERAL:  Well appearing HEENT:  Pupils equal round and reactive, fundi not visualized, oral mucosa unremarkable NECK:  No jugular venous distention, waveform within normal limits, carotid upstroke brisk and symmetric, no bruits, no thyromegaly LYMPHATICS:  No cervical, inguinal adenopathy LUNGS:  Clear to auscultation bilaterally BACK:  No CVA tenderness CHEST:  Healing sternal wound without sternal mobility or drainage HEART:  PMI not displaced or sustained,S1 and S2 within normal limits, no S3, no S4, no clicks, positive for at the apex, no murmurs ABD:  Flat, positive bowel sounds normal in frequency in pitch, no bruits, no rebound, no guarding, no midline pulsatile mass,  no hepatomegaly, no splenomegaly EXT:  2 plus pulses throughout, right greater than left lower extremity edema with healing right wrist scars, no cyanosis no clubbing SKIN:  No rashes no nodules NEURO:  Cranial nerves II through XII grossly intact, motor grossly intact throughout PSYCH:  Cognitively intact, oriented to person place and time   EKG:  Intervals within normal limits, nonspecific inferolateral T wave inversions.  T-wave inversion is somewhat new since the previous but there is resolution of some mild ST elevation that he had postoperatively. This could represent evolution of pericarditis. 02/25/2013   ASSESSMENT AND PLAN  CAD/CABG:  I suspect that he had some pericarditis the right groin some of his symptoms. I don't want to start him on nonsteroidal at this point and he cannot take aspirin. I will give him colchicine. We will check an echocardiogram. I would also like to send him for chest x-ray, CBC and basic metabolic profile.  He will not be taking aspirin and I will actually hold on Plavix.  HTN:  There was some discrepancy with his bottles of medications and his discharge list. Specifically he was taking Norvasc but this was discontinued in the hospital. I will have him stop this because of the recent hypotension but he will continue the other meds as listed with the exception of Plavix. He can take his diuretic when necessary.  ARM WEAKNESS:  He will discuss this with Dr. Donata Clay when he sees him in a few more days. If this persists, particularly because he is left handed and works with his hands, he might need EMG or nerve conduction studies.

## 2013-02-26 ENCOUNTER — Other Ambulatory Visit (HOSPITAL_COMMUNITY): Payer: Self-pay | Admitting: Cardiology

## 2013-02-26 DIAGNOSIS — I25810 Atherosclerosis of coronary artery bypass graft(s) without angina pectoris: Secondary | ICD-10-CM

## 2013-03-02 ENCOUNTER — Other Ambulatory Visit: Payer: Self-pay | Admitting: *Deleted

## 2013-03-02 ENCOUNTER — Ambulatory Visit (HOSPITAL_COMMUNITY): Payer: Medicare Other | Attending: Cardiology

## 2013-03-02 DIAGNOSIS — I2510 Atherosclerotic heart disease of native coronary artery without angina pectoris: Secondary | ICD-10-CM

## 2013-03-02 DIAGNOSIS — E119 Type 2 diabetes mellitus without complications: Secondary | ICD-10-CM | POA: Insufficient documentation

## 2013-03-02 DIAGNOSIS — R012 Other cardiac sounds: Secondary | ICD-10-CM | POA: Insufficient documentation

## 2013-03-02 DIAGNOSIS — I25810 Atherosclerosis of coronary artery bypass graft(s) without angina pectoris: Secondary | ICD-10-CM

## 2013-03-02 DIAGNOSIS — E785 Hyperlipidemia, unspecified: Secondary | ICD-10-CM | POA: Insufficient documentation

## 2013-03-02 DIAGNOSIS — I10 Essential (primary) hypertension: Secondary | ICD-10-CM | POA: Insufficient documentation

## 2013-03-02 NOTE — Progress Notes (Signed)
Echocardiogram performed.  

## 2013-03-03 NOTE — Discharge Summary (Signed)
patient examined and medical record reviewed,agree with above note. [name],Tony [name] 03/03/2013   

## 2013-03-04 ENCOUNTER — Ambulatory Visit: Payer: Medicare Other | Admitting: Cardiology

## 2013-03-04 ENCOUNTER — Other Ambulatory Visit: Payer: Self-pay | Admitting: *Deleted

## 2013-03-04 DIAGNOSIS — I2510 Atherosclerotic heart disease of native coronary artery without angina pectoris: Secondary | ICD-10-CM

## 2013-03-09 ENCOUNTER — Ambulatory Visit (INDEPENDENT_AMBULATORY_CARE_PROVIDER_SITE_OTHER): Payer: Self-pay | Admitting: Surgical

## 2013-03-09 ENCOUNTER — Ambulatory Visit
Admission: RE | Admit: 2013-03-09 | Discharge: 2013-03-09 | Disposition: A | Discharge status: Home / Self Care | Payer: Medicare Other | Source: Ambulatory Visit | Attending: Cardiothoracic Surgery | Admitting: Cardiothoracic Surgery

## 2013-03-09 VITALS — BP 140/86 | HR 86 | Resp 20 | Ht 70.0 in | Wt 219.0 lb

## 2013-03-09 DIAGNOSIS — I2510 Atherosclerotic heart disease of native coronary artery without angina pectoris: Secondary | ICD-10-CM

## 2013-03-09 DIAGNOSIS — Z951 Presence of aortocoronary bypass graft: Secondary | ICD-10-CM

## 2013-03-09 MED ORDER — BENAZEPRIL HCL 10 MG PO TABS: 10.0000 mg | ORAL_TABLET | Freq: Every day | ORAL | Status: DC

## 2013-03-09 NOTE — Patient Instructions (Signed)
The patient may return to driving an automobile as long as they are no longer requiring oral narcotic pain relievers during the daytime.  It would be wise to start driving only short distances during the daylight and gradually increase from there as they feel comfortable.The patient should continue to avoid any heavy lifting or strenuous use of arms or shoulders for at least a total of three months from the time of surgery. 

## 2013-03-09 NOTE — Progress Notes (Signed)
[address]       [place] [address]             [phone]                  [place] Medical Record #[id number] Date of Birth: June 10, 1941  Tony Rotunda, MD Ignatius Specking., MD  Chief Complaint:   PostOp Follow Up Visit   History of Present Illness:    The patient is seen in routine followup after coronary artery bypass grafting x4 on 02/09/2013 by Dr. Donata Clay. This was done for severe three-vessel coronary artery disease as well as left main stenosis and with stable angina. He reports he is feeling well. His only concern is his left hand. He continues to have numbness in the ulnar distribution. He also has some weakness associated with it. He denies fevers, chills or other constitutional symptoms. He is ambulating daily around his home which is approximately 2 acres.           History  Smoking status  . Former Smoker -- 3.00 packs/day for 40 years  . Types: Cigarettes  . Quit date: 05/27/1992  Smokeless tobacco  . Never Used       Allergies  Allergen Reactions  . Aspirin     Causes gout flare  . Cortisone Nausea And Vomiting    Current Outpatient Prescriptions  Medication Sig Dispense Refill  . benazepril (LOTENSIN) 10 MG tablet Take 1 tablet (10 mg total) by mouth at bedtime.  30 tablet  1  . glipiZIDE (GLUCOTROL) 5 MG tablet Take 5 mg by mouth at bedtime.       . metoprolol tartrate (LOPRESSOR) 25 MG tablet Take 0.5 tablets (12.5 mg total) by mouth 2 (two) times daily.  30 tablet  6   No current facility-administered medications for this visit.       Physical Exam: BP 140/86  Pulse 86  Resp 20  Ht 5\' 10"  (1.778 m)  Wt 219 lb (99.338 kg)  BMI 31.42 kg/m2  SpO2 97%  General appearance: alert, cooperative and no distress Neurologic: Numbness of the left and and fingers 3, 4, and 5. Motor function grossly intact however weak Heart: regular rate and rhythm and S1, S2 normal Lungs: clear to auscultation  bilaterally Abdomen: Soft, nontender, nondistended, positive bowel sounds Extremities: No edema Wound: Well-healed incisions without signs of infection Wounds:  Diagnostic Studies & Laboratory data:         Recent Radiology Findings: Dg Chest 2 View  03/09/2013   *RADIOLOGY REPORT*  Clinical Data: Bypass surgery.  CHEST - 2 VIEW  Comparison: 02/26/2013.  Findings: The cardiac silhouette, mediastinal and hilar contours are within normal limits and stable.  No edema or pneumothorax. There is a stable small left effusion.  IMPRESSION:  Stable small left pleural effusion.  No edema or infiltrates.   Original Report Authenticated By: Rudie Meyer, M.D.      Recent Labs: Lab Results  Component Value Date   WBC 16.5* 02/11/2013   HGB 10.5* 02/11/2013   HCT 30.9* 02/11/2013   PLT 140* 02/11/2013   GLUCOSE 159* 02/11/2013   CHOL 210* 02/06/2013   TRIG 377* 02/06/2013   HDL 32* 02/06/2013   LDLCALC 103* 02/06/2013   ALT 15 02/11/2013   AST 29 02/11/2013   NA 133* 02/11/2013   K 4.4 02/11/2013   CL 99 02/11/2013   CREATININE 0.84 02/11/2013   BUN 18 02/11/2013  CO2 25 02/11/2013   INR 1.17 02/09/2013   HGBA1C 6.7* 02/06/2013      Assessment / Plan:  Overall the patient is doing quite well . We discussed brachiocephalic stretch injury. I also discussed obtaining a neurology followup, however he wishes to wait on this. I instructed him on range of motion and general strengthening activities. I instructed him on driving protocol. He is not planning to do cardiac rehabilitation at this time but we'll continue daily ambulation and he appears to be making excellent progress with this. We will see him again in 3 weeks to reevaluate the progress with his left arm as he may need further studies including nerve conduction can neurology consult.          Demyah Smyre E 03/09/2013 1:39 PM

## 2013-03-12 ENCOUNTER — Encounter: Payer: Self-pay | Admitting: Cardiology

## 2013-03-12 ENCOUNTER — Ambulatory Visit (INDEPENDENT_AMBULATORY_CARE_PROVIDER_SITE_OTHER): Payer: Medicare Other | Admitting: Cardiology

## 2013-03-12 VITALS — BP 148/87 | HR 81 | Ht 71.0 in | Wt 220.8 lb

## 2013-03-12 DIAGNOSIS — I25810 Atherosclerosis of coronary artery bypass graft(s) without angina pectoris: Secondary | ICD-10-CM

## 2013-03-12 NOTE — Progress Notes (Signed)
HPI The patient presents for followup after CABG. He had a class III angina. Cardiac cath demonstrated 80% left main stenosis, 95% ostial LAD stenosis, occluded second marginal and 60-70% proximal RCA stenosis. He underwent CABG as described below. When I saw him last time he was having some shoulder discomfort in her rub consistent with some pericarditis though an echocardiogram demonstrated no effusion. I did treat him with colchicine and his symptoms have resolved. He had stopped taking Plavix because of some nausea. Finally he had complaints of numbness in 3 fingers on his left hand. He discussed this with cardiothoracic surgery office and they are planning on seeing him in a couple weeks in followup.  Allergies  Allergen Reactions  . Aspirin     Causes gout flare  . Cortisone Nausea And Vomiting    Current Outpatient Prescriptions  Medication Sig Dispense Refill  . benazepril (LOTENSIN) 10 MG tablet Take 1 tablet (10 mg total) by mouth at bedtime.  30 tablet  1  . glipiZIDE (GLUCOTROL) 5 MG tablet Take 5 mg by mouth at bedtime.       . metoprolol tartrate (LOPRESSOR) 25 MG tablet Take 0.5 tablets (12.5 mg total) by mouth 2 (two) times daily.  30 tablet  6   No current facility-administered medications for this visit.    Past Medical History  Diagnosis Date  . Diabetes mellitus Age 72    Type 2  . Hypertension   . Hyperlipidemia   . Arthritis   . Joint pain   . Leg pain   . Gout   . PVD (peripheral vascular disease)     SFA 50%, stenosis right profunda  . Coronary artery disease   . Anginal pain     Past Surgical History  Procedure Laterality Date  . Back surgery  2010    lumbar  . Cervical spine surgery    . Coronary artery bypass graft N/A 02/09/2013    Procedure: CORONARY ARTERY BYPASS GRAFTING (CABG);  Surgeon: Kerin Perna, MD;  Location[facility] OR;  Service: Open Heart Surgery;  Laterality: N/A;  LIMA to the LAD, SVG to right coronary artery, sequential SVG to OM  and distal circumflex.  . Intraoperative transesophageal echocardiogram N/A 02/09/2013    Procedure: INTRAOPERATIVE TRANSESOPHAGEAL ECHOCARDIOGRAM;  Surgeon: Kerin Perna, MD;  Location: [facility] OR;  Service: Open Heart Surgery;  Laterality: N/A;    ROS:  As stated in the HPI and negative for all other systems.  PHYSICAL EXAM BP 148/87  Pulse 81  Ht 5\' 11"  (1.803 m)  Wt 220 lb 12.8 oz (100.154 kg)  BMI 30.81 kg/m2 GENERAL:  Well appearing NECK:  No jugular venous distention, waveform within normal limits, carotid upstroke brisk and symmetric, no bruits, no thyromegaly LUNGS:  Clear to auscultation bilaterally BACK:  No CVA tenderness CHEST:  Healing sternal wound without sternal mobility or drainage HEART:  PMI not displaced or sustained,S1 and S2 within normal limits, no S3, no S4, no clicks,  no murmurs ABD:  Flat, positive bowel sounds normal in frequency in pitch, no bruits, no rebound, no guarding, no midline pulsatile mass, no hepatomegaly, no splenomegaly EXT:  2 plus pulses throughout, right greater than left lower extremity edema with healing right wrist scars, no cyanosis no clubbing   ASSESSMENT AND PLAN  CAD/CABG:  He seems to be doing a little bit better now. He's off a culture seen and I think probable mild pericarditis resolved. I would like him to restart the  Plavix as he is intolerant of aspirin.  HTN:  His blood pressure at home is well controlled. We will continue the meds as listed.  ARM WEAKNESS:  He this is likely to need EMG or nerve conduction studies.  He has followup with Dr. Donata Clay.

## 2013-03-12 NOTE — Patient Instructions (Addendum)
Restart Plavix 75 mg a day. Continue all other medications as listed.  Follow up in 4 months with Dr Antoine Poche.  You will receive a letter in the mail 2 months before you are due.  Please call us when you receive this letter to schedule your follow up appointment.

## 2013-03-18 ENCOUNTER — Telehealth: Payer: Self-pay | Admitting: Cardiology

## 2013-03-18 NOTE — Telephone Encounter (Signed)
New Prob  Pt states when he started back taking the Plavix he got diarrhea and started vomiting. He said he has stopped taking it. He said that even with his BP meds he is starting to get tunnel vision and he does not feel well after taking it. He wonders if the BP meds are too strong.

## 2013-03-19 NOTE — Telephone Encounter (Signed)
Pt restarted Plavix as instructed - took for 3 days.  He woke on the third night with nausea vomiting diarrhea.  The next morning he took his AM medications and became flushed, broke out in a cold sweat and became ill again.  He has stopped all of his medications and states that he and Dr Antoine Poche need to come to an agreement on what his medications are going to be because he is not going to be sick all the time like this.  Advised pt to monitor his BP and HR and that I will forward information to MD for his knowledge.  Pt was very addiment that he is not going to take these medications and interrupted me several times while trying to explain the possible reasons behind why he was feeling the way he was.  Advised again I will forward information to MD for review.

## 2013-03-20 NOTE — Telephone Encounter (Signed)
He can resume the benazepril that he was on previously.  He can stay off of the Plavix and beta blocker.

## 2013-03-20 NOTE — Telephone Encounter (Signed)
Spoke with patient to inform him of Dr. Jenene Slicker advice to resume Benazepril and stop Plavix and Metoprolol.  I advised patient to call the office if he has any further problems or concerns with these medication changes. Patient verbalized understanding and gratitude.

## 2013-04-01 ENCOUNTER — Encounter: Payer: Self-pay | Admitting: Cardiothoracic Surgery

## 2013-04-01 ENCOUNTER — Ambulatory Visit (INDEPENDENT_AMBULATORY_CARE_PROVIDER_SITE_OTHER): Payer: Self-pay | Admitting: Cardiothoracic Surgery

## 2013-04-01 VITALS — BP 180/94 | HR 78 | Resp 20 | Ht 71.0 in | Wt 224.0 lb

## 2013-04-01 DIAGNOSIS — Z951 Presence of aortocoronary bypass graft: Secondary | ICD-10-CM

## 2013-04-01 DIAGNOSIS — I2510 Atherosclerotic heart disease of native coronary artery without angina pectoris: Secondary | ICD-10-CM

## 2013-04-01 NOTE — Progress Notes (Signed)
PCP is VYAS,DHRUV B., MD Referring Provider is [name], MD  Chief Complaint  Patient presents with  . Routine Post Op    3 week f/u S/P CABG x 4 02/09/13    HPI: 72 year old hypertensive nondiabetic Caucasian male 6 weeks status post CABG. Doing well walking over a mile a day without angina. Incisions healing well.  Main complaint is weakness and numbness in the left hand and forearm. Better since discharge from the hospital. History of C-spine arthritis and neck surgery at [facility] 3-4 years ago for right-sided disc disease. We'll refer to physical therapy outpatient department at [facility]. Referral to orthopedics Dr. Andreas Blower for left arm symptoms in absence of left neck symptoms. Patient unable to take aspirin  because of allergic reaction  gout-arthritis. Otherwise taking his blood pressure medication and following a heart healthy diet. He has lost 15 pounds. Past Medical History  Diagnosis Date  . Diabetes mellitus Age 49    Type 2  . Hypertension   . Hyperlipidemia   . Arthritis   . Joint pain   . Leg pain   . Gout   . PVD (peripheral vascular disease)     SFA 50%, stenosis right profunda  . Coronary artery disease   . Anginal pain     Past Surgical History  Procedure Laterality Date  . Back surgery  2010    lumbar  . Cervical spine surgery    . Coronary artery bypass graft N/A 02/09/2013    Procedure: CORONARY ARTERY BYPASS GRAFTING (CABG);  Surgeon: Kerin Perna, MD;  Location: [facility] OR;  Service: Open Heart Surgery;  Laterality: N/A;  LIMA to the LAD, SVG to right coronary artery, sequential SVG to OM and distal circumflex.  . Intraoperative transesophageal echocardiogram N/A 02/09/2013    Procedure: INTRAOPERATIVE TRANSESOPHAGEAL ECHOCARDIOGRAM;  Surgeon: Kerin Perna, MD;  Location: [facility] OR;  Service: Open Heart Surgery;  Laterality: N/A;    Family History  Problem Relation Age of Onset  . Heart failure Mother 61  . Cancer Sister      Social History History  Substance Use Topics  . Smoking status: Former Smoker -- 3.00 packs/day for 40 years    Types: Cigarettes    Quit date: 05/27/1992  . Smokeless tobacco: Never Used  . Alcohol Use: No    Current Outpatient Prescriptions  Medication Sig Dispense Refill  . benazepril (LOTENSIN) 10 MG tablet Take 1 tablet (10 mg total) by mouth at bedtime.  30 tablet  1  . glipiZIDE (GLUCOTROL) 5 MG tablet Take 5 mg by mouth at bedtime.       . metoprolol tartrate (LOPRESSOR) 25 MG tablet Take 0.5 tablets (12.5 mg total) by mouth 2 (two) times daily.  30 tablet  6   No current facility-administered medications for this visit.    Allergies  Allergen Reactions  . Aspirin     Causes gout flare  . Cortisone Nausea And Vomiting    Review of Systems  BP 180/94  Pulse 78  Resp 20  Ht 5\' 11"  (1.803 m)  Wt 224 lb (101.606 kg)  BMI 31.26 kg/m2  SpO2 98% Physical Exam--blood pressure rechecked 168/80  alert and comfortable   lungs clear Heart rhythm regular without murmur or gallop Surgical incisions all well-healed No edema  Diagnostic Tests:  last chest x-ray made July reviewed showing small left pleural effusion otherwise clear  Impression:  doing well 6 weeks after CABG x4. Main complaint is left  arm numbness and weakness from the hand to the elbow. Will start physical therapy as outpatient and request ortho evaluation  Followup here when necessaryPlan:

## 2013-04-03 ENCOUNTER — Telehealth: Payer: Self-pay | Admitting: Cardiology

## 2013-04-03 NOTE — Telephone Encounter (Signed)
Per pt call - states he has been taking Metoprolol tartrate 25 mg 1/2 tablet twice a day and his Benazepril 10 mg daily as RXed and his BP and HR continue to be elevated.  He reports that 2 nights ago he woke with a terrible headache and his BP was 190/100 and his HR was in the 90's.  He is very concerned.  Advised to take extra Metoprolol once if needed to control HR and BP and I will discuss with Dr Antoine Poche when he returns.  Pt will call back if no improvement with extra Metoprolol should he have to use it.

## 2013-04-03 NOTE — Telephone Encounter (Signed)
New Prob  Pt states that he BP has been running very high. He said two nights ago it was 190/100 with hr 90.  He said it has not been under 160.  He said he woke up with a terrible headache.

## 2013-04-05 NOTE — Telephone Encounter (Signed)
The first step will be to increase the Metoprolol to 25 mg bid.  If BP remains elevated I will increase the Lotensin.

## 2013-04-07 NOTE — Telephone Encounter (Signed)
Left message to call back  

## 2013-04-07 NOTE — Telephone Encounter (Signed)
Spoke with patient and he has already increase his Metoprolol to 25 mg twice a day and blood pressure remains elevated in systolic 150's. Advised to increase Lotensin to 20 mg daily and call back if no better.

## 2013-04-13 ENCOUNTER — Telehealth: Payer: Self-pay | Admitting: Cardiology

## 2013-04-13 MED ORDER — METOPROLOL TARTRATE 25 MG PO TABS: 25.0000 mg | ORAL_TABLET | Freq: Two times a day (BID) | ORAL | Status: DC

## 2013-04-13 NOTE — Telephone Encounter (Signed)
Pt having numbness at incision site since procedure, had questions re meds and was told a nurse was told call him back kast week and he never heard from [place], wanted appt in [place], next opening 06-17-13, offered appt [place] tomorrow , pt declined, pls call

## 2013-04-13 NOTE — Telephone Encounter (Signed)
Spoke with pt who is complaining of continued numbness in left arm and chest incision. This has been addressed by Dr. Alla German and referral to PT and ortho was to be made.  Pt states he is unable to do PT due to cost and distance to drive to therapy. He would like to see ortho and would like to know status of this referral.  I have asked him to contact Dr. Zenaida Niece Tright's office regarding this.  Pt has increased metoprolol tartrate to 25 mg by mouth twice daily as previously instructed. He reports blood pressure is now running 130/70-78 and heart rate in 80's. He needs new prescription for this sent to his pharmacy.  [facility] in [place].

## 2013-04-30 ENCOUNTER — Other Ambulatory Visit: Payer: Self-pay | Admitting: *Deleted

## 2013-04-30 MED ORDER — BENAZEPRIL HCL 10 MG PO TABS: 20.0000 mg | ORAL_TABLET | Freq: Every day | ORAL | Status: DC

## 2013-07-29 ENCOUNTER — Ambulatory Visit (INDEPENDENT_AMBULATORY_CARE_PROVIDER_SITE_OTHER): Payer: Medicare Other | Admitting: Cardiology

## 2013-07-29 ENCOUNTER — Encounter: Payer: Self-pay | Admitting: *Deleted

## 2013-07-29 ENCOUNTER — Encounter: Payer: Self-pay | Admitting: Cardiology

## 2013-07-29 VITALS — BP 149/86 | HR 72 | Ht 71.0 in | Wt 232.0 lb

## 2013-07-29 DIAGNOSIS — I6523 Occlusion and stenosis of bilateral carotid arteries: Secondary | ICD-10-CM

## 2013-07-29 DIAGNOSIS — I200 Unstable angina: Secondary | ICD-10-CM

## 2013-07-29 DIAGNOSIS — I25810 Atherosclerosis of coronary artery bypass graft(s) without angina pectoris: Secondary | ICD-10-CM

## 2013-07-29 DIAGNOSIS — I6529 Occlusion and stenosis of unspecified carotid artery: Secondary | ICD-10-CM

## 2013-07-29 DIAGNOSIS — I658 Occlusion and stenosis of other precerebral arteries: Secondary | ICD-10-CM

## 2013-07-29 NOTE — Patient Instructions (Signed)

## 2013-07-29 NOTE — Progress Notes (Signed)
HPI The patient presents for followup after CABG earlier this year.   Postoperative he had problems with chest pain. He also had numbness in his left hand. The numbness in weakness finally went away.  He unfortunately continues to have some occasional sharp chest discomfort on the left side different than previous angina and residual from the sternotomy  It is not as severe as it was.   He is trying to stay active and does some walking daily. With this he denies any chest pressure, neck or arm discomfort. He has had no palpitations, presyncope or syncope. He has no shortness of breath, PND or orthopnea.  Allergies  Allergen Reactions  . Aspirin     Causes gout flare  . Cortisone Nausea And Vomiting    Current Outpatient Prescriptions  Medication Sig Dispense Refill  . benazepril (LOTENSIN) 10 MG tablet Take 2 tablets (20 mg total) by mouth at bedtime.  30 tablet  3  . glipiZIDE (GLUCOTROL) 5 MG tablet Take 5 mg by mouth at bedtime.       . naproxen (NAPROSYN) 250 MG tablet Take by mouth as needed.       No current facility-administered medications for this visit.    Past Medical History  Diagnosis Date  . Diabetes mellitus Age 72    Type 2  . Hypertension   . Hyperlipidemia   . Arthritis   . Joint pain   . Leg pain   . Gout   . PVD (peripheral vascular disease)     SFA 50%, stenosis right profunda  . Coronary artery disease   . Anginal pain     Past Surgical History  Procedure Laterality Date  . Back surgery  2010    lumbar  . Cervical spine surgery    . Coronary artery bypass graft N/A 02/09/2013    Procedure: CORONARY ARTERY BYPASS GRAFTING (CABG);  Surgeon: Kerin Perna, MD;  Location: [facility] OR;  Service: Open Heart Surgery;  Laterality: N/A;  LIMA to the LAD, SVG to right coronary artery, sequential SVG to OM and distal circumflex.  . Intraoperative transesophageal echocardiogram N/A 02/09/2013    Procedure: INTRAOPERATIVE TRANSESOPHAGEAL ECHOCARDIOGRAM;  Surgeon:  Kerin Perna, MD;  Location: [facility] OR;  Service: Open Heart Surgery;  Laterality: N/A;    ROS:  As stated in the HPI and negative for all other systems.  PHYSICAL EXAM BP 149/86  Pulse 72  Ht 5\' 11"  (1.803 m)  Wt 232 lb (105.235 kg)  BMI 32.37 kg/m2 GENERAL:  Well appearing NECK:  No jugular venous distention, waveform within normal limits, carotid upstroke brisk and symmetric, no bruits, no thyromegaly LUNGS:  Clear to auscultation bilaterally BACK:  No CVA tenderness CHEST:  Healing sternal wound without sternal mobility or drainage HEART:  PMI not displaced or sustained,S1 and S2 within normal limits, no S3, no S4, no clicks,  no murmurs ABD:  Flat, positive bowel sounds normal in frequency in pitch, no bruits, no rebound, no guarding, no midline pulsatile mass, no hepatomegaly, no splenomegaly EXT:  2 plus pulses throughout, right greater than left lower extremity edema with healing right wrist scars, no cyanosis no clubbing  EKG:   Sinus rhythm, rate 70, axis within normal limits, intervals within normal limits, old anteroseptal infarct, nonspecific diffuse T wave flattening , premature atrial contractions  07/29/2013  ASSESSMENT AND PLAN  CAD/CABG:  The patient has no new sypmtoms.  No further cardiovascular testing is indicated.  We will continue with aggressive  risk reduction and meds as listed.  HTN:  His blood pressure at home is well controlled at home although it is mildly elevated today. We will continue the meds as listed.  ARM WEAKNESS:  This has resolved  CAROTID STENOSIS:  He had carotid stenosis is documented elsewhere on his pre- CABG Dopplers. He needs repeat carotid Dopplers.  HYPERLIPIDEMIA:   He is intolerant avulsed and then cannot afford Zetia. His total cholesterol was 287 recently. He understands the need for diet control.

## 2013-08-06 ENCOUNTER — Encounter (INDEPENDENT_AMBULATORY_CARE_PROVIDER_SITE_OTHER): Payer: Medicare Other

## 2013-08-06 DIAGNOSIS — I6529 Occlusion and stenosis of unspecified carotid artery: Secondary | ICD-10-CM

## 2013-08-17 ENCOUNTER — Telehealth: Payer: Self-pay | Admitting: Cardiology

## 2013-08-17 NOTE — Telephone Encounter (Signed)
Pt aware of results of carotid doppler and to repeat in 6 months

## 2013-08-17 NOTE — Telephone Encounter (Signed)
New Problem: ° °Pt states he is calling for recent test results.  °

## 2013-09-07 ENCOUNTER — Other Ambulatory Visit: Payer: Self-pay

## 2013-09-07 MED ORDER — BENAZEPRIL HCL 10 MG PO TABS: 20.0000 mg | ORAL_TABLET | Freq: Every day | ORAL | Status: DC

## 2013-10-06 ENCOUNTER — Telehealth: Payer: Self-pay | Admitting: Cardiology

## 2013-10-06 NOTE — Telephone Encounter (Signed)
New Message  Pt called states that he BP is running high 170 to 150 and then goes back up again.. severe headaches.Marland Kitchen. aches in shoulders up to his right ear// Pt is worried// [place] app[place] [name] in [place] (Declined PA) please call back to assist.

## 2013-10-06 NOTE — Telephone Encounter (Signed)
Per pt - states last Thursday he noticed a headache on the right side of his head that went up into his eye.  He reports not being able to sleep last night because his head was "pounding."  His BP has been staying around 170-180/88 and his HR around 68 to 70.  Pt is taking Benazepril 10 mg twice a day, Metoprolol tartrate 25 mg QAM (he stopped it and restarted it to once a day in 08/2013).  Aware I will forward this information to Dr Antoine PocheHochrein for recommendations and/or orders.  He states he feels fine today but has not taken his BP yet.

## 2013-10-07 NOTE — Telephone Encounter (Signed)
I do not see the beta blocker listed on his meds.  However, he can continue to take this and can increase to bid.

## 2013-10-08 NOTE — Telephone Encounter (Signed)
Left message for pt to call back to discuss

## 2013-10-08 NOTE — Telephone Encounter (Signed)
Voicemail not set up yet and no answer at number listed.

## 2013-10-08 NOTE — Telephone Encounter (Signed)
Pt aware to increase metoprolol 25 mg to BID as originally ordered

## 2014-02-04 IMAGING — CR DG CHEST 2V
2 series · 2 of 2 positions shown · non-contrast
Comparison: 02/26/2013.

CLINICAL DATA: Bypass surgery.

CHEST - 2 VIEW

[w chest pa]
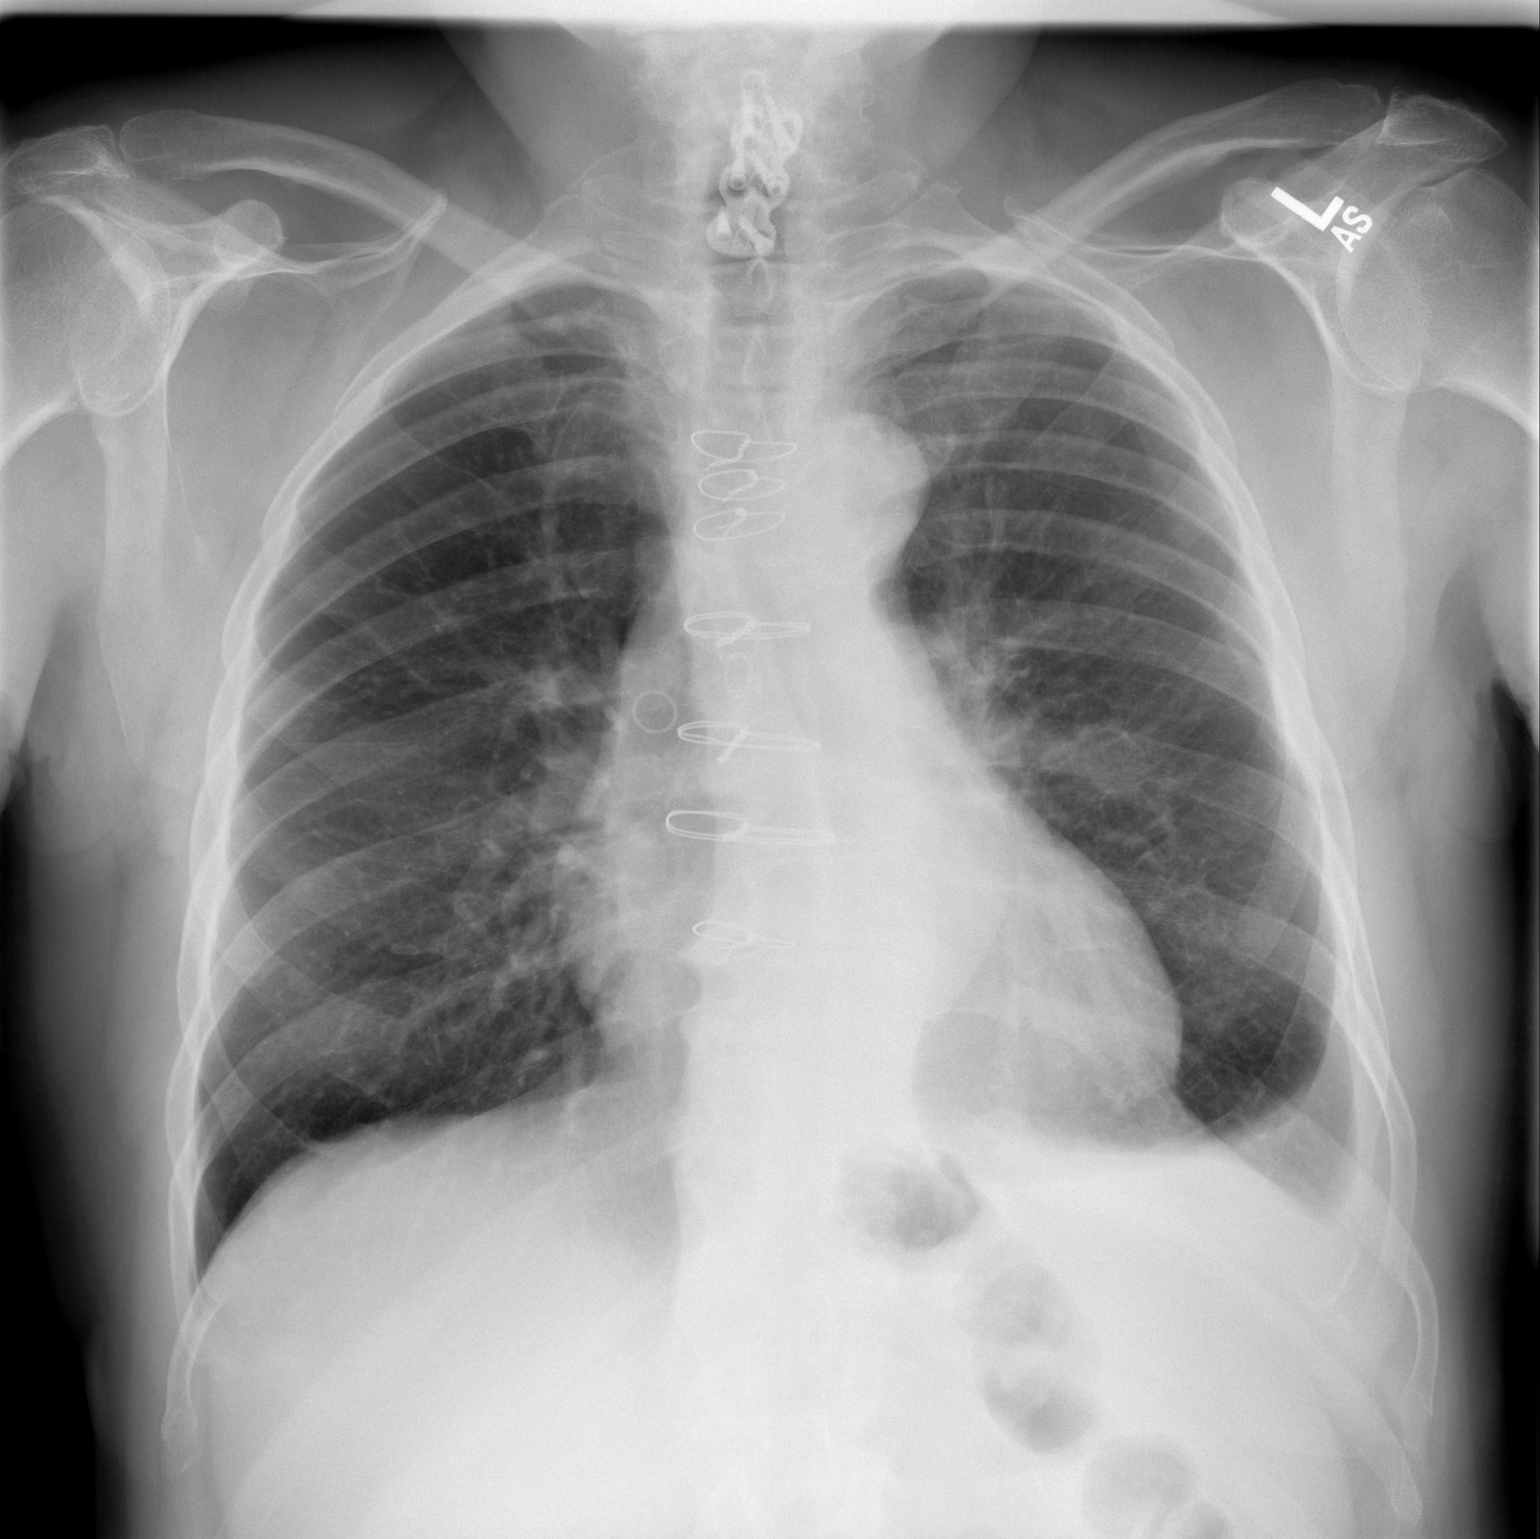

[w chest lat]
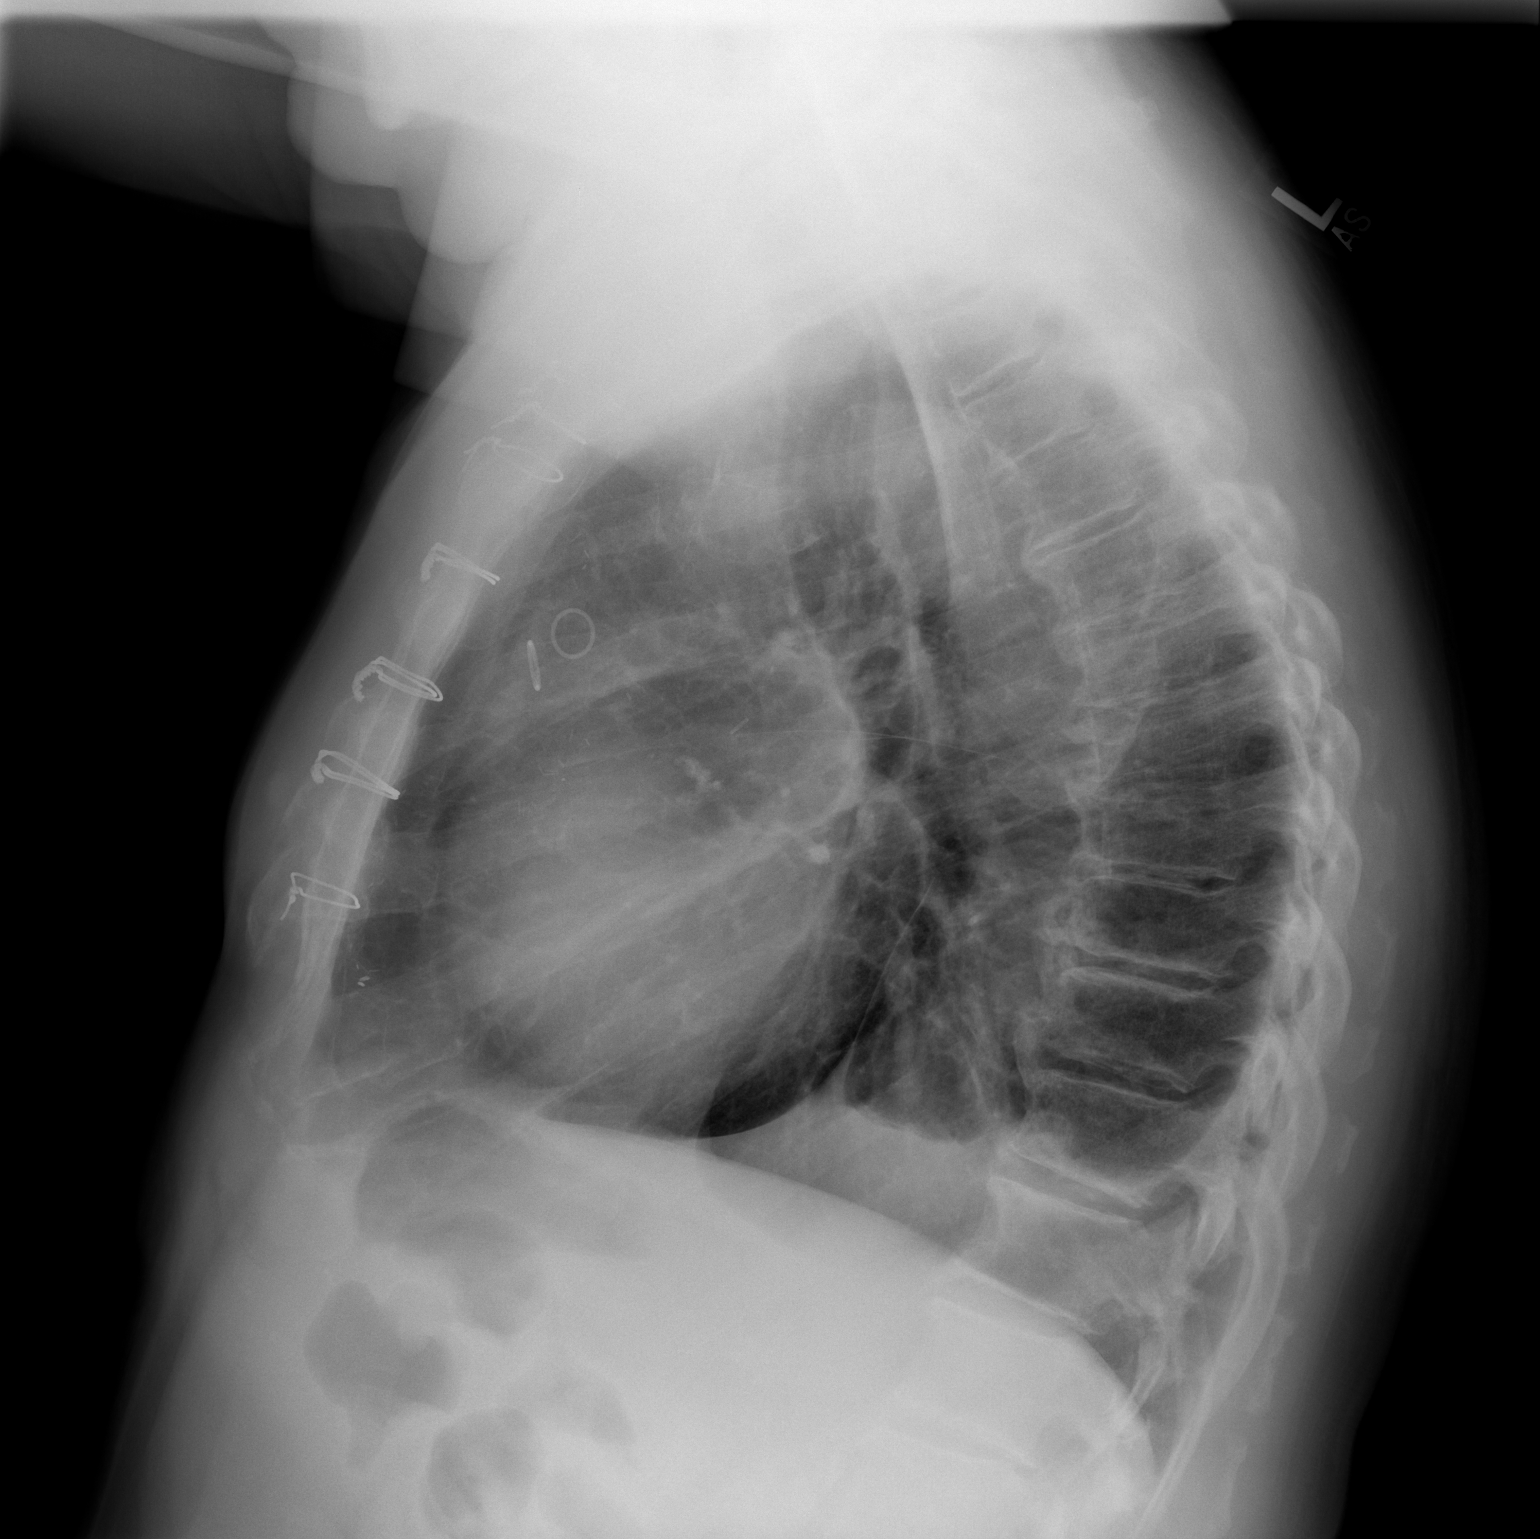

[2 of 2 positions shown; findings below may reference images not displayed]

FINDINGS: The cardiac silhouette, mediastinal and hilar contours
are within normal limits and stable.  No edema or pneumothorax.
There is a stable small left effusion.
IMPRESSION: Stable small left pleural effusion.  No edema or infiltrates.

## 2014-04-07 ENCOUNTER — Other Ambulatory Visit: Payer: Self-pay | Admitting: Cardiology

## 2014-07-30 ENCOUNTER — Other Ambulatory Visit: Payer: Self-pay | Admitting: Cardiology

## 2014-07-31 NOTE — Telephone Encounter (Signed)
Rx was sent to pharmacy electronically. 

## 2014-08-04 ENCOUNTER — Ambulatory Visit (INDEPENDENT_AMBULATORY_CARE_PROVIDER_SITE_OTHER): Payer: Medicare Other | Admitting: Cardiology

## 2014-08-04 ENCOUNTER — Encounter: Payer: Self-pay | Admitting: Cardiology

## 2014-08-04 VITALS — BP 132/78 | HR 68 | Ht 70.0 in | Wt 228.0 lb

## 2014-08-04 DIAGNOSIS — I6523 Occlusion and stenosis of bilateral carotid arteries: Secondary | ICD-10-CM

## 2014-08-04 DIAGNOSIS — I25810 Atherosclerosis of coronary artery bypass graft(s) without angina pectoris: Secondary | ICD-10-CM

## 2014-08-04 NOTE — Progress Notes (Signed)
HPI The patient presents for followup after CABG in June of last year.  Since I last saw him he has done well The patient denies any new symptoms such as chest discomfort, neck or arm discomfort. There has been no new shortness of breath, PND or orthopnea. There have been no reported palpitations, presyncope or syncope. 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  Allergies  Allergen Reactions  . Aspirin     Causes gout flare  . Cortisone Nausea And Vomiting    Current Outpatient Prescriptions  Medication Sig Dispense Refill  . benazepril (LOTENSIN) 10 MG tablet TAKE TWO TABLETS BY MOUTH AT BEDTIME DAILY 60 tablet 0  . glipiZIDE (GLUCOTROL) 5 MG tablet Take 5 mg by mouth at bedtime.     . metoprolol tartrate (LOPRESSOR) 25 MG tablet Take 25 mg by mouth 2 (two) times daily.    . naproxen (NAPROSYN) 250 MG tablet Take by mouth as needed.     No current facility-administered medications for this visit.    Past Medical History  Diagnosis Date  . Diabetes mellitus Age 89    Type 2  . Hypertension   . Hyperlipidemia   . Arthritis   . Joint pain   . Leg pain   . Gout   . PVD (peripheral vascular disease)     SFA 50%, stenosis right profunda  . Coronary artery disease   . Anginal pain     Past Surgical History  Procedure Laterality Date  . Back surgery  2010    lumbar  . Cervical spine surgery    . Coronary artery bypass graft N/A 02/09/2013    Procedure: CORONARY ARTERY BYPASS GRAFTING (CABG);  Surgeon: Kerin PernaPeter Van Trigt, MD;  [facility];  Service: Open Heart Surgery;  Laterality: N/A;  LIMA to the LAD, SVG to right coronary artery, sequential SVG to OM and distal circumflex.  . Intraoperative transesophageal echocardiogram N/A 02/09/2013    Procedure: INTRAOPERATIVE TRANSESOPHAGEAL ECHOCARDIOGRAM;  Surgeon: Kerin PernaPeter Van Trigt, MD;  [facility];  Service: Open Heart Surgery;  Laterality: N/A;    ROS:  As stated in the HPI and negative for all other systems.  PHYSICAL  EXAM BP 132/78 mmHg  Pulse 68  Ht 5\' 10"  (1.778 m)  Wt 228 lb (103.42 kg)  BMI 32.71 kg/m2 GENERAL:  Well appearing NECK:  No jugular venous distention, waveform within normal limits, carotid upstroke brisk and symmetric, no bruits, no thyromegaly LUNGS:  Clear to auscultation bilaterally BACK:  No CVA tenderness CHEST:  Healing sternal wound without sternal mobility or drainage HEART:  PMI not displaced or sustained,S1 and S2 within normal limits, no S3, no S4, no clicks, brief right upper sternal border systolic murmur, no diastolic murmurs ABD:  Flat, positive bowel sounds normal in frequency in pitch, no bruits, no rebound, no guarding, no midline pulsatile mass, no hepatomegaly, no splenomegaly EXT:  2 plus pulses throughout, right greater than left lower extremity edema with healing right wrist scars, no cyanosis no clubbing  EKG:   Sinus rhythm, rate 68, axis within normal limits, intervals within normal limits, old anteroseptal infarct, nonspecific diffuse T wave flattening. No change  08/04/2014  ASSESSMENT AND PLAN  CAD/CABG:  The patient has no new sypmtoms.  No further cardiovascular testing is indicated.  We will continue with aggressive risk reduction and meds as listed.  HTN:  His blood pressure at home is well controlled. We will continue the meds as listed.  CAROTID  STENOSIS:  He has 100% chronic occlusion of the left carotid and 60-79% right. He is overdue for followup. Of note he has been intolerant of statin and Plavix and aspirin.  HYPERLIPIDEMIA:   He is intolerant of statin and cannot afford Zetia.  He understands the need for diet control.

## 2014-08-04 NOTE — Patient Instructions (Signed)
The current medical regimen is effective;  continue present plan and medications.  Your physician has requested that you have a carotid duplex. This test is an ultrasound of the carotid arteries in your neck. It looks at blood flow through these arteries that supply the brain with blood. Allow one hour for this exam. There are no restrictions or special instructions.  Follow up in 18 months with Dr. Antoine [place]Hochrein in [place].  You will receive a letter in the mail 2 months before you are due.  Please call us when you receive this letter to schedule your follow up appointment.

## 2014-08-11 ENCOUNTER — Encounter (INDEPENDENT_AMBULATORY_CARE_PROVIDER_SITE_OTHER): Payer: Medicare Other

## 2014-08-11 DIAGNOSIS — I6523 Occlusion and stenosis of bilateral carotid arteries: Secondary | ICD-10-CM

## 2014-11-26 ENCOUNTER — Other Ambulatory Visit: Payer: Self-pay

## 2014-11-26 MED ORDER — METOPROLOL TARTRATE 25 MG PO TABS: 25.0000 mg | ORAL_TABLET | Freq: Two times a day (BID) | ORAL | Status: DC

## 2015-07-27 ENCOUNTER — Other Ambulatory Visit: Payer: Self-pay

## 2015-07-27 MED ORDER — METOPROLOL TARTRATE 25 MG PO TABS: 25.0000 mg | ORAL_TABLET | Freq: Two times a day (BID) | ORAL | Status: DC

## 2015-11-22 ENCOUNTER — Telehealth: Payer: Self-pay | Admitting: Cardiology

## 2015-11-22 DIAGNOSIS — R42 Dizziness and giddiness: Secondary | ICD-10-CM

## 2015-11-22 DIAGNOSIS — I739 Peripheral vascular disease, unspecified: Secondary | ICD-10-CM

## 2015-11-22 DIAGNOSIS — I779 Disorder of arteries and arterioles, unspecified: Secondary | ICD-10-CM

## 2015-11-22 DIAGNOSIS — R51 Headache: Secondary | ICD-10-CM

## 2015-11-22 DIAGNOSIS — R519 Headache, unspecified: Secondary | ICD-10-CM

## 2015-11-22 NOTE — Telephone Encounter (Signed)
Spoke to patient. Symptoms for ~1 month. C/o dizziness when turning his head suddenly.  C/o headaches w/ tightness around back of head on right side. Avoiding sleeping on right side. He is anxious d/t history of RICA stenosis and wanted to make sure no concerns w/ this.  He reports BP spikes at times w/ headaches, but otherwise BP 120-140/70-80. Denies palpitations or other concerns.  Pt aware I will defer to Dr. Antoine PocheHochrein for advice, see if recommendation for return testing.  Pt notes at last visit, f/u in 18 mo suggested which makes him due for visit in June.  [place] sees [place] office but requested soonest available appt.  Set up for May 2nd and [facility].

## 2015-11-22 NOTE — Telephone Encounter (Signed)
I don't see a Doppler since 2015.  If that is the case he should have a repeat of this now.

## 2015-11-22 NOTE — Telephone Encounter (Signed)
New Message  Pt c/o Syncope: STAT if syncope occurred within 30 minutes and pt complains of lightheadedness [place] if episode of passing out, completely, today or in last 24 hours   Pt called states that he has momentary dizzy spells. If he turns around fairly fast he will get dizzy   Comments: pain from behind the ear around the neck across the head and to his right eye!Lynett Grimes. Wakes him up in the middle of the night he has to take a pain pill

## 2015-11-23 NOTE — Telephone Encounter (Signed)
PATIENT IS AWARE SCHEDULE CAROTID DOPPLERS  IN [place] IF ABNORMAL , DR [name] WILL MAKE DECISION TO SEE PATIENT SOONER IF NEEDED.

## 2015-12-27 ENCOUNTER — Ambulatory Visit: Payer: Self-pay | Admitting: Cardiology

## 2016-02-21 NOTE — Progress Notes (Signed)
HPI The patient presents for followup after CABG in June of 2014.  I have not seen him since 2015.  He returns for follow up. He reports that since I last saw him he's been getting headaches. This is on the right side in the back of his head. He actually describes one episode that sounds like cirrhosis not long ago. He denies any cardiovascular symptoms. He worked for 6 hours in his yard yesterday and he wasn't having any chest pressure, neck or arm discomfort. He doesn't have any palpitations, presyncope or syncope. He denies any shortness of breath, PND or orthopnea. He did report that recently he felt like his blood pressure was up and it was 205. His blood pressure had been running high more frequently so he stopped taking naproxen and his blood pressures are coming back down to be in the 130s and 140s systolic.   Allergies  Allergen Reactions  . Aspirin     Causes gout flare  . Cortisone Nausea And Vomiting    Current Outpatient Prescriptions  Medication Sig Dispense Refill  . benazepril (LOTENSIN) 10 MG tablet TAKE TWO TABLETS BY MOUTH AT BEDTIME DAILY 60 tablet 0  . colchicine 0.6 MG tablet Take 0.6 mg by mouth daily as needed (one tablet by moulth daily as needed for gout).     Marland Kitchen. glipiZIDE (GLUCOTROL) 5 MG tablet Take 5 mg by mouth at bedtime.     Marland Kitchen. HYDROcodone-acetaminophen (NORCO/VICODIN) 5-325 MG tablet Take 1 tablet by mouth every 6 (six) hours as needed for moderate pain.    . methocarbamol (ROBAXIN) 750 MG tablet Take 750 mg by mouth 2 (two) times daily as needed for muscle spasms.    . metoprolol tartrate (LOPRESSOR) 25 MG tablet Take 1 tablet (25 mg total) by mouth 2 (two) times daily. 60 tablet 6  . clopidogrel (PLAVIX) 75 MG tablet Take 1 tablet (75 mg total) by mouth daily. 30 tablet 11   No current facility-administered medications for this visit.    Past Medical History  Diagnosis Date  . Diabetes mellitus Age 75    Type 2  . Hypertension   . Hyperlipidemia   .  Arthritis   . Joint pain   . Leg pain   . Gout   . PVD (peripheral vascular disease) (HCC)     SFA 50%, stenosis right profunda  . Coronary artery disease   . Anginal pain [facility](HCC)     Past Surgical History  Procedure Laterality Date  . Back surgery  2010    lumbar  . Cervical spine surgery    . Coronary artery bypass graft N/A 02/09/2013    Procedure: CORONARY ARTERY BYPASS GRAFTING (CABG);  Surgeon: Kerin PernaPeter Van Trigt, MD;  [facility];  Service: Open Heart Surgery;  Laterality: N/A;  LIMA to the LAD, SVG to right coronary artery, sequential SVG to OM and distal circumflex.  . Intraoperative transesophageal echocardiogram N/A 02/09/2013    Procedure: INTRAOPERATIVE TRANSESOPHAGEAL ECHOCARDIOGRAM;  Surgeon: Kerin PernaPeter Van Trigt, MD;  [facility];  Service: Open Heart Surgery;  Laterality: N/A;    ROS:  As stated in the HPI and negative for all other systems.  PHYSICAL EXAM BP 160/80 mmHg  Pulse 68  Ht 5\' 11"  (1.803 m)  Wt 231 lb (104.781 kg)  BMI 32.23 kg/m2 GENERAL:  Well appearing NECK:  No jugular venous distention, waveform within normal limits, carotid upstroke brisk and symmetric,  bruit, no thyromegaly LUNGS:  Clear to auscultation bilaterally BACK:  No CVA tenderness CHEST:  Healing sternal wound without sternal mobility or drainage HEART:  PMI not displaced or sustained,S1 and S2 within normal limits, no S3, no S4, no clicks, brief right upper sternal border systolic murmur, no diastolic murmurs ABD:  Flat, positive bowel sounds normal in frequency in pitch, no bruits, no rebound, no guarding, no midline pulsatile mass, no hepatomegaly, no splenomegaly EXT:  2 plus pulses throughout, right greater than left lower extremity edema with healing right wrist scars, no cyanosis no clubbing  EKG:   Sinus rhythm, rate 66, axis within normal limits, intervals within normal limits, old anteroseptal infarct, nonspecific diffuse T wave flattening. No change  02/22/2016  ASSESSMENT AND  PLAN  CAD/CABG:  The patient has no new sypmtoms.  No further cardiovascular testing is indicated.  We will continue with aggressive risk reduction and meds as listed.  HTN:  His blood pressure at home is well controlled. We will continue the meds as listed.   However, continues to run high he needs to let me know. Because of his stenosis I would like to have some permissive mild hypertension.  CAROTID STENOSIS:  He has 100% chronic occlusion of the left carotid and 60-79% right.  I am concerned about his recent symptoms and we'll set him up to see [facility].  Of note he's been intolerant of aspirin and hasn't wanted to take Plavix as it doesn't make him feel good. However, he agrees to restart Plavix.  HYPERLIPIDEMIA:   He is intolerant of statin and cannot afford Zetia.  He understands the need for diet control.  DM:  He reports his A1C is in the 6s.  I will defer to Ignatius Speckinghruv B Vyas, MD

## 2016-02-22 ENCOUNTER — Encounter: Payer: Self-pay | Admitting: Cardiology

## 2016-02-22 ENCOUNTER — Ambulatory Visit (INDEPENDENT_AMBULATORY_CARE_PROVIDER_SITE_OTHER): Payer: Medicare Other | Admitting: Cardiology

## 2016-02-22 VITALS — BP 160/80 | HR 68 | Ht 71.0 in | Wt 231.0 lb

## 2016-02-22 DIAGNOSIS — I2510 Atherosclerotic heart disease of native coronary artery without angina pectoris: Secondary | ICD-10-CM

## 2016-02-22 DIAGNOSIS — I6523 Occlusion and stenosis of bilateral carotid arteries: Secondary | ICD-10-CM

## 2016-02-22 MED ORDER — CLOPIDOGREL BISULFATE 75 MG PO TABS: 75.0000 mg | ORAL_TABLET | Freq: Every day | ORAL | Status: DC

## 2016-02-22 NOTE — Patient Instructions (Signed)
Medication Instructions:  Please stop ASA.  Start Plavix 75 mg a day. Continue all other medications as listed.  You will be contacted for an appointment with the vascular surgeon.  If you don't hear anything in the next few days please call [phone].  Follow-Up: Follow up in 1 year with Dr. Antoine [place]Hochrein in [place].  You will receive a letter in the mail 2 months before you are due.  Please call us when you receive this letter to schedule your follow up appointment.  If you need a refill on your cardiac medications before your next appointment, please call your pharmacy.  Thank you for choosing [facility]!!

## 2016-02-23 ENCOUNTER — Other Ambulatory Visit: Payer: Self-pay | Admitting: *Deleted

## 2016-02-23 DIAGNOSIS — G453 Amaurosis fugax: Secondary | ICD-10-CM

## 2016-02-23 DIAGNOSIS — I6523 Occlusion and stenosis of bilateral carotid arteries: Secondary | ICD-10-CM

## 2016-02-27 ENCOUNTER — Encounter: Payer: Self-pay | Admitting: Vascular Surgery

## 2016-02-27 NOTE — Addendum Note (Signed)
Addended by: Reesa ChewJONES, Bryonna Sundby G on: 02/27/2016 04:58 PM   Modules accepted: Orders

## 2016-02-29 ENCOUNTER — Ambulatory Visit (INDEPENDENT_AMBULATORY_CARE_PROVIDER_SITE_OTHER): Payer: Medicare Other | Admitting: Vascular Surgery

## 2016-02-29 ENCOUNTER — Ambulatory Visit (HOSPITAL_COMMUNITY)
Admission: RE | Admit: 2016-02-29 | Discharge: 2016-02-29 | Disposition: A | Discharge status: Home / Self Care | Payer: Medicare Other | Source: Ambulatory Visit | Attending: Vascular Surgery | Admitting: Vascular Surgery

## 2016-02-29 ENCOUNTER — Other Ambulatory Visit: Payer: Self-pay | Admitting: *Deleted

## 2016-02-29 ENCOUNTER — Encounter: Payer: Self-pay | Admitting: Vascular Surgery

## 2016-02-29 VITALS — BP 154/77 | HR 58 | Ht 71.0 in | Wt 232.0 lb

## 2016-02-29 DIAGNOSIS — I10 Essential (primary) hypertension: Secondary | ICD-10-CM | POA: Insufficient documentation

## 2016-02-29 DIAGNOSIS — E119 Type 2 diabetes mellitus without complications: Secondary | ICD-10-CM | POA: Diagnosis not present

## 2016-02-29 DIAGNOSIS — G453 Amaurosis fugax: Secondary | ICD-10-CM | POA: Insufficient documentation

## 2016-02-29 DIAGNOSIS — I6523 Occlusion and stenosis of bilateral carotid arteries: Secondary | ICD-10-CM

## 2016-02-29 DIAGNOSIS — E785 Hyperlipidemia, unspecified: Secondary | ICD-10-CM | POA: Insufficient documentation

## 2016-02-29 DIAGNOSIS — I2510 Atherosclerotic heart disease of native coronary artery without angina pectoris: Secondary | ICD-10-CM | POA: Diagnosis not present

## 2016-02-29 LAB — VAS US CAROTID
LCCADDIAS: 15 cm/s
LCCADSYS: 75 cm/s
LEFT ECA DIAS: -17 cm/s
LEFT VERTEBRAL DIAS: 13 cm/s
Left CCA prox dias: 9 cm/s
Left CCA prox sys: 73 cm/s
RCCAPSYS: 96 cm/s
RIGHT CCA MID DIAS: 25 cm/s
RIGHT ECA DIAS: -14 cm/s
Right CCA prox dias: 19 cm/s
Right cca dist sys: -79 cm/s

## 2016-02-29 NOTE — Progress Notes (Signed)
[facility]  Patient name: Tony [name] MRN: [id number] DOB: 08/28/1940 Sex: male  REASON FOR VISIT: Carotid stenosis. Referred by Dr. Antoine PocheHochrein.   HPI: Tony [name] is a 75 y.o. male referred for a carotid evaluation. I reviewed the records that were sent from Dr. Jenene SlickerHochrein's office. The patient underwent CABG in 2014. He had been having some headaches and I believe there was some concern that he may have had an episode of amaurosis fugax. This reason he was sent for vascular consultation.  This patient had previously been seen by Dr. Hart RochesterLawson on 02/06/2013. At that time the patient had an occluded left internal carotid artery with a 40-59% right internal carotid artery stenosis.   The patient is left-handed. He denies any history of stroke, TIAs, expressive or receptive aphasia. Does describe 1 episode when he got up in the middle the night and noticed that he could not see the lower half of his visual field on the left eye. This has been persistent. He denies any symptoms in the right eye.  Past Medical History  Diagnosis Date  . Diabetes mellitus Age 75    Type 2  . Hypertension   . Hyperlipidemia   . Arthritis   . Joint pain   . Leg pain   . Gout   . PVD (peripheral vascular disease) (HCC)     SFA 50%, stenosis right profunda  . Coronary artery disease   . Anginal pain (HCC)     Family History  Problem Relation Age of Onset  . Heart failure Mother 8080  . Cancer Sister     SOCIAL HISTORY: Social History  Substance Use Topics  . Smoking status: Former Smoker -- 3.00 packs/day for 40 years    Types: Cigarettes    Quit date: 05/27/1992  . Smokeless tobacco: Never Used  . Alcohol Use: No    Allergies  Allergen Reactions  . Aspirin     Causes gout flare  . Cortisone Nausea And Vomiting    Current Outpatient Prescriptions  Medication Sig Dispense Refill  . benazepril (LOTENSIN) 10 MG tablet TAKE TWO TABLETS BY MOUTH AT BEDTIME DAILY  60 tablet 0  . clopidogrel (PLAVIX) 75 MG tablet Take 1 tablet (75 mg total) by mouth daily. 30 tablet 11  . colchicine 0.6 MG tablet Take 0.6 mg by mouth daily as needed (one tablet by moulth daily as needed for gout).     Marland Kitchen. glipiZIDE (GLUCOTROL) 5 MG tablet Take 5 mg by mouth at bedtime.     Marland Kitchen. HYDROcodone-acetaminophen (NORCO/VICODIN) 5-325 MG tablet Take 1 tablet by mouth every 6 (six) hours as needed for moderate pain.    . methocarbamol (ROBAXIN) 750 MG tablet Take 750 mg by mouth 2 (two) times daily as needed for muscle spasms.    . metoprolol tartrate (LOPRESSOR) 25 MG tablet Take 1 tablet (25 mg total) by mouth 2 (two) times daily. 60 tablet 6   No current facility-administered medications for this visit.    REVIEW OF SYSTEMS:  [X]  denotes positive finding, [ ]  denotes negative finding Cardiac  Comments:  Chest pain or chest pressure:    Shortness of breath upon exertion:    Short of breath when lying flat:    Irregular heart rhythm:        Vascular    Pain in calf, thigh, or hip brought on by ambulation:    Pain in feet at night that wakes you up from your  sleep:     Blood clot in your veins:    Leg swelling:         Pulmonary    Oxygen at home:    Productive cough:     Wheezing:         Neurologic    Sudden weakness in arms or legs:     Sudden numbness in arms or legs:     Sudden onset of difficulty speaking or slurred speech:    Temporary loss of vision in one eye:  X   Problems with dizziness:         Gastrointestinal    Blood in stool:     Vomited blood:         Genitourinary    Burning when urinating:     Blood in urine:        Psychiatric    Major depression:         Hematologic    Bleeding problems:    Problems with blood clotting too easily:        Skin    Rashes or ulcers:        Constitutional    Fever or chills:      PHYSICAL EXAM: Filed Vitals:   02/29/16 1024  BP: 155/86  Pulse: 58  Height: 5\' 11"  (1.803 m)  Weight: 232 lb  (105.235 kg)  SpO2: 95%    GENERAL: The patient is a well-nourished male, in no acute distress. The vital signs are documented above. CARDIAC: There is a regular rate and rhythm.  VASCULAR: I do not detect carotid bruits. He has palpable posterior tibial pulses bilaterally. PULMONARY: There is good air exchange bilaterally without wheezing or rales. ABDOMEN: Soft and non-tender with normal pitched bowel sounds.  MUSCULOSKELETAL: There are no major deformities or cyanosis. NEUROLOGIC: No focal weakness or paresthesias are detected. SKIN: There are no ulcers or rashes noted. PSYCHIATRIC: The patient has a normal affect.  DATA:   CAROTID DUPLEX: I have independently interpreted his carotid duplex scan today.  On the right side there is a 40-59% right internal carotid artery stenosis. The stenosis is in the lower end of that range.  On the left side the internal carotid artery is occluded.  Both vertebral arteries are patent with antegrade flow.  MEDICAL ISSUES:  BILATERAL CAROTID DISEASE: His left internal carotid artery is occluded and has been occluded for at least 3 years. He has only a mild 40-59% (lower end of that range) stenosis on the right. He has been told to begin Plavix and I have encouraged him to do that. He does not tolerate aspirin and does not tolerate statins. Given that the left carotid is occluded no further vascular workup is needed for the left carotid. He has only a mild right carotid stenosis which is asymptomatic. I would recommend yearly carotid duplex scans and these are already arranged at Dr. Jenene SlickerHochrein's office. He is scheduled to see Dr. Ashley RoyaltyMatthews concerning his left eye symptoms also. I'll be happy to see him at any time if any new vascular issues arise.  HYPERTENSION: The patient's initial blood pressure today was elevated. We repeated this and this was still elevated. We have encouraged the patient to follow up with their primary care physician for management  of their blood pressure.    Waverly Ferrariickson, [place] [facility] [place] [phone]

## 2016-03-01 ENCOUNTER — Encounter (INDEPENDENT_AMBULATORY_CARE_PROVIDER_SITE_OTHER): Payer: Medicare Other | Admitting: Ophthalmology

## 2016-03-01 DIAGNOSIS — H43813 Vitreous degeneration, bilateral: Secondary | ICD-10-CM

## 2016-03-01 DIAGNOSIS — I10 Essential (primary) hypertension: Secondary | ICD-10-CM

## 2016-03-01 DIAGNOSIS — H35033 Hypertensive retinopathy, bilateral: Secondary | ICD-10-CM | POA: Diagnosis not present

## 2016-03-01 DIAGNOSIS — H2513 Age-related nuclear cataract, bilateral: Secondary | ICD-10-CM | POA: Diagnosis not present

## 2016-03-01 DIAGNOSIS — E113291 Type 2 diabetes mellitus with mild nonproliferative diabetic retinopathy without macular edema, right eye: Secondary | ICD-10-CM | POA: Diagnosis not present

## 2016-03-01 DIAGNOSIS — E11319 Type 2 diabetes mellitus with unspecified diabetic retinopathy without macular edema: Secondary | ICD-10-CM

## 2016-03-01 DIAGNOSIS — H34232 Retinal artery branch occlusion, left eye: Secondary | ICD-10-CM

## 2016-04-02 ENCOUNTER — Encounter (INDEPENDENT_AMBULATORY_CARE_PROVIDER_SITE_OTHER): Payer: Medicare Other | Admitting: Ophthalmology

## 2016-04-02 DIAGNOSIS — I10 Essential (primary) hypertension: Secondary | ICD-10-CM

## 2016-04-02 DIAGNOSIS — H34232 Retinal artery branch occlusion, left eye: Secondary | ICD-10-CM | POA: Diagnosis not present

## 2016-04-02 DIAGNOSIS — E113293 Type 2 diabetes mellitus with mild nonproliferative diabetic retinopathy without macular edema, bilateral: Secondary | ICD-10-CM | POA: Diagnosis not present

## 2016-04-02 DIAGNOSIS — E11319 Type 2 diabetes mellitus with unspecified diabetic retinopathy without macular edema: Secondary | ICD-10-CM | POA: Diagnosis not present

## 2016-04-02 DIAGNOSIS — H35033 Hypertensive retinopathy, bilateral: Secondary | ICD-10-CM | POA: Diagnosis not present

## 2016-04-02 DIAGNOSIS — H43813 Vitreous degeneration, bilateral: Secondary | ICD-10-CM | POA: Diagnosis not present

## 2016-08-28 DIAGNOSIS — S82831D Other fracture of upper and lower end of right fibula, subsequent encounter for closed fracture with routine healing: Secondary | ICD-10-CM | POA: Diagnosis not present

## 2016-09-11 DIAGNOSIS — S82831D Other fracture of upper and lower end of right fibula, subsequent encounter for closed fracture with routine healing: Secondary | ICD-10-CM | POA: Diagnosis not present

## 2016-09-25 DIAGNOSIS — S82831D Other fracture of upper and lower end of right fibula, subsequent encounter for closed fracture with routine healing: Secondary | ICD-10-CM | POA: Diagnosis not present

## 2016-09-28 DIAGNOSIS — H02413 Mechanical ptosis of bilateral eyelids: Secondary | ICD-10-CM | POA: Diagnosis not present

## 2016-10-05 DIAGNOSIS — Z299 Encounter for prophylactic measures, unspecified: Secondary | ICD-10-CM | POA: Diagnosis not present

## 2016-10-05 DIAGNOSIS — Z713 Dietary counseling and surveillance: Secondary | ICD-10-CM | POA: Diagnosis not present

## 2016-10-05 DIAGNOSIS — I10 Essential (primary) hypertension: Secondary | ICD-10-CM | POA: Diagnosis not present

## 2016-10-05 DIAGNOSIS — R600 Localized edema: Secondary | ICD-10-CM | POA: Diagnosis not present

## 2016-10-05 DIAGNOSIS — Z6833 Body mass index (BMI) 33.0-33.9, adult: Secondary | ICD-10-CM | POA: Diagnosis not present

## 2016-10-05 DIAGNOSIS — E1165 Type 2 diabetes mellitus with hyperglycemia: Secondary | ICD-10-CM | POA: Diagnosis not present

## 2016-11-19 DIAGNOSIS — R69 Illness, unspecified: Secondary | ICD-10-CM | POA: Diagnosis not present

## 2016-11-21 DIAGNOSIS — H02834 Dermatochalasis of left upper eyelid: Secondary | ICD-10-CM | POA: Diagnosis not present

## 2016-11-21 DIAGNOSIS — H02413 Mechanical ptosis of bilateral eyelids: Secondary | ICD-10-CM | POA: Diagnosis not present

## 2016-11-21 DIAGNOSIS — H02831 Dermatochalasis of right upper eyelid: Secondary | ICD-10-CM | POA: Diagnosis not present

## 2016-11-27 DIAGNOSIS — R200 Anesthesia of skin: Secondary | ICD-10-CM | POA: Diagnosis not present

## 2016-11-27 DIAGNOSIS — Z6833 Body mass index (BMI) 33.0-33.9, adult: Secondary | ICD-10-CM | POA: Diagnosis not present

## 2016-11-27 DIAGNOSIS — M25511 Pain in right shoulder: Secondary | ICD-10-CM | POA: Diagnosis not present

## 2016-11-27 DIAGNOSIS — Z299 Encounter for prophylactic measures, unspecified: Secondary | ICD-10-CM | POA: Diagnosis not present

## 2016-11-27 DIAGNOSIS — M109 Gout, unspecified: Secondary | ICD-10-CM | POA: Diagnosis not present

## 2016-11-27 DIAGNOSIS — E1165 Type 2 diabetes mellitus with hyperglycemia: Secondary | ICD-10-CM | POA: Diagnosis not present

## 2016-11-27 DIAGNOSIS — I10 Essential (primary) hypertension: Secondary | ICD-10-CM | POA: Diagnosis not present

## 2017-01-03 DIAGNOSIS — Z0000 Encounter for general adult medical examination without abnormal findings: Secondary | ICD-10-CM | POA: Diagnosis not present

## 2017-01-03 DIAGNOSIS — E1165 Type 2 diabetes mellitus with hyperglycemia: Secondary | ICD-10-CM | POA: Diagnosis not present

## 2017-01-03 DIAGNOSIS — I10 Essential (primary) hypertension: Secondary | ICD-10-CM | POA: Diagnosis not present

## 2017-01-03 DIAGNOSIS — Z299 Encounter for prophylactic measures, unspecified: Secondary | ICD-10-CM | POA: Diagnosis not present

## 2017-01-03 DIAGNOSIS — E7800 Pure hypercholesterolemia, unspecified: Secondary | ICD-10-CM | POA: Diagnosis not present

## 2017-01-03 DIAGNOSIS — Z79899 Other long term (current) drug therapy: Secondary | ICD-10-CM | POA: Diagnosis not present

## 2017-01-03 DIAGNOSIS — R5383 Other fatigue: Secondary | ICD-10-CM | POA: Diagnosis not present

## 2017-01-03 DIAGNOSIS — Z7189 Other specified counseling: Secondary | ICD-10-CM | POA: Diagnosis not present

## 2017-01-03 DIAGNOSIS — Z1211 Encounter for screening for malignant neoplasm of colon: Secondary | ICD-10-CM | POA: Diagnosis not present

## 2017-01-03 DIAGNOSIS — Z6832 Body mass index (BMI) 32.0-32.9, adult: Secondary | ICD-10-CM | POA: Diagnosis not present

## 2017-01-03 DIAGNOSIS — Z125 Encounter for screening for malignant neoplasm of prostate: Secondary | ICD-10-CM | POA: Diagnosis not present

## 2017-01-03 DIAGNOSIS — Z1389 Encounter for screening for other disorder: Secondary | ICD-10-CM | POA: Diagnosis not present

## 2017-01-04 DIAGNOSIS — E113211 Type 2 diabetes mellitus with mild nonproliferative diabetic retinopathy with macular edema, right eye: Secondary | ICD-10-CM | POA: Diagnosis not present

## 2017-02-03 NOTE — Progress Notes (Signed)
HPI The patient presents for followup after CABG in June of 2014.  He returns for follow up.  Cardiovascular standpoint he says he's done well. He remains very active. He denies any cardiovascular symptoms. The patient denies any new symptoms such as chest discomfort, neck or arm discomfort. There has been no new shortness of breath, PND or orthopnea. There have been no reported palpitations, presyncope or syncope.  He had problems with a broken ankle. He needs dilated surgery. He had some unexplained itching.  He does have decreased vision and has been followed by ophthalmology.   Allergies  Allergen Reactions  . Aspirin     Causes gout flare  . Cortisone Nausea And Vomiting    Current Outpatient Prescriptions  Medication Sig Dispense Refill  . benazepril (LOTENSIN) 10 MG tablet Take 10 mg by mouth 2 (two) times daily.    . clopidogrel (PLAVIX) 75 MG tablet Take 1 tablet (75 mg total) by mouth daily. 30 tablet 11  . colchicine 0.6 MG tablet Take 0.6 mg by mouth daily as needed (one tablet by moulth daily as needed for gout).     Marland Kitchen glipiZIDE (GLUCOTROL) 5 MG tablet Take 5 mg by mouth at bedtime.     Marland Kitchen HYDROcodone-acetaminophen (NORCO/VICODIN) 5-325 MG tablet Take 1 tablet by mouth every 6 (six) hours as needed for moderate pain.    . methocarbamol (ROBAXIN) 750 MG tablet Take 750 mg by mouth 2 (two) times daily as needed for muscle spasms.    . metoprolol tartrate (LOPRESSOR) 25 MG tablet Take 1 tablet (25 mg total) by mouth 2 (two) times daily. 60 tablet 6   No current facility-administered medications for this visit.     Past Medical History:  Diagnosis Date  . Anginal pain (HCC)   . Arthritis   . Coronary artery disease   . Diabetes mellitus Age 76   Type 2  . Gout   . Hyperlipidemia   . Hypertension   . Joint pain   . Leg pain   . PVD (peripheral vascular disease) (HCC)    SFA 50%, stenosis right profunda    Past Surgical History:  Procedure Laterality Date  . BACK  SURGERY  2010   lumbar  . CERVICAL SPINE SURGERY    . CORONARY ARTERY BYPASS GRAFT N/A 02/09/2013   Procedure: CORONARY ARTERY BYPASS GRAFTING (CABG);  Surgeon: Kerin Perna, MD;  Location: [facility] OR;  Service: Open Heart Surgery;  Laterality: N/A;  LIMA to the LAD, SVG to right coronary artery, sequential SVG to OM and distal circumflex.  . INTRAOPERATIVE TRANSESOPHAGEAL ECHOCARDIOGRAM N/A 02/09/2013   Procedure: INTRAOPERATIVE TRANSESOPHAGEAL ECHOCARDIOGRAM;  Surgeon: Kerin Perna, MD;  Location: [facility] OR;  Service: Open Heart Surgery;  Laterality: N/A;    ROS:  As stated in the HPI and negative for all other systems.  PHYSICAL EXAM BP 140/80   Pulse 64   Ht 5' 10.5" (1.791 m)   Wt 233 lb (105.7 kg)   BMI 32.96 kg/m   GENERAL:  Well appearing NECK:  No jugular venous distention, waveform within normal limits, carotid upstroke brisk and symmetric, no bruits, no thyromegaly LUNGS:  Clear to auscultation bilaterally CHEST:  Well healed sternotomy scar. HEART:  PMI not displaced or sustained,S1 and S2 within normal limits, no S3, no S4, no clicks, no rubs, 2 out of 6 apical systolic murmur radiating slightly out the aortic outflow tract and early peaking and unchanged from previous, no diastolic murmurs ABD:  Flat,  positive bowel sounds normal in frequency in pitch, no bruits, no rebound, no guarding, no midline pulsatile mass, no hepatomegaly, no splenomegaly EXT:  2 plus pulses throughout, trace edema, no cyanosis no clubbing   EKG:   Sinus rhythm, rate 64 axis within normal limits, intervals within normal limits, old anteroseptal infarct, nonspecific diffuse T wave flattening. No change  02/06/2017  ASSESSMENT AND PLAN  CAD/CABG:  The patient has no new sypmtoms.  No further cardiovascular testing is indicated.  We will continue with aggressive risk reduction and meds as listed.    HTN:   BP at home is controlled.  He will continue with meds as listed.   CAROTID STENOSIS:  He has 100%  chronic occlusion of the left carotid and 60-79% right.  At the last visit he had headaches and I wanted him to be seen by [facility].  He saw Dr. Edilia Boickson and no further work up was indicated.  He did restart Plavix although he only takes it a few times weekly.  He did see an ophthalmologist. He has decreased vision and reports a "busted vein."   HYPERLIPIDEMIA:   He is intolerant of statin and cannot afford Zetia.   [place].  He will continue with diet.   DM:  He reports that the A1C was 6.5.

## 2017-02-06 ENCOUNTER — Ambulatory Visit (INDEPENDENT_AMBULATORY_CARE_PROVIDER_SITE_OTHER): Payer: Medicare HMO | Admitting: Cardiology

## 2017-02-06 ENCOUNTER — Encounter: Payer: Self-pay | Admitting: Cardiology

## 2017-02-06 VITALS — BP 140/80 | HR 64 | Ht 70.5 in | Wt 233.0 lb

## 2017-02-06 DIAGNOSIS — E118 Type 2 diabetes mellitus with unspecified complications: Secondary | ICD-10-CM | POA: Diagnosis not present

## 2017-02-06 DIAGNOSIS — I10 Essential (primary) hypertension: Secondary | ICD-10-CM

## 2017-02-06 DIAGNOSIS — I779 Disorder of arteries and arterioles, unspecified: Secondary | ICD-10-CM | POA: Diagnosis not present

## 2017-02-06 DIAGNOSIS — I2510 Atherosclerotic heart disease of native coronary artery without angina pectoris: Secondary | ICD-10-CM | POA: Diagnosis not present

## 2017-02-06 DIAGNOSIS — I739 Peripheral vascular disease, unspecified: Secondary | ICD-10-CM

## 2017-02-06 DIAGNOSIS — E785 Hyperlipidemia, unspecified: Secondary | ICD-10-CM | POA: Diagnosis not present

## 2017-02-06 NOTE — Patient Instructions (Signed)

## 2017-03-05 DIAGNOSIS — M545 Low back pain: Secondary | ICD-10-CM | POA: Diagnosis not present

## 2017-03-05 DIAGNOSIS — I2510 Atherosclerotic heart disease of native coronary artery without angina pectoris: Secondary | ICD-10-CM | POA: Diagnosis not present

## 2017-03-05 DIAGNOSIS — E1165 Type 2 diabetes mellitus with hyperglycemia: Secondary | ICD-10-CM | POA: Diagnosis not present

## 2017-03-05 DIAGNOSIS — E7800 Pure hypercholesterolemia, unspecified: Secondary | ICD-10-CM | POA: Diagnosis not present

## 2017-03-05 DIAGNOSIS — I10 Essential (primary) hypertension: Secondary | ICD-10-CM | POA: Diagnosis not present

## 2017-03-05 DIAGNOSIS — Z299 Encounter for prophylactic measures, unspecified: Secondary | ICD-10-CM | POA: Diagnosis not present

## 2017-03-05 DIAGNOSIS — Z6833 Body mass index (BMI) 33.0-33.9, adult: Secondary | ICD-10-CM | POA: Diagnosis not present

## 2017-04-19 DIAGNOSIS — I209 Angina pectoris, unspecified: Secondary | ICD-10-CM | POA: Diagnosis not present

## 2017-04-19 DIAGNOSIS — E1165 Type 2 diabetes mellitus with hyperglycemia: Secondary | ICD-10-CM | POA: Diagnosis not present

## 2017-04-19 DIAGNOSIS — Z6832 Body mass index (BMI) 32.0-32.9, adult: Secondary | ICD-10-CM | POA: Diagnosis not present

## 2017-04-19 DIAGNOSIS — R350 Frequency of micturition: Secondary | ICD-10-CM | POA: Diagnosis not present

## 2017-04-19 DIAGNOSIS — M545 Low back pain: Secondary | ICD-10-CM | POA: Diagnosis not present

## 2017-04-19 DIAGNOSIS — I10 Essential (primary) hypertension: Secondary | ICD-10-CM | POA: Diagnosis not present

## 2017-04-19 DIAGNOSIS — Z299 Encounter for prophylactic measures, unspecified: Secondary | ICD-10-CM | POA: Diagnosis not present

## 2017-04-19 DIAGNOSIS — I2510 Atherosclerotic heart disease of native coronary artery without angina pectoris: Secondary | ICD-10-CM | POA: Diagnosis not present

## 2017-04-19 DIAGNOSIS — E7800 Pure hypercholesterolemia, unspecified: Secondary | ICD-10-CM | POA: Diagnosis not present

## 2017-05-07 DIAGNOSIS — Z6832 Body mass index (BMI) 32.0-32.9, adult: Secondary | ICD-10-CM | POA: Diagnosis not present

## 2017-05-07 DIAGNOSIS — I2510 Atherosclerotic heart disease of native coronary artery without angina pectoris: Secondary | ICD-10-CM | POA: Diagnosis not present

## 2017-05-07 DIAGNOSIS — Z299 Encounter for prophylactic measures, unspecified: Secondary | ICD-10-CM | POA: Diagnosis not present

## 2017-05-07 DIAGNOSIS — I10 Essential (primary) hypertension: Secondary | ICD-10-CM | POA: Diagnosis not present

## 2017-05-07 DIAGNOSIS — I209 Angina pectoris, unspecified: Secondary | ICD-10-CM | POA: Diagnosis not present

## 2017-05-07 DIAGNOSIS — E1165 Type 2 diabetes mellitus with hyperglycemia: Secondary | ICD-10-CM | POA: Diagnosis not present

## 2017-05-07 DIAGNOSIS — E7800 Pure hypercholesterolemia, unspecified: Secondary | ICD-10-CM | POA: Diagnosis not present

## 2017-05-07 DIAGNOSIS — M545 Low back pain: Secondary | ICD-10-CM | POA: Diagnosis not present

## 2017-05-07 DIAGNOSIS — M542 Cervicalgia: Secondary | ICD-10-CM | POA: Diagnosis not present

## 2017-06-05 DIAGNOSIS — E1165 Type 2 diabetes mellitus with hyperglycemia: Secondary | ICD-10-CM | POA: Diagnosis not present

## 2017-06-05 DIAGNOSIS — M542 Cervicalgia: Secondary | ICD-10-CM | POA: Diagnosis not present

## 2017-06-05 DIAGNOSIS — N433 Hydrocele, unspecified: Secondary | ICD-10-CM | POA: Diagnosis not present

## 2017-06-05 DIAGNOSIS — Z6832 Body mass index (BMI) 32.0-32.9, adult: Secondary | ICD-10-CM | POA: Diagnosis not present

## 2017-06-05 DIAGNOSIS — Z299 Encounter for prophylactic measures, unspecified: Secondary | ICD-10-CM | POA: Diagnosis not present

## 2017-06-18 DIAGNOSIS — M19071 Primary osteoarthritis, right ankle and foot: Secondary | ICD-10-CM | POA: Diagnosis not present

## 2017-06-26 DIAGNOSIS — Z713 Dietary counseling and surveillance: Secondary | ICD-10-CM | POA: Diagnosis not present

## 2017-06-26 DIAGNOSIS — E1165 Type 2 diabetes mellitus with hyperglycemia: Secondary | ICD-10-CM | POA: Diagnosis not present

## 2017-06-26 DIAGNOSIS — Z299 Encounter for prophylactic measures, unspecified: Secondary | ICD-10-CM | POA: Diagnosis not present

## 2017-06-26 DIAGNOSIS — Z6832 Body mass index (BMI) 32.0-32.9, adult: Secondary | ICD-10-CM | POA: Diagnosis not present

## 2017-08-30 DIAGNOSIS — I10 Essential (primary) hypertension: Secondary | ICD-10-CM | POA: Diagnosis not present

## 2017-08-30 DIAGNOSIS — Z6832 Body mass index (BMI) 32.0-32.9, adult: Secondary | ICD-10-CM | POA: Diagnosis not present

## 2017-08-30 DIAGNOSIS — M25511 Pain in right shoulder: Secondary | ICD-10-CM | POA: Diagnosis not present

## 2017-08-30 DIAGNOSIS — E7800 Pure hypercholesterolemia, unspecified: Secondary | ICD-10-CM | POA: Diagnosis not present

## 2017-08-30 DIAGNOSIS — E1165 Type 2 diabetes mellitus with hyperglycemia: Secondary | ICD-10-CM | POA: Diagnosis not present

## 2017-08-30 DIAGNOSIS — Z299 Encounter for prophylactic measures, unspecified: Secondary | ICD-10-CM | POA: Diagnosis not present

## 2017-09-10 DIAGNOSIS — R69 Illness, unspecified: Secondary | ICD-10-CM | POA: Diagnosis not present

## 2017-09-30 DIAGNOSIS — Z713 Dietary counseling and surveillance: Secondary | ICD-10-CM | POA: Diagnosis not present

## 2017-09-30 DIAGNOSIS — Z6833 Body mass index (BMI) 33.0-33.9, adult: Secondary | ICD-10-CM | POA: Diagnosis not present

## 2017-09-30 DIAGNOSIS — M25511 Pain in right shoulder: Secondary | ICD-10-CM | POA: Diagnosis not present

## 2017-09-30 DIAGNOSIS — I10 Essential (primary) hypertension: Secondary | ICD-10-CM | POA: Diagnosis not present

## 2017-09-30 DIAGNOSIS — Z299 Encounter for prophylactic measures, unspecified: Secondary | ICD-10-CM | POA: Diagnosis not present

## 2017-10-01 DIAGNOSIS — S46911D Strain of unspecified muscle, fascia and tendon at shoulder and upper arm level, right arm, subsequent encounter: Secondary | ICD-10-CM | POA: Diagnosis not present

## 2017-10-02 DIAGNOSIS — I10 Essential (primary) hypertension: Secondary | ICD-10-CM | POA: Diagnosis not present

## 2017-10-02 DIAGNOSIS — I2510 Atherosclerotic heart disease of native coronary artery without angina pectoris: Secondary | ICD-10-CM | POA: Diagnosis not present

## 2017-10-02 DIAGNOSIS — E7800 Pure hypercholesterolemia, unspecified: Secondary | ICD-10-CM | POA: Diagnosis not present

## 2017-10-02 DIAGNOSIS — Z299 Encounter for prophylactic measures, unspecified: Secondary | ICD-10-CM | POA: Diagnosis not present

## 2017-10-02 DIAGNOSIS — E1165 Type 2 diabetes mellitus with hyperglycemia: Secondary | ICD-10-CM | POA: Diagnosis not present

## 2017-10-09 DIAGNOSIS — Z6832 Body mass index (BMI) 32.0-32.9, adult: Secondary | ICD-10-CM | POA: Diagnosis not present

## 2017-10-09 DIAGNOSIS — Z713 Dietary counseling and surveillance: Secondary | ICD-10-CM | POA: Diagnosis not present

## 2017-10-09 DIAGNOSIS — E1165 Type 2 diabetes mellitus with hyperglycemia: Secondary | ICD-10-CM | POA: Diagnosis not present

## 2017-10-09 DIAGNOSIS — Z2821 Immunization not carried out because of patient refusal: Secondary | ICD-10-CM | POA: Diagnosis not present

## 2017-10-09 DIAGNOSIS — Z299 Encounter for prophylactic measures, unspecified: Secondary | ICD-10-CM | POA: Diagnosis not present

## 2017-10-22 DIAGNOSIS — S46911D Strain of unspecified muscle, fascia and tendon at shoulder and upper arm level, right arm, subsequent encounter: Secondary | ICD-10-CM | POA: Diagnosis not present

## 2017-10-22 DIAGNOSIS — M75101 Unspecified rotator cuff tear or rupture of right shoulder, not specified as traumatic: Secondary | ICD-10-CM | POA: Diagnosis not present

## 2017-10-30 DIAGNOSIS — Z299 Encounter for prophylactic measures, unspecified: Secondary | ICD-10-CM | POA: Diagnosis not present

## 2017-10-30 DIAGNOSIS — M7591 Shoulder lesion, unspecified, right shoulder: Secondary | ICD-10-CM | POA: Diagnosis not present

## 2017-10-30 DIAGNOSIS — E1165 Type 2 diabetes mellitus with hyperglycemia: Secondary | ICD-10-CM | POA: Diagnosis not present

## 2017-10-30 DIAGNOSIS — I10 Essential (primary) hypertension: Secondary | ICD-10-CM | POA: Diagnosis not present

## 2017-10-30 DIAGNOSIS — M25511 Pain in right shoulder: Secondary | ICD-10-CM | POA: Diagnosis not present

## 2017-10-30 DIAGNOSIS — M7551 Bursitis of right shoulder: Secondary | ICD-10-CM | POA: Diagnosis not present

## 2017-10-30 DIAGNOSIS — M79604 Pain in right leg: Secondary | ICD-10-CM | POA: Diagnosis not present

## 2017-10-30 DIAGNOSIS — Z6832 Body mass index (BMI) 32.0-32.9, adult: Secondary | ICD-10-CM | POA: Diagnosis not present

## 2017-10-30 DIAGNOSIS — M75121 Complete rotator cuff tear or rupture of right shoulder, not specified as traumatic: Secondary | ICD-10-CM | POA: Diagnosis not present

## 2017-11-05 DIAGNOSIS — M75101 Unspecified rotator cuff tear or rupture of right shoulder, not specified as traumatic: Secondary | ICD-10-CM | POA: Diagnosis not present

## 2017-11-05 DIAGNOSIS — S46911D Strain of unspecified muscle, fascia and tendon at shoulder and upper arm level, right arm, subsequent encounter: Secondary | ICD-10-CM | POA: Diagnosis not present

## 2017-11-15 DIAGNOSIS — Z713 Dietary counseling and surveillance: Secondary | ICD-10-CM | POA: Diagnosis not present

## 2017-11-15 DIAGNOSIS — Z299 Encounter for prophylactic measures, unspecified: Secondary | ICD-10-CM | POA: Diagnosis not present

## 2017-11-15 DIAGNOSIS — M25571 Pain in right ankle and joints of right foot: Secondary | ICD-10-CM | POA: Diagnosis not present

## 2017-11-15 DIAGNOSIS — Z6833 Body mass index (BMI) 33.0-33.9, adult: Secondary | ICD-10-CM | POA: Diagnosis not present

## 2017-11-15 DIAGNOSIS — E1165 Type 2 diabetes mellitus with hyperglycemia: Secondary | ICD-10-CM | POA: Diagnosis not present

## 2017-11-19 DIAGNOSIS — M19071 Primary osteoarthritis, right ankle and foot: Secondary | ICD-10-CM | POA: Diagnosis not present

## 2017-11-19 DIAGNOSIS — M84371D Stress fracture, right ankle, subsequent encounter for fracture with routine healing: Secondary | ICD-10-CM | POA: Diagnosis not present

## 2017-11-22 DIAGNOSIS — M62571 Muscle wasting and atrophy, not elsewhere classified, right ankle and foot: Secondary | ICD-10-CM | POA: Diagnosis not present

## 2017-11-22 DIAGNOSIS — R600 Localized edema: Secondary | ICD-10-CM | POA: Diagnosis not present

## 2017-11-22 DIAGNOSIS — M25571 Pain in right ankle and joints of right foot: Secondary | ICD-10-CM | POA: Diagnosis not present

## 2018-01-07 DIAGNOSIS — I10 Essential (primary) hypertension: Secondary | ICD-10-CM | POA: Diagnosis not present

## 2018-01-07 DIAGNOSIS — Z1211 Encounter for screening for malignant neoplasm of colon: Secondary | ICD-10-CM | POA: Diagnosis not present

## 2018-01-07 DIAGNOSIS — Z79899 Other long term (current) drug therapy: Secondary | ICD-10-CM | POA: Diagnosis not present

## 2018-01-07 DIAGNOSIS — Z1331 Encounter for screening for depression: Secondary | ICD-10-CM | POA: Diagnosis not present

## 2018-01-07 DIAGNOSIS — E7800 Pure hypercholesterolemia, unspecified: Secondary | ICD-10-CM | POA: Diagnosis not present

## 2018-01-07 DIAGNOSIS — M25511 Pain in right shoulder: Secondary | ICD-10-CM | POA: Diagnosis not present

## 2018-01-07 DIAGNOSIS — Z1339 Encounter for screening examination for other mental health and behavioral disorders: Secondary | ICD-10-CM | POA: Diagnosis not present

## 2018-01-07 DIAGNOSIS — Z125 Encounter for screening for malignant neoplasm of prostate: Secondary | ICD-10-CM | POA: Diagnosis not present

## 2018-01-07 DIAGNOSIS — Z7189 Other specified counseling: Secondary | ICD-10-CM | POA: Diagnosis not present

## 2018-01-07 DIAGNOSIS — R5383 Other fatigue: Secondary | ICD-10-CM | POA: Diagnosis not present

## 2018-01-07 DIAGNOSIS — Z0000 Encounter for general adult medical examination without abnormal findings: Secondary | ICD-10-CM | POA: Diagnosis not present

## 2018-01-14 DIAGNOSIS — Z6831 Body mass index (BMI) 31.0-31.9, adult: Secondary | ICD-10-CM | POA: Diagnosis not present

## 2018-01-14 DIAGNOSIS — M75101 Unspecified rotator cuff tear or rupture of right shoulder, not specified as traumatic: Secondary | ICD-10-CM | POA: Diagnosis not present

## 2018-01-14 DIAGNOSIS — E1165 Type 2 diabetes mellitus with hyperglycemia: Secondary | ICD-10-CM | POA: Diagnosis not present

## 2018-01-14 DIAGNOSIS — S46911D Strain of unspecified muscle, fascia and tendon at shoulder and upper arm level, right arm, subsequent encounter: Secondary | ICD-10-CM | POA: Diagnosis not present

## 2018-01-14 DIAGNOSIS — I10 Essential (primary) hypertension: Secondary | ICD-10-CM | POA: Diagnosis not present

## 2018-01-14 DIAGNOSIS — Z299 Encounter for prophylactic measures, unspecified: Secondary | ICD-10-CM | POA: Diagnosis not present

## 2018-01-14 DIAGNOSIS — Z713 Dietary counseling and surveillance: Secondary | ICD-10-CM | POA: Diagnosis not present

## 2018-04-02 ENCOUNTER — Ambulatory Visit: Payer: Medicare HMO | Admitting: Cardiology

## 2018-04-02 DIAGNOSIS — M25511 Pain in right shoulder: Secondary | ICD-10-CM | POA: Diagnosis not present

## 2018-04-02 DIAGNOSIS — I10 Essential (primary) hypertension: Secondary | ICD-10-CM | POA: Diagnosis not present

## 2018-04-02 DIAGNOSIS — Z299 Encounter for prophylactic measures, unspecified: Secondary | ICD-10-CM | POA: Diagnosis not present

## 2018-04-02 DIAGNOSIS — E1165 Type 2 diabetes mellitus with hyperglycemia: Secondary | ICD-10-CM | POA: Diagnosis not present

## 2018-04-02 DIAGNOSIS — Z6834 Body mass index (BMI) 34.0-34.9, adult: Secondary | ICD-10-CM | POA: Diagnosis not present

## 2018-04-03 NOTE — Progress Notes (Signed)
HPI The patient presents for followup after CABG in June of 2014.  He returns for follow up.  He is raising his "kitchen garden".  It has been a very good year.  The patient denies any new symptoms such as chest discomfort, neck or arm discomfort. There has been no new shortness of breath, PND or orthopnea. There have been no reported palpitations, presyncope or syncope.     Allergies  Allergen Reactions  . Aspirin     Causes gout flare  . Cortisone Nausea And Vomiting    Current Outpatient Medications  Medication Sig Dispense Refill  . benazepril (LOTENSIN) 10 MG tablet Take 10 mg by mouth 2 (two) times daily.    Marland Kitchen glipiZIDE (GLUCOTROL) 5 MG tablet Take 5 mg by mouth at bedtime.     Marland Kitchen HYDROcodone-acetaminophen (NORCO/VICODIN) 5-325 MG tablet Take 1 tablet by mouth every 6 (six) hours as needed for moderate pain.    . methocarbamol (ROBAXIN) 750 MG tablet Take 750 mg by mouth 2 (two) times daily as needed for muscle spasms.    . metoprolol tartrate (LOPRESSOR) 25 MG tablet Take 1 tablet (25 mg total) by mouth 2 (two) times daily. 60 tablet 6   No current facility-administered medications for this visit.     Past Medical History:  Diagnosis Date  . Anginal pain (HCC)   . Arthritis   . Coronary artery disease   . Diabetes mellitus Age 77   Type 2  . Gout   . Hyperlipidemia   . Hypertension   . Joint pain   . Leg pain   . PVD (peripheral vascular disease) (HCC)    SFA 50%, stenosis right profunda    Past Surgical History:  Procedure Laterality Date  . BACK SURGERY  2010   lumbar  . CERVICAL SPINE SURGERY    . CORONARY ARTERY BYPASS GRAFT N/A 02/09/2013   Procedure: CORONARY ARTERY BYPASS GRAFTING (CABG);  Surgeon: Kerin Perna, MD;  Location: [facility] OR;  Service: Open Heart Surgery;  Laterality: N/A;  LIMA to the LAD, SVG to right coronary artery, sequential SVG to OM and distal circumflex.  . INTRAOPERATIVE TRANSESOPHAGEAL ECHOCARDIOGRAM N/A 02/09/2013   Procedure:  INTRAOPERATIVE TRANSESOPHAGEAL ECHOCARDIOGRAM;  Surgeon: Kerin Perna, MD;  Location: [facility] OR;  Service: Open Heart Surgery;  Laterality: N/A;    ROS:   As stated in the HPI and negative for all other systems.  PHYSICAL EXAM BP 140/88   Pulse 64   Ht 5\' 11"  (1.803 m)   Wt 235 lb (106.6 kg)   BMI 32.78 kg/m   GENERAL:  Well appearing NECK:  No jugular venous distention, waveform within normal limits, carotid upstroke brisk and symmetric, no bruits, no thyromegaly LUNGS:  Clear to auscultation bilaterally CHEST:  Well healed sternotomy scar. HEART:  PMI not displaced or sustained,S1 and S2 within normal limits, no S3, no S4, no clicks, no rubs, 2 out of 6 apical systolic murmur radiating slightly at the aortic outflow tract, no diastolic murmurs ABD:  Flat, positive bowel sounds normal in frequency in pitch, positive bruits, no rebound, no guarding, no midline pulsatile mass, no hepatomegaly, no splenomegaly EXT:  2 plus pulses throughout, no edema, no cyanosis no clubbing, bilateral femoral bruit   EKG:   Sinus rhythm, rate 64  axis within normal limits, intervals within normal limits, old anteroseptal infarct, nonspecific diffuse T wave flattening. No change  04/04/2018  ASSESSMENT AND PLAN  CAD/CABG:  The patient has no  new sypmtoms.  No further cardiovascular testing is indicated.  We will continue with aggressive risk reduction and meds as listed.  HTN:   BP is controlled.  Continue current therapy.   CAROTID STENOSIS:  He has 100% chronic occlusion of the left carotid and 60-79% right.   He is overdue for Doppler and I will arrange this.   HYPERLIPIDEMIA:       He is intolerant of statin and cannot afford Zetia.  I would like for him to be on PCSK9 and will have him follow up in our Lipid Clinic  DM:    A1C was 6.5.  He will continue the meds as listed.

## 2018-04-04 ENCOUNTER — Ambulatory Visit: Payer: Medicare HMO | Admitting: Cardiology

## 2018-04-04 ENCOUNTER — Encounter: Payer: Self-pay | Admitting: Cardiology

## 2018-04-04 VITALS — BP 140/88 | HR 64 | Ht 71.0 in | Wt 235.0 lb

## 2018-04-04 DIAGNOSIS — I2510 Atherosclerotic heart disease of native coronary artery without angina pectoris: Secondary | ICD-10-CM

## 2018-04-04 DIAGNOSIS — E785 Hyperlipidemia, unspecified: Secondary | ICD-10-CM | POA: Diagnosis not present

## 2018-04-04 DIAGNOSIS — I6523 Occlusion and stenosis of bilateral carotid arteries: Secondary | ICD-10-CM

## 2018-04-04 DIAGNOSIS — I10 Essential (primary) hypertension: Secondary | ICD-10-CM

## 2018-04-04 NOTE — Patient Instructions (Signed)
Medication Instructions:  The current medical regimen is effective;  continue present plan and medications.  You are being referred to the Lipid Clinic.  Your physician has requested that you have a carotid duplex. This test is an ultrasound of the carotid arteries in your neck. It looks at blood flow through these arteries that supply the brain with blood. Allow one hour for this exam. There are no restrictions or special instructions.  Follow-Up: Follow up in 1 year with Dr. Antoine PocheHochrein.  You will receive a letter in the mail 2 months before you are due.  Please call us when you receive this letter to schedule your follow up appointment.  If you need a refill on your cardiac medications before your next appointment, please call your pharmacy.  Thank you for choosing [facility]!!

## 2018-04-15 DIAGNOSIS — S46911D Strain of unspecified muscle, fascia and tendon at shoulder and upper arm level, right arm, subsequent encounter: Secondary | ICD-10-CM | POA: Diagnosis not present

## 2018-04-15 DIAGNOSIS — M75101 Unspecified rotator cuff tear or rupture of right shoulder, not specified as traumatic: Secondary | ICD-10-CM | POA: Diagnosis not present

## 2018-04-16 ENCOUNTER — Encounter (HOSPITAL_COMMUNITY): Payer: Medicare HMO

## 2018-04-16 NOTE — Progress Notes (Signed)
Patient ID: Tony LintBruce A Knabe                 DOB: 1940/11/23                    MRN: [id number]     HPI: Tony [name] is a 77 y.o. male patient referred to lipid clinic by Dr Antoine PocheHochrein.PMH is significant for CAD s/p CABG, PAD, carotid stenosis, angina, DM-II, hypertension, goat, and hyperlipidemia.  Noted statin intolerance after trial with atorvastatin, rosuvastatin, and simvastatin. Patient will like to tolerate rosuvastatin because his wife take it and does well with it, but developed sever muscle pain and nausea while on statins. He presents to clinic today for potential PCSK9i initiation.  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 Anginal pain (HCC)   . Arthritis   . Coronary artery disease   . Diabetes mellitus Age 77   Type 2  . Gout   . Hyperlipidemia   . Hypertension   . Joint pain   . Leg pain   . PVD (peripheral vascular disease) (HCC)    SFA 50%, stenosis right profunda    Current Outpatient Medications on File Prior to Visit  Medication Sig Dispense Refill  . benazepril (LOTENSIN) 10 MG tablet Take 10 mg by mouth 2 (two) times daily.    Marland Kitchen. glipiZIDE (GLUCOTROL) 5 MG tablet Take 5 mg by mouth at bedtime.     Marland Kitchen. HYDROcodone-acetaminophen (NORCO/VICODIN) 5-325 MG tablet Take 1 tablet by mouth every 6 (six) hours as needed for moderate pain.    . methocarbamol (ROBAXIN) 750 MG tablet Take 750 mg by mouth 2 (two) times daily as needed for muscle spasms.    .  metoprolol tartrate (LOPRESSOR) 25 MG tablet Take 1 tablet (25 mg total) by mouth 2 (two) times daily. 60 tablet 6   No current facility-administered medications on file prior to visit.     Allergies  Allergen Reactions  . Aspirin     Causes gout flare  . Cortisone Nausea And Vomiting    Dyslipidemia LDL and TG extremely elevated for secondary prevention. Patient failed previous trial with high intensity and moderate intensity statins due to severe ADR. He continues to work on positive lifestyle modifications and is a good candidate for PCSK9i therapy. We discussed Repatha/Praluent indication, MOA, commons side effects, storage, administration, and prior-authorization process.   Will request recent lipid panel from PCP (done ~ 2-3 weeks ago per patient history) and apply for Repatha/Praluent prior authorization. Plan to repeat fasting lipid panel after 4-5 dose of new medication given.    Freman Lapage Rodriguez-Guzman PharmD, BCPS, CPP [facility] [address] [place] [phone] 04/18/2018 7:54 AM

## 2018-04-17 ENCOUNTER — Ambulatory Visit (INDEPENDENT_AMBULATORY_CARE_PROVIDER_SITE_OTHER): Payer: Medicare HMO | Admitting: Pharmacist

## 2018-04-17 ENCOUNTER — Ambulatory Visit (HOSPITAL_COMMUNITY)
Admission: RE | Admit: 2018-04-17 | Discharge: 2018-04-17 | Disposition: A | Discharge status: Home / Self Care | Payer: Medicare HMO | Source: Ambulatory Visit | Attending: Cardiology | Admitting: Cardiology

## 2018-04-17 DIAGNOSIS — E785 Hyperlipidemia, unspecified: Secondary | ICD-10-CM

## 2018-04-17 DIAGNOSIS — I6523 Occlusion and stenosis of bilateral carotid arteries: Secondary | ICD-10-CM | POA: Diagnosis not present

## 2018-04-17 NOTE — Patient Instructions (Signed)
Lipid Clinic (pharmacy) Kristin/Dorismar Chay [phone]  *START paperwork for Repatha/Praluent* *Repeat blood work after 2 months on new medication*  Cholesterol Cholesterol is a fat. Your body needs a small amount of cholesterol. Cholesterol (plaque) may build up in your blood vessels (arteries). That makes you more likely to have a heart attack or stroke. You cannot feel your cholesterol level. Having a blood test is the only way to find out if your level is high. Keep your test results. Work with your doctor to keep your cholesterol at a good level. What do the results mean?  Total cholesterol is how much cholesterol is in your blood.  LDL is bad cholesterol. This is the type that can build up. Try to have low LDL.  HDL is good cholesterol. It cleans your blood vessels and carries LDL away. Try to have high HDL.  Triglycerides are fat that the body can store or burn for energy. What are good levels of cholesterol?  Total cholesterol below 200.  LDL below 100 is good for people who have health risks. LDL below 70 is good for people who have very high risks.  HDL above 40 is good. It is best to have HDL of 60 or higher.  Triglycerides below 150. How can I lower my cholesterol? Diet Follow your diet program as told by your doctor.  Choose fish, white meat chicken, or [place]key that is roasted or baked. Try not to eat red meat, fried foods, sausage, or lunch meats.  Eat lots of fresh fruits and vegetables.  Choose whole grains, beans, pasta, potatoes, and cereals.  Choose olive oil, corn oil, or canola oil. Only use small amounts.  Try not to eat butter, mayonnaise, shortening, or palm kernel oils.  Try not to eat foods with trans fats.  Choose low-fat or nonfat dairy foods. ? Drink skim or nonfat milk. ? Eat low-fat or nonfat yogurt and cheeses. ? Try not to drink whole milk or cream. ? Try not to eat ice cream, egg yolks, or full-fat cheeses.  Healthy desserts include  angel food cake, ginger snaps, animal crackers, hard candy, popsicles, and low-fat or nonfat frozen yogurt. Try not to eat pastries, cakes, pies, and cookies.  Exercise Follow your exercise program as told by your doctor.  Be more active. Try gardening, walking, and taking the stairs.  Ask your doctor about ways that you can be more active.  Medicine  Take over-the-counter and prescription medicines only as told by your doctor. This information is not intended to replace advice given to you by your health care provider. Make sure you discuss any questions you have with your health care provider. Document Released: 11/09/2008 Document Revised: 03/14/2016 Document Reviewed: 02/23/2016 Elsevier Interactive Patient Education  [employer]2018 Elsevier Inc.

## 2018-04-18 ENCOUNTER — Encounter: Payer: Self-pay | Admitting: Pharmacist

## 2018-04-18 NOTE — Assessment & Plan Note (Signed)
LDL and TG extremely elevated for secondary prevention. Patient failed previous trial with high intensity and moderate intensity statins due to severe ADR. He continues to work on positive lifestyle modifications and is a good candidate for PCSK9i therapy. We discussed Repatha/Praluent indication, MOA, commons side effects, storage, administration, and prior-authorization process.   Will request recent lipid panel from PCP (done ~ 2-3 weeks ago per patient history) and apply for Repatha/Praluent prior authorization. Plan to repeat fasting lipid panel after 4-5 dose of new medication given.

## 2018-04-21 ENCOUNTER — Telehealth: Payer: Self-pay | Admitting: Pharmacist

## 2018-04-21 NOTE — Telephone Encounter (Signed)
[facility] use [facility]

## 2018-04-22 DIAGNOSIS — R69 Illness, unspecified: Secondary | ICD-10-CM | POA: Diagnosis not present

## 2018-04-22 DIAGNOSIS — Z713 Dietary counseling and surveillance: Secondary | ICD-10-CM | POA: Diagnosis not present

## 2018-04-22 DIAGNOSIS — Z6832 Body mass index (BMI) 32.0-32.9, adult: Secondary | ICD-10-CM | POA: Diagnosis not present

## 2018-04-22 DIAGNOSIS — E1165 Type 2 diabetes mellitus with hyperglycemia: Secondary | ICD-10-CM | POA: Diagnosis not present

## 2018-04-22 DIAGNOSIS — Z299 Encounter for prophylactic measures, unspecified: Secondary | ICD-10-CM | POA: Diagnosis not present

## 2018-04-25 ENCOUNTER — Telehealth: Payer: Self-pay | Admitting: Pharmacist

## 2018-04-25 MED ORDER — EVOLOCUMAB 140 MG/ML ~~LOC~~ SOAJ: 140.0000 mg | SUBCUTANEOUS | 11 refills | Status: DC

## 2018-04-25 NOTE — Telephone Encounter (Signed)
PA approved by insurance. Rx sent to prefer pharmacy

## 2018-04-30 DIAGNOSIS — M62511 Muscle wasting and atrophy, not elsewhere classified, right shoulder: Secondary | ICD-10-CM | POA: Diagnosis not present

## 2018-04-30 DIAGNOSIS — M11011 Hydroxyapatite deposition disease, right shoulder: Secondary | ICD-10-CM | POA: Diagnosis not present

## 2018-04-30 DIAGNOSIS — M75121 Complete rotator cuff tear or rupture of right shoulder, not specified as traumatic: Secondary | ICD-10-CM | POA: Diagnosis not present

## 2018-05-06 ENCOUNTER — Telehealth: Payer: Self-pay

## 2018-05-06 DIAGNOSIS — M75101 Unspecified rotator cuff tear or rupture of right shoulder, not specified as traumatic: Secondary | ICD-10-CM | POA: Diagnosis not present

## 2018-05-06 NOTE — Telephone Encounter (Signed)
   [facility] Pre-operative Risk Assessment    Request for surgical clearance:  1. What type of surgery is being performed? Right Total Shoulder Replacement   2. When is this surgery scheduled? 06/25/18   3. What type of clearance is required (medical clearance vs. Pharmacy clearance to hold med vs. Both)? Medical  4. Are there any medications that need to be held prior to surgery and how long? n/a   5. Practice name and name of physician performing surgery? [facility]    6. What is your office phone number [phone]    7.   What is your office fax number [phone]  8.   Anesthesia type (None, local, MAC, general) ? Unknown   Ena Dawley 05/06/2018, 3:42 PM  _________________________________________________________________   (provider comments below)

## 2018-05-07 NOTE — Telephone Encounter (Signed)
   Primary Cardiologist: Dr Antoine Poche  Chart reviewed as part of pre-operative protocol coverage. Given past medical history and time since last visit, based on ACC/AHA guidelines, Malik A Wilemon would be at acceptable risk for the planned procedure without further cardiovascular testing.   I will route this recommendation to the requesting party via Epic fax function and remove from pre-op pool.  Please call with questions.  Corine Shelter, PA-C 05/07/2018, 3:44 PM

## 2018-05-13 DIAGNOSIS — M25511 Pain in right shoulder: Secondary | ICD-10-CM | POA: Diagnosis not present

## 2018-05-13 DIAGNOSIS — I10 Essential (primary) hypertension: Secondary | ICD-10-CM | POA: Diagnosis not present

## 2018-05-13 DIAGNOSIS — Z6832 Body mass index (BMI) 32.0-32.9, adult: Secondary | ICD-10-CM | POA: Diagnosis not present

## 2018-05-13 DIAGNOSIS — E1165 Type 2 diabetes mellitus with hyperglycemia: Secondary | ICD-10-CM | POA: Diagnosis not present

## 2018-05-13 DIAGNOSIS — Z299 Encounter for prophylactic measures, unspecified: Secondary | ICD-10-CM | POA: Diagnosis not present

## 2018-06-06 DIAGNOSIS — R69 Illness, unspecified: Secondary | ICD-10-CM | POA: Diagnosis not present

## 2018-06-13 NOTE — Pre-Procedure Instructions (Signed)
Tony [name]  06/13/2018      [id number] [place],  - [address] [place] [address] Phone: [phone] Fax: [phone]    Your procedure is scheduled on Wednesday October 30.  Report to [facility] Admitting at 5:30 A.M.  Call this number if you have problems the morning of surgery:  [phone]   Remember:  Do not eat or drink after midnight.    Take these medicines the morning of surgery with A SIP OF WATER: NONE  DO NOT TAKE glipizide (Glucotrol) the day of surgery.   THE day before surgery, take only morning and lunch doses of Glipizide (Glucotrol). Do not take the night before surgery.      How to Manage Your Diabetes Before and After Surgery  Why is it important to control my blood sugar before and after surgery? . Improving blood sugar levels before and after surgery helps healing and can limit problems. . A way of improving blood sugar control is eating a healthy diet by: o  Eating less sugar and carbohydrates o  Increasing activity/exercise o  Talking with your doctor about reaching your blood sugar goals . High blood sugars (greater than 180 mg/dL) can raise your risk of infections and slow your recovery, so you will need to focus on controlling your diabetes during the weeks before surgery. . Make sure that the doctor who takes care of your diabetes knows about your planned surgery including the date and location.  How do I manage my blood sugar before surgery? . Check your blood sugar at least 4 times a day, starting 2 days before surgery, to make sure that the level is not too high or low. o Check your blood sugar the morning of your surgery when you wake up and every 2 hours until you get to the Short Stay unit. . If your blood sugar is less than 70 mg/dL, you will need to treat for low blood sugar: o Do not take insulin. o Treat a low blood sugar (less than 70 mg/dL) with  cup of clear  juice (cranberry or apple), 4 glucose tablets, OR glucose gel. Recheck blood sugar in 15 minutes after treatment (to make sure it is greater than 70 mg/dL). If your blood sugar is not greater than 70 mg/dL on recheck, call [phone] o  for further instructions. . Report your blood sugar to the short stay nurse when you get to Short Stay.  . If you are admitted to the hospital after surgery: o Your blood sugar will be checked by the staff and you will probably be given insulin after surgery (instead of oral diabetes medicines) to make sure you have good blood sugar levels. o The goal for blood sugar control after surgery is 80-180 mg/dL.                Do not wear jewelry  Do not wear lotions, powders, or colognes, or deodorant.  Do not shave 48 hours prior to surgery.  Men may shave face and neck.  Do not bring valuables to the hospital.  [facility] is not responsible for any belongings or valuables.  Contacts, dentures or bridgework may not be worn into surgery.  Leave your suitcase in the car.  After surgery it may be brought to your room.  For patients admitted to the hospital, discharge time will be determined by your treatment team.  Patients discharged the day of surgery  will not be allowed to drive home.   Special instructions:    [facility]- Preparing For Surgery  Before surgery, you can play an important role. Because skin is not sterile, your skin needs to be as free of germs as possible. You can reduce the number of germs on your skin by washing with CHG (chlorahexidine gluconate) Soap before surgery.  CHG is an antiseptic cleaner which kills germs and bonds with the skin to continue killing germs even after washing.    Oral Hygiene is also important to reduce your risk of infection.  Remember - BRUSH YOUR TEETH THE MORNING OF SURGERY WITH YOUR REGULAR TOOTHPASTE  Please do not use if you have an allergy to CHG or antibacterial soaps. If your skin becomes  reddened/irritated stop using the CHG.  Do not shave (including legs and underarms) for at least 48 hours prior to first CHG shower. It is OK to shave your face.  Please follow these instructions carefully.   1. Shower the NIGHT BEFORE SURGERY and the MORNING OF SURGERY with CHG.   2. If you chose to wash your hair, wash your hair first as usual with your normal shampoo.  3. After you shampoo, rinse your hair and body thoroughly to remove the shampoo.  4. Use CHG as you would any other liquid soap. You can apply CHG directly to the skin and wash gently with a scrungie or a clean washcloth.   5. Apply the CHG Soap to your body ONLY FROM THE NECK DOWN.  Do not use on open wounds or open sores. Avoid contact with your eyes, ears, mouth and genitals (private parts). Wash Face and genitals (private parts)  with your normal soap.  6. Wash thoroughly, paying special attention to the area where your surgery will be performed.  7. Thoroughly rinse your body with warm water from the neck down.  8. DO NOT shower/wash with your normal soap after using and rinsing off the CHG Soap.  9. Pat yourself dry with a CLEAN TOWEL.  10. Wear CLEAN PAJAMAS to bed the night before surgery, wear comfortable clothes the morning of surgery  11. Place CLEAN SHEETS on your bed the night of your first shower and DO NOT SLEEP WITH PETS.    Day of Surgery:  Do not apply any deodorants/lotions.  Please wear clean clothes to the hospital/surgery center.   Remember to brush your teeth WITH YOUR REGULAR TOOTHPASTE.    Please read over the following fact sheets that you were given. Coughing and Deep Breathing, MRSA Information and Surgical Site Infection Prevention

## 2018-06-16 ENCOUNTER — Encounter (HOSPITAL_COMMUNITY): Payer: Self-pay

## 2018-06-16 ENCOUNTER — Encounter (HOSPITAL_COMMUNITY)
Admission: RE | Admit: 2018-06-16 | Discharge: 2018-06-16 | Disposition: A | Discharge status: Home / Self Care | Payer: Medicare HMO | Source: Ambulatory Visit | Attending: Orthopaedic Surgery | Admitting: Orthopaedic Surgery

## 2018-06-16 ENCOUNTER — Other Ambulatory Visit: Payer: Self-pay

## 2018-06-16 DIAGNOSIS — R69 Illness, unspecified: Secondary | ICD-10-CM | POA: Diagnosis not present

## 2018-06-16 DIAGNOSIS — Z01812 Encounter for preprocedural laboratory examination: Secondary | ICD-10-CM | POA: Diagnosis not present

## 2018-06-16 HISTORY — DX: Peripheral vascular disease, unspecified: I73.9

## 2018-06-16 LAB — CBC
HCT: 41.8 % (ref 39.0–52.0)
HEMOGLOBIN: 13.7 g/dL (ref 13.0–17.0)
MCH: 29.5 pg (ref 26.0–34.0)
MCHC: 32.8 g/dL (ref 30.0–36.0)
MCV: 90.1 fL (ref 80.0–100.0)
Platelets: 183 10*3/uL (ref 150–400)
RBC: 4.64 MIL/uL (ref 4.22–5.81)
RDW: 13.4 % (ref 11.5–15.5)
WBC: 7.2 10*3/uL (ref 4.0–10.5)
nRBC: 0 % (ref 0.0–0.2)

## 2018-06-16 LAB — BASIC METABOLIC PANEL
ANION GAP: 12 (ref 5–15)
BUN: 15 mg/dL (ref 8–23)
CHLORIDE: 103 mmol/L (ref 98–111)
CO2: 23 mmol/L (ref 22–32)
Calcium: 9.1 mg/dL (ref 8.9–10.3)
Creatinine, Ser: 1.04 mg/dL (ref 0.61–1.24)
GFR calc Af Amer: >60 mL/min (ref >60)
GFR calc non Af Amer: >60 mL/min (ref >60)
GLUCOSE: 176 mg/dL — AB (ref 70–99)
POTASSIUM: 4 mmol/L (ref 3.5–5.1)
Sodium: 138 mmol/L (ref 135–145)

## 2018-06-16 LAB — SURGICAL PCR SCREEN
MRSA, PCR: NEGATIVE
Staphylococcus aureus: NEGATIVE

## 2018-06-16 LAB — GLUCOSE, CAPILLARY: Glucose-Capillary: 129 mg/dL — ABNORMAL HIGH (ref 70–99)

## 2018-06-16 NOTE — Progress Notes (Signed)
   06/16/18 1113  OBSTRUCTIVE SLEEP APNEA  Have you ever been diagnosed with sleep apnea through a sleep study? No  Do you snore loudly (loud enough to be heard through closed doors)?  0  Do you often feel tired, fatigued, or sleepy during the daytime (such as falling asleep during driving or talking to someone)? 0  Has anyone observed you stop breathing during your sleep? 1  Do you have, or are you being treated for high blood pressure? 1  BMI more than 35 kg/m2? 0  Age > 50 (1-yes) 1  Neck circumference greater than:Male 16 inches or larger, Male 17inches or larger? 1  Male Gender (Yes=1) 1  Obstructive Sleep Apnea Score 5  Score 5 or greater  Results sent to PCP

## 2018-06-16 NOTE — Progress Notes (Signed)
PCP - Dr. Sherril Croon Cardiologist -  Dr. Antoine Poche  EKG - 04/04/18 ECHO - 2014 Cardiac Cath - 2014  Fasting Blood Sugar - 115-120s Checks Blood Sugar 1-2 times a day  Blood Thinner Instructions:N/A  Aspirin Instructions: N/A  Anesthesia review: Cardiac hx  Patient denies shortness of breath, fever, cough and chest pain at PAT appointment   Patient verbalized understanding of instructions that were given to them at the PAT appointment. Patient was also instructed that they will need to review over the PAT instructions again at home before surgery.

## 2018-06-17 ENCOUNTER — Encounter (HOSPITAL_COMMUNITY): Payer: Self-pay

## 2018-06-17 DIAGNOSIS — R69 Illness, unspecified: Secondary | ICD-10-CM | POA: Diagnosis not present

## 2018-06-17 LAB — HEMOGLOBIN A1C
Hgb A1c MFr Bld: 6.7 % — ABNORMAL HIGH (ref 4.8–5.6)
Mean Plasma Glucose: 146 mg/dL

## 2018-06-17 NOTE — Progress Notes (Signed)
Anesthesia Chart Review:  Case:  [id number] Date/Time:  06/25/18 0715   Procedure:  RIGHT REVERSE SHOULDER ARTHROPLASTY (Right Shoulder)   Anesthesia type:  Choice   Pre-op diagnosis:  djd right shoulder   Location:  [facility]OM 06 / [facility]   Surgeon:  Bjorn Pippin, MD      DISCUSSION: Patient is a 77 year old male scheduled for the above procedure.   History includes former smoker (quit '93), CAD (s/p CABG: LIMA-LAD, SVG-RCA, SVG-OM1-dCX 02/09/13), DM2, HTN, HLD, PVD (> 75% right SFA stenosis 11/2011; ABI right AT/PT 0.76/0.82, left AT/PT 0.88/0.96 02/06/13), left ICA occlusion. OSA screening score is 5.   On 05/07/18, per Corine Shelter, PA-C with [facility], "Given past medical history and time since last visit [04/04/18], based on ACC/AHA guidelines, Tony [name] would be at acceptable risk for the planned procedure without further cardiovascular testing."  If no acute changes then I anticipate that he can proceed as planned.   VS: BP (!) 141/66   Pulse 80   Temp 36.8 C   Resp 20   Ht 5\' 11"  (1.803 m)   Wt 103.9 kg   SpO2 99%   BMI 31.95 kg/m   PROVIDERS: Ignatius Specking, MD is PCP [name], MD is cardiologist. Last visit 04/04/18.  [name], MD is vascular surgeon. Last visit 02/29/16. He felt Dr. Antoine Poche could continue to monitor carotid artery disease and refer back to vascular surgery when/if needed.   LABS: Labs reviewed: Acceptable for surgery. (all labs ordered are listed, but only abnormal results are displayed)  Labs Reviewed  GLUCOSE, CAPILLARY - Abnormal; Notable for the following components:      Result Value   Glucose-Capillary 129 (*)    All other components within normal limits  BASIC METABOLIC PANEL - Abnormal; Notable for the following components:   Glucose, Bld 176 (*)    All other components within normal limits  HEMOGLOBIN A1C - Abnormal; Notable for the following components:   Hgb A1c MFr Bld 6.7 (*)    All other components within  normal limits  SURGICAL PCR SCREEN  CBC     EKG: 04/04/18: NSR with sinus arrhythmia. Anteroseptal infarct, age undetermined. T wave abnormality, consider lateral ischemia. No significant changes since prior tracing.    CV: Carotid U/S 04/17/18: Final Interpretation: Right Carotid: Velocities in the right ICA are consistent with a 1-39% stenosis        (high-end of range). Non-hemodynamically significant plaque <50%        noted in the CCA. Left Carotid: Velocities in the left ICA are consistent with a total occlusion.       Non-hemodynamically significant plaque noted in the CCA. Vertebrals: Bilateral vertebral arteries demonstrate antegrade flow. Subclavians: Normal flow hemodynamics were seen in bilateral subclavian       arteries.  Echo 03/02/13: Study Conclusions - Left ventricle: The cavity size was normal. Wall thickness was increased in a pattern of moderate LVH. Systolic function was normal. The estimated ejection fraction was in the range of 55% to 65%. Wall motion was normal; there were no regional wall motion abnormalities. Features are consistent with a pseudonormal left ventricular filling pattern, with concomitant abnormal relaxation and increased filling pressure (grade 2 diastolic dysfunction). - Aortic valve: There was very mild stenosis. - Mitral valve: Mild regurgitation. - Left atrium: The atrium was mildly dilated. - Right atrium: The atrium was mildly dilated. - Atrial septum: No defect or patent foramen ovale was identified. -  Pericardium, extracardiac: A trivial pericardial effusion was identified.  Last [facility] 02/05/13 prior to CABG.   Past Medical History:  Diagnosis Date  . Anginal pain (HCC)    2014  . Arthritis   . Carotid artery disease (HCC)    left ICA occlusion  . Coronary artery disease   . Diabetes mellitus Age 62   Type 2  . Gout   . Hyperlipidemia   . Hypertension   . Joint pain    . Leg pain   . PVD (peripheral vascular disease) (HCC)    SFA 50%, stenosis right profunda    Past Surgical History:  Procedure Laterality Date  . BACK SURGERY  2010   lumbar  . CERVICAL SPINE SURGERY  2010  . CORONARY ARTERY BYPASS GRAFT N/A 02/09/2013   Procedure: CORONARY ARTERY BYPASS GRAFTING (CABG);  Surgeon: Kerin Perna, MD;  Location: [facility] OR;  Service: Open Heart Surgery;  Laterality: N/A;  LIMA to the LAD, SVG to right coronary artery, sequential SVG to OM and distal circumflex.  . INTRAOPERATIVE TRANSESOPHAGEAL ECHOCARDIOGRAM N/A 02/09/2013   Procedure: INTRAOPERATIVE TRANSESOPHAGEAL ECHOCARDIOGRAM;  Surgeon: Kerin Perna, MD;  Location: [facility] OR;  Service: Open Heart Surgery;  Laterality: N/A;    MEDICATIONS: . benazepril (LOTENSIN) 10 MG tablet  . Evolocumab (REPATHA SURECLICK) 140 MG/ML SOAJ  . glipiZIDE (GLUCOTROL) 5 MG tablet  . metoprolol tartrate (LOPRESSOR) 25 MG tablet   No current facility-administered medications for this encounter.     Velna Ochs [facility]/Anesthesiology Phone [phone] 06/17/2018 3:52 PM

## 2018-06-24 MED ORDER — TRANEXAMIC ACID-NACL 1000-0.7 MG/100ML-% IV SOLN
1000.0000 mg | INTRAVENOUS | Status: DC
  Filled 2018-06-24: qty 100

## 2018-06-25 ENCOUNTER — Encounter (HOSPITAL_COMMUNITY): Payer: Self-pay

## 2018-06-25 ENCOUNTER — Inpatient Hospital Stay (HOSPITAL_COMMUNITY): Payer: Medicare HMO

## 2018-06-25 ENCOUNTER — Encounter (HOSPITAL_COMMUNITY)
Admission: RE | Disposition: A | Discharge status: Home / Self Care | Payer: Self-pay | Source: Home / Self Care | Attending: Orthopaedic Surgery

## 2018-06-25 ENCOUNTER — Other Ambulatory Visit: Payer: Self-pay

## 2018-06-25 ENCOUNTER — Inpatient Hospital Stay (HOSPITAL_COMMUNITY)
Admission: RE | Admit: 2018-06-25 | Discharge: 2018-06-26 | DRG: 483 | Disposition: A | Discharge status: Home / Self Care | Payer: Medicare HMO | Attending: Orthopaedic Surgery | Admitting: Orthopaedic Surgery

## 2018-06-25 ENCOUNTER — Inpatient Hospital Stay (HOSPITAL_COMMUNITY): Payer: Medicare HMO | Admitting: Certified Registered Nurse Anesthetist

## 2018-06-25 ENCOUNTER — Inpatient Hospital Stay (HOSPITAL_COMMUNITY): Payer: Medicare HMO | Admitting: Vascular Surgery

## 2018-06-25 DIAGNOSIS — Z8249 Family history of ischemic heart disease and other diseases of the circulatory system: Secondary | ICD-10-CM

## 2018-06-25 DIAGNOSIS — I2510 Atherosclerotic heart disease of native coronary artery without angina pectoris: Secondary | ICD-10-CM | POA: Diagnosis present

## 2018-06-25 DIAGNOSIS — M659 Synovitis and tenosynovitis, unspecified: Secondary | ICD-10-CM | POA: Diagnosis present

## 2018-06-25 DIAGNOSIS — E785 Hyperlipidemia, unspecified: Secondary | ICD-10-CM | POA: Diagnosis not present

## 2018-06-25 DIAGNOSIS — E1151 Type 2 diabetes mellitus with diabetic peripheral angiopathy without gangrene: Secondary | ICD-10-CM | POA: Diagnosis present

## 2018-06-25 DIAGNOSIS — M12811 Other specific arthropathies, not elsewhere classified, right shoulder: Secondary | ICD-10-CM | POA: Diagnosis present

## 2018-06-25 DIAGNOSIS — I10 Essential (primary) hypertension: Secondary | ICD-10-CM | POA: Diagnosis present

## 2018-06-25 DIAGNOSIS — Z79899 Other long term (current) drug therapy: Secondary | ICD-10-CM | POA: Diagnosis not present

## 2018-06-25 DIAGNOSIS — Z87891 Personal history of nicotine dependence: Secondary | ICD-10-CM | POA: Diagnosis not present

## 2018-06-25 DIAGNOSIS — M75101 Unspecified rotator cuff tear or rupture of right shoulder, not specified as traumatic: Secondary | ICD-10-CM | POA: Diagnosis not present

## 2018-06-25 DIAGNOSIS — Z888 Allergy status to other drugs, medicaments and biological substances status: Secondary | ICD-10-CM

## 2018-06-25 DIAGNOSIS — Z7984 Long term (current) use of oral hypoglycemic drugs: Secondary | ICD-10-CM | POA: Diagnosis not present

## 2018-06-25 DIAGNOSIS — Z09 Encounter for follow-up examination after completed treatment for conditions other than malignant neoplasm: Secondary | ICD-10-CM

## 2018-06-25 DIAGNOSIS — Z951 Presence of aortocoronary bypass graft: Secondary | ICD-10-CM | POA: Diagnosis not present

## 2018-06-25 DIAGNOSIS — M109 Gout, unspecified: Secondary | ICD-10-CM | POA: Diagnosis not present

## 2018-06-25 DIAGNOSIS — M19011 Primary osteoarthritis, right shoulder: Secondary | ICD-10-CM | POA: Diagnosis present

## 2018-06-25 DIAGNOSIS — Z471 Aftercare following joint replacement surgery: Secondary | ICD-10-CM | POA: Diagnosis not present

## 2018-06-25 DIAGNOSIS — G8918 Other acute postprocedural pain: Secondary | ICD-10-CM | POA: Diagnosis not present

## 2018-06-25 DIAGNOSIS — Z96611 Presence of right artificial shoulder joint: Secondary | ICD-10-CM | POA: Diagnosis not present

## 2018-06-25 HISTORY — PX: REVERSE SHOULDER ARTHROPLASTY: SHX5054

## 2018-06-25 HISTORY — PX: REVERSE TOTAL SHOULDER ARTHROPLASTY: SHX2344

## 2018-06-25 LAB — GLUCOSE, CAPILLARY
GLUCOSE-CAPILLARY: 122 mg/dL — AB (ref 70–99)
Glucose-Capillary: 132 mg/dL — ABNORMAL HIGH (ref 70–99)

## 2018-06-25 SURGERY — ARTHROPLASTY, SHOULDER, TOTAL, REVERSE
Anesthesia: General | Site: Shoulder | Laterality: Right

## 2018-06-25 MED ORDER — LIDOCAINE 2% (20 MG/ML) 5 ML SYRINGE
INTRAMUSCULAR | Status: AC
  Filled 2018-06-25: qty 5

## 2018-06-25 MED ORDER — FENTANYL CITRATE (PF) 100 MCG/2ML IJ SOLN
INTRAMUSCULAR | Status: DC | PRN
  Administered 2018-06-25 (×5): 50 ug via INTRAVENOUS

## 2018-06-25 MED ORDER — FENTANYL CITRATE (PF) 100 MCG/2ML IJ SOLN: 25.0000 ug | INTRAMUSCULAR | Status: DC | PRN

## 2018-06-25 MED ORDER — SODIUM CHLORIDE 0.9 % IV SOLN
INTRAVENOUS | Status: DC | PRN
  Administered 2018-06-25: 25 ug/min via INTRAVENOUS

## 2018-06-25 MED ORDER — METOCLOPRAMIDE HCL 5 MG PO TABS: 5.0000 mg | ORAL_TABLET | Freq: Three times a day (TID) | ORAL | Status: DC | PRN

## 2018-06-25 MED ORDER — PROPOFOL 10 MG/ML IV BOLUS
INTRAVENOUS | Status: AC
  Filled 2018-06-25: qty 40

## 2018-06-25 MED ORDER — OXYCODONE HCL 5 MG PO TABS: 5.0000 mg | ORAL_TABLET | ORAL | Status: DC | PRN

## 2018-06-25 MED ORDER — ONDANSETRON HCL 4 MG PO TABS: 4.0000 mg | ORAL_TABLET | Freq: Four times a day (QID) | ORAL | Status: DC | PRN

## 2018-06-25 MED ORDER — ACETAMINOPHEN 500 MG PO TABS
1000.0000 mg | ORAL_TABLET | Freq: Three times a day (TID) | ORAL | Status: DC
  Administered 2018-06-25 – 2018-06-26 (×2): 1000 mg via ORAL
  Filled 2018-06-25 (×3): qty 2

## 2018-06-25 MED ORDER — SUCCINYLCHOLINE CHLORIDE 200 MG/10ML IV SOSY
PREFILLED_SYRINGE | INTRAVENOUS | Status: AC
  Filled 2018-06-25: qty 10

## 2018-06-25 MED ORDER — BENAZEPRIL HCL 5 MG PO TABS
10.0000 mg | ORAL_TABLET | Freq: Every day | ORAL | Status: DC
  Administered 2018-06-25: 10 mg via ORAL
  Filled 2018-06-25: qty 2

## 2018-06-25 MED ORDER — EPHEDRINE SULFATE 50 MG/ML IJ SOLN
INTRAMUSCULAR | Status: DC | PRN
  Administered 2018-06-25 (×4): 5 mg via INTRAVENOUS

## 2018-06-25 MED ORDER — ONDANSETRON HCL 4 MG/2ML IJ SOLN: 4.0000 mg | Freq: Four times a day (QID) | INTRAMUSCULAR | Status: DC | PRN

## 2018-06-25 MED ORDER — NAPROXEN 250 MG PO TABS
250.0000 mg | ORAL_TABLET | Freq: Two times a day (BID) | ORAL | Status: DC
  Administered 2018-06-26: 250 mg via ORAL
  Filled 2018-06-25 (×3): qty 1

## 2018-06-25 MED ORDER — LIDOCAINE HCL (CARDIAC) PF 100 MG/5ML IV SOSY
PREFILLED_SYRINGE | INTRAVENOUS | Status: DC | PRN
  Administered 2018-06-25: 100 mg via INTRAVENOUS

## 2018-06-25 MED ORDER — 0.9 % SODIUM CHLORIDE (POUR BTL) OPTIME
TOPICAL | Status: DC | PRN
  Administered 2018-06-25: 1000 mL

## 2018-06-25 MED ORDER — GLIPIZIDE 5 MG PO TABS
5.0000 mg | ORAL_TABLET | Freq: Two times a day (BID) | ORAL | Status: DC
  Administered 2018-06-25 – 2018-06-26 (×2): 5 mg via ORAL
  Filled 2018-06-25 (×2): qty 1

## 2018-06-25 MED ORDER — HYDROMORPHONE HCL 1 MG/ML IJ SOLN: 0.5000 mg | INTRAMUSCULAR | Status: DC | PRN

## 2018-06-25 MED ORDER — CEFAZOLIN SODIUM-DEXTROSE 2-4 GM/100ML-% IV SOLN
2.0000 g | INTRAVENOUS | Status: AC
  Administered 2018-06-25: 2 g via INTRAVENOUS

## 2018-06-25 MED ORDER — ROCURONIUM BROMIDE 50 MG/5ML IV SOSY
PREFILLED_SYRINGE | INTRAVENOUS | Status: DC | PRN
  Administered 2018-06-25: 30 mg via INTRAVENOUS
  Administered 2018-06-25: 20 mg via INTRAVENOUS
  Administered 2018-06-25: 50 mg via INTRAVENOUS

## 2018-06-25 MED ORDER — METOCLOPRAMIDE HCL 5 MG/ML IJ SOLN: 10.0000 mg | Freq: Once | INTRAMUSCULAR | Status: DC | PRN

## 2018-06-25 MED ORDER — ONDANSETRON HCL 4 MG/2ML IJ SOLN
INTRAMUSCULAR | Status: AC
  Filled 2018-06-25: qty 2

## 2018-06-25 MED ORDER — SODIUM CHLORIDE 0.9 % IR SOLN
Status: DC | PRN
  Administered 2018-06-25: 3000 mL

## 2018-06-25 MED ORDER — FENTANYL CITRATE (PF) 250 MCG/5ML IJ SOLN
INTRAMUSCULAR | Status: AC
  Filled 2018-06-25: qty 5

## 2018-06-25 MED ORDER — BUPIVACAINE LIPOSOME 1.3 % IJ SUSP
INTRAMUSCULAR | Status: DC | PRN
  Administered 2018-06-25: 10 mL

## 2018-06-25 MED ORDER — ONDANSETRON HCL 4 MG/2ML IJ SOLN
INTRAMUSCULAR | Status: DC | PRN
  Administered 2018-06-25: 4 mg via INTRAVENOUS

## 2018-06-25 MED ORDER — ZOLPIDEM TARTRATE 5 MG PO TABS: 5.0000 mg | ORAL_TABLET | Freq: Every evening | ORAL | Status: DC | PRN

## 2018-06-25 MED ORDER — DOCUSATE SODIUM 100 MG PO CAPS
100.0000 mg | ORAL_CAPSULE | Freq: Two times a day (BID) | ORAL | Status: DC
  Administered 2018-06-25: 100 mg via ORAL
  Filled 2018-06-25 (×2): qty 1

## 2018-06-25 MED ORDER — METOCLOPRAMIDE HCL 5 MG/ML IJ SOLN: 5.0000 mg | Freq: Three times a day (TID) | INTRAMUSCULAR | Status: DC | PRN

## 2018-06-25 MED ORDER — ROCURONIUM BROMIDE 50 MG/5ML IV SOSY
PREFILLED_SYRINGE | INTRAVENOUS | Status: AC
  Filled 2018-06-25: qty 5

## 2018-06-25 MED ORDER — CHLORHEXIDINE GLUCONATE 4 % EX LIQD: 60.0000 mL | Freq: Once | CUTANEOUS | Status: DC

## 2018-06-25 MED ORDER — CEFAZOLIN SODIUM-DEXTROSE 1-4 GM/50ML-% IV SOLN
1.0000 g | Freq: Four times a day (QID) | INTRAVENOUS | Status: AC
  Administered 2018-06-25 – 2018-06-26 (×3): 1 g via INTRAVENOUS
  Filled 2018-06-25 (×3): qty 50

## 2018-06-25 MED ORDER — DIPHENHYDRAMINE HCL 12.5 MG/5ML PO ELIX: 12.5000 mg | ORAL_SOLUTION | ORAL | Status: DC | PRN

## 2018-06-25 MED ORDER — PROPOFOL 10 MG/ML IV BOLUS
INTRAVENOUS | Status: DC | PRN
  Administered 2018-06-25: 150 mg via INTRAVENOUS

## 2018-06-25 MED ORDER — LACTATED RINGERS IV SOLN
INTRAVENOUS | Status: DC | PRN
  Administered 2018-06-25 (×2): via INTRAVENOUS

## 2018-06-25 MED ORDER — SUGAMMADEX SODIUM 200 MG/2ML IV SOLN
INTRAVENOUS | Status: DC | PRN
  Administered 2018-06-25: 207.8 mg via INTRAVENOUS

## 2018-06-25 MED ORDER — EPHEDRINE 5 MG/ML INJ
INTRAVENOUS | Status: AC
  Filled 2018-06-25: qty 10

## 2018-06-25 MED ORDER — MEPERIDINE HCL 50 MG/ML IJ SOLN: 6.2500 mg | INTRAMUSCULAR | Status: DC | PRN

## 2018-06-25 MED ORDER — BUPIVACAINE HCL (PF) 0.5 % IJ SOLN
INTRAMUSCULAR | Status: DC | PRN
  Administered 2018-06-25: 15 mL

## 2018-06-25 SURGICAL SUPPLY — 64 items
BASEPLATE GLENOID STD REV 42 (Joint) ×3 IMPLANT
BASEPLATE REV SHOULDER 29 OD (Plate) ×2 IMPLANT
BASEPLATE REV SHOULDER 29MM OD (Plate) ×1 IMPLANT
BIT DRILL 3.2 PERIPHERAL SCREW (BIT) ×3 IMPLANT
BLADE SAW SAG 73X25 THK (BLADE) ×2
BLADE SAW SGTL 73X25 THK (BLADE) ×1 IMPLANT
CHLORAPREP W/TINT 26ML (MISCELLANEOUS) ×6 IMPLANT
CLOSURE WOUND 1/2 X4 (GAUZE/BANDAGES/DRESSINGS) ×1
COVER SURGICAL LIGHT HANDLE (MISCELLANEOUS) ×3 IMPLANT
COVER WAND RF STERILE (DRAPES) ×3 IMPLANT
DRAPE C-ARM 42X72 X-RAY (DRAPES) IMPLANT
DRAPE HALF SHEET 40X57 (DRAPES) ×3 IMPLANT
DRAPE INCISE IOBAN 66X45 STRL (DRAPES) ×6 IMPLANT
DRAPE ORTHO SPLIT 77X108 STRL (DRAPES) ×4
DRAPE SURG ORHT 6 SPLT 77X108 (DRAPES) ×2 IMPLANT
DRAPE SWITCH (DRAPES) ×3 IMPLANT
DRAPE U-SHAPE 47X51 STRL (DRAPES) IMPLANT
DRSG AQUACEL AG ADV 3.5X 6 (GAUZE/BANDAGES/DRESSINGS) ×3 IMPLANT
ELECT REM PT RETURN 9FT ADLT (ELECTROSURGICAL) ×3
ELECTRODE REM PT RTRN 9FT ADLT (ELECTROSURGICAL) ×1 IMPLANT
GLOVE BIOGEL PI IND STRL 8 (GLOVE) ×1 IMPLANT
GLOVE BIOGEL PI INDICATOR 8 (GLOVE) ×2
GLOVE ECLIPSE 8.0 STRL XLNG CF (GLOVE) ×6 IMPLANT
GOWN STRL REUS W/ TWL LRG LVL3 (GOWN DISPOSABLE) ×1 IMPLANT
GOWN STRL REUS W/ TWL XL LVL3 (GOWN DISPOSABLE) ×1 IMPLANT
GOWN STRL REUS W/TWL LRG LVL3 (GOWN DISPOSABLE) ×2
GOWN STRL REUS W/TWL XL LVL3 (GOWN DISPOSABLE) ×2
GUIDEWIRE GLENOID 2.5X220 (WIRE) ×3 IMPLANT
HANDPIECE INTERPULSE COAX TIP (DISPOSABLE) ×4
IMPL REVERSE SHOULDER 0X3.5 (Shoulder) ×1 IMPLANT
IMPLANT REVERSE SHOULDER 0X3.5 (Shoulder) ×3 IMPLANT
INSERT SHLD REV 42X6 ANGLE B (Insert) ×3 IMPLANT
KIT BASIN OR (CUSTOM PROCEDURE TRAY) ×3 IMPLANT
KIT STABILIZATION SHOULDER (MISCELLANEOUS) ×3 IMPLANT
KIT TURNOVER KIT B (KITS) ×3 IMPLANT
MANIFOLD NEPTUNE II (INSTRUMENTS) ×3 IMPLANT
NEEDLE HYPO 25GX1X1/2 BEV (NEEDLE) IMPLANT
NEEDLE MAYO TROCAR (NEEDLE) IMPLANT
NS IRRIG 1000ML POUR BTL (IV SOLUTION) ×3 IMPLANT
PACK SHOULDER (CUSTOM PROCEDURE TRAY) ×3 IMPLANT
PAD ARMBOARD 7.5X6 YLW CONV (MISCELLANEOUS) ×6 IMPLANT
RESTRAINT HEAD UNIVERSAL NS (MISCELLANEOUS) ×3 IMPLANT
SCREW CENTRAL THREAD 6.5X45 (Screw) ×3 IMPLANT
SCREW PERIPHERAL 42 (Screw) ×3 IMPLANT
SCREW PERIPHERAL 5.0X34 (Screw) ×3 IMPLANT
SET HNDPC FAN SPRY TIP SCT (DISPOSABLE) ×2 IMPLANT
SPONGE LAP 18X18 X RAY DECT (DISPOSABLE) ×9 IMPLANT
STEM HUMERAL SZ4B STD 78 PTC (Stem) ×3 IMPLANT
STRIP CLOSURE SKIN 1/2X4 (GAUZE/BANDAGES/DRESSINGS) ×2 IMPLANT
SUCTION FRAZIER HANDLE 10FR (MISCELLANEOUS)
SUCTION TUBE FRAZIER 10FR DISP (MISCELLANEOUS) IMPLANT
SUT ETHIBOND 2 V 37 (SUTURE) ×3 IMPLANT
SUT ETHIBOND NAB CT1 #1 30IN (SUTURE) ×3 IMPLANT
SUT FIBERWIRE #5 38 CONV NDL (SUTURE) ×12
SUT MNCRL AB 3-0 PS2 18 (SUTURE) ×3 IMPLANT
SUT VIC AB 2-0 CT1 27 (SUTURE) ×2
SUT VIC AB 2-0 CT1 TAPERPNT 27 (SUTURE) ×1 IMPLANT
SUTURE FIBERWR #5 38 CONV NDL (SUTURE) ×4 IMPLANT
TOWEL OR 17X26 10 PK STRL BLUE (TOWEL DISPOSABLE) ×3 IMPLANT
TRAY FOLEY W/BAG SLVR 14FR (SET/KITS/TRAYS/PACK) IMPLANT
TUBING BULK SUCTION (MISCELLANEOUS) ×3 IMPLANT
TUBING SILASTIC 2043.51 (MISCELLANEOUS) ×3 IMPLANT
WATER STERILE IRR 1000ML POUR (IV SOLUTION) ×3 IMPLANT
YANKAUER SUCT BULB TIP NO VENT (SUCTIONS) ×3 IMPLANT

## 2018-06-25 NOTE — Transfer of Care (Signed)
Immediate Anesthesia Transfer of Care Note  Patient: Tony [name]  Procedure(s) Performed: RIGHT REVERSE SHOULDER ARTHROPLASTY (Right Shoulder)  Patient Location: PACU  Anesthesia Type:General  Level of Consciousness: sedated and drowsy  Airway & Oxygen Therapy: Patient Spontanous Breathing and Patient connected to face mask oxygen  Post-op Assessment: Report given to RN and Post -op Vital signs reviewed and stable  Post vital signs: Reviewed and stable  Last Vitals:  Vitals Value Taken Time  BP 154/80 06/25/2018  9:52 AM  Temp    Pulse 78 06/25/2018  9:53 AM  Resp 12 06/25/2018  9:53 AM  SpO2 100 % 06/25/2018  9:53 AM  Vitals shown include unvalidated device data.  Last Pain:  Vitals:   06/25/18 0606  TempSrc:   PainSc: 0-No pain      Patients Stated Pain Goal: 3 (06/25/18 0606)  Complications: No apparent anesthesia complications

## 2018-06-25 NOTE — Anesthesia Preprocedure Evaluation (Signed)
Anesthesia Evaluation  Patient identified by MRN, date of birth, ID band Patient awake    Reviewed: Allergy & Precautions, NPO status , Patient's Chart, lab work & pertinent test results  Airway Mallampati: II  TM Distance: >3 FB Neck ROM: Full    Dental no notable dental hx.    Pulmonary former smoker,    Pulmonary exam normal breath sounds clear to auscultation       Cardiovascular hypertension, + CAD, + CABG (2014) and + Peripheral Vascular Disease  Normal cardiovascular exam Rhythm:Regular Rate:Normal     Neuro/Psych negative neurological ROS  negative psych ROS   GI/Hepatic negative GI ROS, Neg liver ROS,   Endo/Other  diabetes, Type 2, Oral Hypoglycemic Agents  Renal/GU negative Renal ROS  negative genitourinary   Musculoskeletal negative musculoskeletal ROS (+)   Abdominal   Peds negative pediatric ROS (+)  Hematology negative hematology ROS (+)   Anesthesia Other Findings   Reproductive/Obstetrics negative OB ROS                             Anesthesia Physical Anesthesia Plan  ASA: III  Anesthesia Plan: General   Post-op Pain Management:  Regional for Post-op pain   Induction: Intravenous  PONV Risk Score and Plan: 2 and Ondansetron and Treatment may vary due to age or medical condition  Airway Management Planned: Oral ETT  Additional Equipment:   Intra-op Plan:   Post-operative Plan: Extubation in OR  Informed Consent: I have reviewed the patients History and Physical, chart, labs and discussed the procedure including the risks, benefits and alternatives for the proposed anesthesia with the patient or authorized representative who has indicated his/her understanding and acceptance.   Dental advisory given  Plan Discussed with: CRNA  Anesthesia Plan Comments:         Anesthesia Quick Evaluation

## 2018-06-25 NOTE — H&P (Signed)
PREOPERATIVE H&P  Chief Complaint: djd right shoulder  HPI: Tony [name] is a 77 y.o. male who presents for preoperative history and physical with a diagnosis of djd right shoulder. Symptoms are rated as moderate to severe, and have been worsening.  This is significantly impairing activities of daily living.  Please see my clinic note for full details on this patient's care.  He has elected for surgical management.   Past Medical History:  Diagnosis Date  . Anginal pain (HCC)    2014  . Arthritis   . Carotid artery disease (HCC)    left ICA occlusion  . Coronary artery disease   . Diabetes mellitus Age 50   Type 2  . Gout   . Hyperlipidemia   . Hypertension   . Joint pain   . Leg pain   . PVD (peripheral vascular disease) (HCC)    SFA 50%, stenosis right profunda   Past Surgical History:  Procedure Laterality Date  . BACK SURGERY  2010   lumbar  . CERVICAL SPINE SURGERY  2010  . CORONARY ARTERY BYPASS GRAFT N/A 02/09/2013   Procedure: CORONARY ARTERY BYPASS GRAFTING (CABG);  Surgeon: Kerin Perna, MD;  Location: [facility] OR;  Service: Open Heart Surgery;  Laterality: N/A;  LIMA to the LAD, SVG to right coronary artery, sequential SVG to OM and distal circumflex.  . INTRAOPERATIVE TRANSESOPHAGEAL ECHOCARDIOGRAM N/A 02/09/2013   Procedure: INTRAOPERATIVE TRANSESOPHAGEAL ECHOCARDIOGRAM;  Surgeon: Kerin Perna, MD;  Location: [facility] OR;  Service: Open Heart Surgery;  Laterality: N/A;   Social History   Socioeconomic History  . Marital status: Married    Spouse name: Not on file  . Number of children: Not on file  . Years of education: Not on file  . Highest education level: Not on file  Occupational History  . Not on file  Social Needs  . Financial resource strain: Not on file  . Food insecurity:    Worry: Not on file    Inability: Not on file  . Transportation needs:    Medical: Not on file    Non-medical: Not on file  Tobacco Use  . Smoking status: Former Smoker   Packs/day: 3.00    Years: 40.00    Pack years: 120.00    Types: Cigarettes    Last attempt to quit: 05/27/1992    Years since quitting: 26.0  . Smokeless tobacco: Never Used  Substance and Sexual Activity  . Alcohol use: No  . Drug use: No  . Sexual activity: Not on file  Lifestyle  . Physical activity:    Days per week: Not on file    Minutes per session: Not on file  . Stress: Not on file  Relationships  . Social connections:    Talks on phone: Not on file    Gets together: Not on file    Attends religious service: Not on file    Active member of club or organization: Not on file    Attends meetings of clubs or organizations: Not on file    Relationship status: Not on file  Other Topics Concern  . Not on file  Social History Narrative   Lives at home with wife.          Family History  Problem Relation Age of Onset  . Heart failure Mother 85  . Cancer Sister    Allergies  Allergen Reactions  . Aspirin Other (See Comments)    Causes gout flare   Prior  to Admission medications   Medication Sig Start Date End Date Taking? Authorizing Provider  benazepril (LOTENSIN) 10 MG tablet Take 10 mg by mouth at bedtime.    Yes [provider]  Evolocumab (REPATHA SURECLICK) 140 MG/ML SOAJ Inject 140 mg into the skin every 14 (fourteen) days. 04/25/18  Yes [name], MD  glipiZIDE (GLUCOTROL) 5 MG tablet Take 5 mg by mouth 2 (two) times daily.    Yes [provider]  metoprolol tartrate (LOPRESSOR) 25 MG tablet Take 1 tablet (25 mg total) by mouth 2 (two) times daily. Patient not taking: Reported on 06/09/2018 07/27/15   [name], MD     Positive ROS: All other systems have been reviewed and were otherwise negative with the exception of those mentioned in the HPI and as above.  Physical Exam: General: Alert, no acute distress Cardiovascular: No pedal edema Respiratory: No cyanosis, no use of accessory musculature GI: No organomegaly, abdomen is  soft and non-tender Skin: No lesions in the area of chief complaint Neurologic: Sensation intact distally Psychiatric: Patient is competent for consent with normal mood and affect Lymphatic: No axillary or cervical lymphadenopathy  MUSCULOSKELETAL: R shoulder: weak supraspinatus, ROM 160 with pain, no obvious lesion or other abnormality   Assessment: djd right shoulder  Plan: Plan for Procedure(s): RIGHT REVERSE SHOULDER ARTHROPLASTY  The risks benefits and alternatives were discussed with the patient including but not limited to the risks of nonoperative treatment, versus surgical intervention including infection, bleeding, nerve injury,  blood clots, cardiopulmonary complications, morbidity, mortality, among others, and they were willing to proceed.    We additionally specifically discussed risks of axillary nerve injury, infection, dislocation, periprosthetic fracture, continued pain and longevity of implants prior to beginning procedure.    The patient primarily has an irreparable cuff tear and though we discussed potentially waiting until his condition deteriorated further he wanted to proceed which we felt was a reasonable decision.  All questions answered.    Bjorn Pippin, MD  06/25/2018 6:57 AM

## 2018-06-25 NOTE — Plan of Care (Signed)
  Problem: Education: Goal: Knowledge of General Education information will improve Description Including pain rating scale, medication(s)/side effects and non-pharmacologic comfort measures Outcome: Progressing   Problem: Activity: Goal: Risk for activity intolerance will decrease Outcome: Progressing   Problem: Elimination: Goal: Will not experience complications related to urinary retention Outcome: Completed/Met   Problem: Education: Goal: Knowledge of the prescribed therapeutic regimen will improve Outcome: Progressing Goal: Understanding of activity limitations/precautions following surgery will improve Outcome: Completed/Met

## 2018-06-25 NOTE — Progress Notes (Signed)
Orthopedic Tech Progress Note Patient Details:  Tony [name] 1941/07/28 [id number]  Ortho Devices Type of Ortho Device: Shoulder abduction pillow Ortho Device/Splint Interventions: Ordered    as ordered by Dr. Rosezella Florida, Tony [name] 06/25/2018, 9:21 AM

## 2018-06-25 NOTE — Anesthesia Procedure Notes (Signed)
Anesthesia Regional Block: Supraclavicular block   Pre-Anesthetic Checklist: ,, timeout performed, Correct Patient, Correct Site, Correct Laterality, Correct Procedure, Correct Position, site marked, Risks and benefits discussed,  Surgical consent,  Pre-op evaluation,  At surgeon's request and post-op pain management  Laterality: Right and Upper  Prep: Maximum Sterile Barrier Precautions used, chloraprep       Needles:  Injection technique: Single-shot  Needle Type: Echogenic Stimulator Needle     Needle Length: 10cm      Additional Needles:   Procedures:,,,, ultrasound used (permanent image in chart),,,,  Narrative:  Start time: 06/25/2018 7:00 AM End time: 06/25/2018 7:05 AM Injection made incrementally with aspirations every 5 mL.  Performed by: Personally  Anesthesiologist: Phillips Grout, MD  Additional Notes: Risks, benefits and alternative to block explained extensively.  Patient tolerated procedure well, without complications.

## 2018-06-25 NOTE — Anesthesia Postprocedure Evaluation (Signed)
Anesthesia Post Note  Patient: Tony [name]  Procedure(s) Performed: RIGHT REVERSE SHOULDER ARTHROPLASTY (Right Shoulder)     Patient location during evaluation: PACU Anesthesia Type: General Level of consciousness: awake and alert Pain management: pain level controlled Vital Signs Assessment: post-procedure vital signs reviewed and stable Respiratory status: spontaneous breathing, nonlabored ventilation, respiratory function stable and patient connected to nasal cannula oxygen Cardiovascular status: blood pressure returned to baseline and stable Postop Assessment: no apparent nausea or vomiting Anesthetic complications: no    Last Vitals:  Vitals:   06/25/18 1037 06/25/18 1045  BP: 130/79   Pulse: 64 62  Resp: 16 16  Temp:  (!) 36.3 C  SpO2: 98% 96%    Last Pain:  Vitals:   06/25/18 1045  TempSrc:   PainSc: 0-No pain                 Phillips Grout

## 2018-06-25 NOTE — Op Note (Signed)
Orthopaedic Surgery Operative Note (CSN: [id number])  [name]  Dec 31, 1940 Date of Surgery: 06/25/2018   Diagnoses:  Irreparable cuff tear  Procedure: Right reverse total shoulder arthroplasty   Operative Finding Successful completion of planned procedure.  Good stability with minimal tension at end of case, axillary nerve intact  Post-operative plan: The patient will be NWB in sling.  The patient will be monitored overnight.  DVT prophylaxis not indicated in isolated upper extremity surgery patient with no specific risks factors .  Pain control with PRN pain medication preferring oral medicines.  Follow up plan will be scheduled in approximately 7 days for incision check and XR.  Post-Op Diagnosis: Same Surgeons:Primary: Tony Gash, MD Assistants:Tony [name] Location: [facility] OR ROOM 06 Anesthesia: Choice Antibiotics: Ancef 2g preop, Vancomycin 1071m locally  Tourniquet time: * No tourniquets in log * Estimated Blood Loss: 1976Complications: None\ Specimens: None\ Implants: Implant Name Type Inv. Item Serial No. Manufacturer Lot No. LRB No. Used Action  BASEPLATE REV SHOULDER 273ALOD - [id number] BASEPLATE REV SHOULDER 235HGOD [id number]  Right 1 Implanted  SCREW CENTRAL THREAD 6.5X45 - [id number] SCREW CENTRAL THREAD 6.5X45  [employer]  Right 1 Implanted  SCREW PERIPHERAL 5.0X34 - [id number] SCREW PERIPHERAL 5.0X34  [employer]  Right 1 Implanted  SCREW PERIPHERAL 42 - [id number] SCREW PERIPHERAL 42  [employer]  Right 1 Implanted  BASEPLATE GLENOID STD REV 42 - [id number] BASEPLATE GLENOID STD REV 42 [id number] [employer]  Right 1 Implanted  STEM HUMERAL SZ4B STD 78 PTC - [id number] STEM HUMERAL SZ4B STD 78 PTC [id number]  Right 1 Implanted  INSERT SHLD REV 42X6 ANGLE B - [id number] INSERT SHLD REV 42X6 ANGLE B [id number]  Right 1 Implanted  IMPLANT REVERSE SHOULDER 0X3.5 -- [id number] Shoulder IMPLANT REVERSE SHOULDER 0X3.5 [id number]  Right 1 Implanted    Indications for Surgery:   Tony JUNKINSis a 77y.o. male with irreparable rotator cuff tear and recalcitrant pain refractory to non-op management including activity modification, injection and PT.  Benefits and risks of operative and nonoperative management were discussed prior to surgery with patient/guardian(s) and informed consent form was completed.  Specific risks including infection, need for additional surgery, dislocation, axillary nerve injury, preprosthetic fracture, component longevity and continued pain.   Procedure:   The patient was identified in the preoperative holding area where the surgical site was marked. The patient was taken to the OR where a procedural timeout was called and the above noted anesthesia was induced.  The patient was positioned beachchair on allen table.  Preoperative antibiotics were dosed.  The patient's right shoulder was prepped and draped in the usual sterile fashion.  A second preoperative timeout was called.      Standard deltopectoral approach was performed with a #10 blade. We dissected down to the subcutaneous tissues and the cephalic vein was taken laterally with the deltoid. Clavipectoral fascia was incised in line with the incision. Deep retractors were placed. The long of the biceps tendon was identified and there was significant tenosynovitis present.  Tenodesis was performed to the pectoralis tendon with #2 Ethibond. The remaining biceps was followed up into the rotator interval where it was released.   The subscapularis was taken down in a full thickness layer with capsule along the humeral neck extending inferiorly around the humeral head. We continued releasing the capsule directly off of the osteophytes inferiorly  all the way around the corner. This allowed [place] to dislocate the humeral head.   The humeral head had minimal wear but the cuff was carefully examined  and noted to be irreperably torn.  The decision was confirmed that a reverse total shoulder was indicated for this patient.  A humeral cutting guide was inserted down the intramedullary canal. The version was set at 20 of retroversion. Humeral osteotomy was performed with an oscillating saw. The head fragment was passed off the back table. A starter awl was used to open the humeral canal. We next used T-handle straight sound reamers to ream up to an appropriate fit. A chisel was used to remove proximal humeral bone. We then broached starting with a size one broach and broaching up to 4 which obtained an appropriate fit. The broach handle was removed. A cut protector was placed. The broach handle was removed and a cut protector was placed. The humerus was retracted posteriorly and we turned our attention to glenoid exposure.  The subscapularis was again identified and immediately we took care to palpate the axillary nerve anteriorly and verify its position with gentle palpation as well as the tug test.  We then released the SGHL with bovie cautery prior to placing a curved mayo at the junction of the anterior glenoid well above the axillary nerve and bluntly dissecting the subscapularis from the capsule.  We then carefully protected the axillary nerve as we gently released the inferior capsule to fully mobilize the subscapularis.  An anterior deltoid retractor was then placed as well as a small Hohmann retractor superiorly.   The glenoid was relatively intact as this was an irreparable rotator cuff tear case. The remaining labrum was removed circumferentially taking great care not to disrupt the posterior capsule.   The glenoid drill guide was placed and used to drill a guide pin in the center, inferior position. The glenoid face was then reamed concentrically over the guide wire. The center hole was drilled over the guidepin in a near anatomic angle of version. Next the glenoid vault was drilled back to a  depth of 45 mm.  We tapped and then placed a 4m size baseplate with 0 lateralization was selected with a 45 mm x 6.5 mm length central screw.  The base plate was screwed into the glenoid vault obtaining secure fixation. We next placed superior and inferior locking screws for additional fixation.  Next a 42 mm glenosphere was selected and impacted onto the baseplate. The center screw was tightened.  We turned attention back to the humeral side. The cut protector was removed. We trialed with multiple size tray and polyethylene options and selected a 6 which provided good stability and range of motion without excess soft tissue tension. The offset was dialed in to match the normal anatomy. The shoulder was trialed.  There was good ROM in all planes and the shoulder was stable with no inferior translation.  The real humeral implants were opened after again confirming sizes.  The trial was removed. #5 Ethibond sutures passed through the humeral neck for subscap repair. The humeral component was press-fit obtaining a secure fit. A +0 high offset tray was selected and impacted onto the stem.  A 36+6 polyethylene liner was impacted onto the stem.  The joint was reduced and thoroughly irrigated with pulsatile lavage. Subscap was repaired back with #5 Fiberwire sutures through bone tunnels. Hemostasis was obtained. The deltopectoral interval was reapproximated with #1 Ethibond. The subcutaneous tissues were closed with 2-0  Vicryl and the skin was closed with running monocryl.    The wounds were cleaned and dried and an Aquacel dressing was placed. The drapes taken down. The arm was placed into sling with abduction pillow. Patient was awakened, extubated, and transferred to the recovery room in stable condition. There were no intraoperative complications. The sponge, needle, and attention counts were  correct at the end of the case.     Joya Gaskins, OPA-C, present and scrubbed throughout the case, critical for  completion in a timely fashion, and for retraction, instrumentation, closure.

## 2018-06-25 NOTE — Anesthesia Procedure Notes (Signed)
Procedure Name: Intubation Date/Time: 06/25/2018 7:41 AM Performed by: Page Spiro, CRNA Pre-anesthesia Checklist: Patient identified, Emergency Drugs available, Suction available, Patient being monitored and Timeout performed Patient Re-evaluated:Patient Re-evaluated prior to induction Oxygen Delivery Method: Circle system utilized Preoxygenation: Pre-oxygenation with 100% oxygen Induction Type: IV induction Ventilation: Mask ventilation without difficulty and Oral airway inserted - appropriate to patient size Laryngoscope Size: Hyacinth Meeker and 2 Grade View: Grade I Tube type: Oral Tube size: 7.5 mm Number of attempts: 1 Airway Equipment and Method: Patient positioned with wedge pillow and Oral airway Placement Confirmation: ETT inserted through vocal cords under direct vision,  positive ETCO2 and breath sounds checked- equal and bilateral Secured at: 24 cm Tube secured with: Tape Dental Injury: Teeth and Oropharynx as per pre-operative assessment

## 2018-06-26 ENCOUNTER — Encounter (HOSPITAL_COMMUNITY): Payer: Self-pay | Admitting: Orthopaedic Surgery

## 2018-06-26 MED ORDER — ACETAMINOPHEN 500 MG PO TABS: 1000.0000 mg | ORAL_TABLET | Freq: Three times a day (TID) | ORAL | 0 refills | Status: AC

## 2018-06-26 MED ORDER — CALCIUM CARBONATE ANTACID 500 MG PO CHEW
200.0000 mg | CHEWABLE_TABLET | Freq: Two times a day (BID) | ORAL | Status: DC
  Administered 2018-06-26 (×2): 200 mg via ORAL
  Filled 2018-06-26 (×2): qty 1

## 2018-06-26 MED ORDER — OXYCODONE HCL 5 MG PO TABS: ORAL_TABLET | ORAL | 0 refills | Status: AC

## 2018-06-26 MED ORDER — ONDANSETRON HCL 4 MG PO TABS: 4.0000 mg | ORAL_TABLET | Freq: Three times a day (TID) | ORAL | 1 refills | Status: AC | PRN

## 2018-06-26 NOTE — Discharge Summary (Signed)
Patient ID: Tony [name] MRN: [id number] DOB/AGE: May 12, 1941 77 y.o.  Admit date: 06/25/2018 Discharge date: 06/26/2018  Admission Diagnoses:R irreparable cuff tear  Discharge Diagnoses:  Active Problems:   Rotator cuff tear arthropathy, right   Past Medical History:  Diagnosis Date  . Anginal pain (HCC)    2014  . Arthritis   . Carotid artery disease (HCC)    left ICA occlusion  . Coronary artery disease   . Diabetes mellitus Age 80   Type 2  . Gout   . Hyperlipidemia   . Hypertension   . Joint pain   . Leg pain   . PVD (peripheral vascular disease) (HCC)    SFA 50%, stenosis right profunda     Procedures Performed: Right reverse total shoulder  Discharged Condition: good  Hospital Course: Patient brought in as an outpatient for surgery.  Tolerated procedure well.  Was kept for monitoring overnight for pain control and medical monitoring postop and was found to be stable for DC home the morning after surgery.  Patient was instructed on specific activity restrictions and all questions were answered.   Consults: None  Significant Diagnostic Studies: No additional pertinent studies  Treatments: Surgery  Discharge Exam:  Dressing CDI and sling well fitting,  full and painless ROM throughout hand with DPC of 0. + Motor in  AIN, PIN, Ulnar distributions. Axillary nerve sensation preserved and symmetric.  Sensation intact in medial, radial, and ulnar distributions. Well perfused digits.     Disposition: Discharge disposition: 01-Home or Self Care       Discharge Instructions    Call MD for:  persistant nausea and vomiting   Complete by:  As directed    Call MD for:  redness, tenderness, or signs of infection (pain, swelling, redness, odor or green/yellow discharge around incision site)   Complete by:  As directed    Call MD for:  severe uncontrolled pain   Complete by:  As directed    Diet - low sodium heart healthy   Complete by:  As directed    Discharge instructions   Complete by:  As directed    Ramond Marrow MD, MPH [facility] [address], [address] [phone] (tel)   [phone] (fax)   POST-OPERATIVE INSTRUCTIONS - TOTAL SHOULDER REPLACEMENT    WOUND CARE You may leave the operative dressing in place until your follow-up appointment. KEEP THE INCISIONS CLEAN AND DRY. Use the Cryocuff, GameReady or Ice as often as possible for the first 3-4 days, then as needed for pain relief.  You may shower on Post-Op Day #2. The dressing is water resistant but do not scrub it as it may start to peel up.  You may remove the sling for showering, but keep a water resistant pillow under the arm to keep both the elbow and shoulder away from the body (mimicking the abduction sling). Gently pat the area dry. Do not soak the shoulder in water. Do not go swimming in the pool or ocean until your sutures are removed.  EXERCISES Wear the sling at all times except when doing your exercises. You may remove the sling for showering, but keep the arm across the chest or in a secondary sling.   Accidental/Purposeful External Rotation and shoulder flexion (reaching behind you) is to be avoided at all costs for the first month. Please perform the exercises:   Elbow / Hand / Wrist  Range of Motion Exercises POST-OP A multi-modal approach will be used  to treat your pain. Oxycodone - This is a strong narcotic, to be used only on an "as needed" basis for pain. Meloxicam- An anti-inflammatory medication Acetaminophen - A non-narcotic pain medicine.  Use 1000mg  three times a day for the first 14 days after surgery If you have any adverse effects with the medications, please call our office.  FOLLOW-UP If you develop a Fever (>101.5), Redness or Drainage from the surgical incision site, please call our office to arrange for an evaluation. Please call the office to schedule a follow-up appointment for a wound check, 7-10 days  post-operatively.    IF YOU HAVE ANY QUESTIONS, PLEASE FEEL FREE TO CALL OUR OFFICE.   HELPFUL INFORMATION  Your arm will be in a sling following surgery. You will be in this sling for the next 3-4 weeks.  I will let you know the exact duration at your follow-up visit.  You may be more comfortable sleeping in a semi-seated position the first few nights following surgery.  Keep a pillow propped under the elbow and forearm for comfort.  If you have a recliner type of chair it might be beneficial.  If not that is fine too, but it would be helpful to sleep propped up with pillows behind your operated shoulder as well under your elbow and forearm.  This will reduce pulling on the suture lines.  We suggest you use the pain medication the first night prior to going to bed, in order to ease any pain when the anesthesia wears off. You should avoid taking pain medications on an empty stomach as it will make you nauseous.  Do not drink alcoholic beverages or take illicit drugs when taking pain medications.  In most states it is against the law to drive while your arm is in a sling. And certainly against the law to drive while taking narcotics.  You may return to work/school in the next couple of days when you feel up to it. Desk work and typing in the sling is fine.  When dressing, put your operative arm in the sleeve first.  When getting undressed, take your operative arm out last.  Loose fitting, button-down shirts are recommended.  Pain medication may make you constipated.  Below are a few solutions to try in this order: Decrease the amount of pain medication if you aren't having pain. Drink lots of decaffeinated fluids. Drink prune juice and/or each dried prunes  If the first 3 don't work start with additional solutions Take Colace - an over-the-counter stool softener Take Senokot - an over-the-counter laxative Take Miralax - a stronger over-the-counter laxative   Increase activity slowly    Complete by:  As directed      Allergies as of 06/26/2018      Reactions   Aspirin Other (See Comments)   Causes gout flare      Medication List    TAKE these medications   acetaminophen 500 MG tablet Commonly known as:  TYLENOL Take 2 tablets (1,000 mg total) by mouth every 8 (eight) hours for 14 days.   benazepril 10 MG tablet Commonly known as:  LOTENSIN Take 10 mg by mouth at bedtime.   Evolocumab 140 MG/ML Soaj Inject 140 mg into the skin every 14 (fourteen) days.   glipiZIDE 5 MG tablet Commonly known as:  GLUCOTROL Take 5 mg by mouth 2 (two) times daily.   metoprolol tartrate 25 MG tablet Commonly known as:  LOPRESSOR Take 1 tablet (25 mg total) by mouth 2 (two)  times daily.   ondansetron 4 MG tablet Commonly known as:  ZOFRAN Take 1 tablet (4 mg total) by mouth every 8 (eight) hours as needed for up to 7 days for nausea or vomiting.   oxyCODONE 5 MG immediate release tablet Commonly known as:  Oxy IR/ROXICODONE Take 1-2 pills every 6 hrs as needed for pain, no more than 6 per day

## 2018-06-26 NOTE — Progress Notes (Addendum)
Occupational Therapy Evaluation (Late Entry) Patient Details Name: Tony [name] MRN: [id number] DOB: [birth year] Today's Date: 06/26/2018  Clinical Impression: Pt seen for shoulder compensatory strategies with ADL. HEP educated as seen below (elbow wrist and hand only) - no shoulder movement. Pt educated in sling management, position for sleeping, bathing, dressing, and HEP. Pt then assisted to get fully dressed at end of session after education. Pt with no questions or concerns at end of session. Thank you for the opportunity to serve this patient.     06/26/18 1000  OT Visit Information  Last OT Received On 06/26/18  Assistance Needed +1  History of Present Illness Pt is a 77 y/o s/p Rotator cuff tear arthropathy, right  Precautions  Precautions Shoulder  Type of Shoulder Precautions conservative  Shoulder Interventions Shoulder sling/immobilizer;At all times;Off for dressing/bathing/exercises  Precaution Booklet Issued Yes (comment)  Precaution Comments handout reviewed in full  Required Braces or Orthoses Sling  Restrictions  Weight Bearing Restrictions Yes  RUE Weight Bearing NWB  Home Living  Family/patient expects to be discharged to: Private residence  Living Arrangements Spouse/significant other  Available Help at Discharge Family;Available PRN/intermittently  Type of Home Mobile home  Home Access Stairs to enter  Entrance Stairs-Number of Steps 3  Entrance Stairs-Rails Right;Left;Can reach both  Home Layout One level  Bathroom Shower/Tub Walk-in shower  Bathroom Toilet Handicapped height  Bathroom Accessibility Yes  How Accessible Accessible via walker  Home Equipment Walker - 2 wheels;Shower seat - built in;Cane - single point;Hand held shower head  Prior Function  Level of Independence Independent  Communication  Communication No difficulties  Pain Assessment  Pain Assessment 0-10  Pain Score 1  Pain Location R shoulder  Pain Descriptors / Indicators Dull   Pain Intervention(s) Limited activity within patient's tolerance;Monitored during session;Other (comment) (block still largely in place)  Cognition  Arousal/Alertness Awake/alert  Behavior During Therapy WFL for tasks assessed/performed  Overall Cognitive Status Within Functional Limits for tasks assessed  Upper Extremity Assessment  Upper Extremity Assessment RUE deficits/detail  RUE Deficits / Details deficits as anticipated post-op  RUE Unable to fully assess due to immobilization  RUE Sensation decreased light touch (block still largely in place)  RUE Coordination decreased fine motor;decreased gross motor  Lower Extremity Assessment  Lower Extremity Assessment Overall WFL for tasks assessed  Cervical / Trunk Assessment  Cervical / Trunk Assessment Normal  ADL  General ADL Comments see shoulder section below  Vision- History  Baseline Vision/History Wears glasses  Wears Glasses At all times  Patient Visual Report No change from baseline  Bed Mobility  Overal bed mobility Independent  Transfers  Overall transfer level Independent  Balance  Overall balance assessment Mild deficits observed, not formally tested  General Comments  General comments (skin integrity, edema, etc.) fiance present throughout session  Exercises  Exercises Shoulder  Shoulder Instructions  Donning/doffing shirt without moving shoulder Maximal assistance;Caregiver independent with task;Patient able to independently direct caregiver  Method for sponge bathing under operated UE Supervision/safety  Donning/doffing sling/immobilizer Moderate assistance;Patient able to independently direct caregiver;Caregiver independent with task  Correct positioning of sling/immobilizer Moderate assistance;Caregiver independent with task;Patient able to independently direct caregiver  ROM for elbow, wrist and digits of operated UE  (educated - Pt unable to perform due to block)  Sling wearing schedule (on at all  times/off for ADL's) Independent  Proper positioning of operated UE when showering Supervision/safety;Caregiver independent with task  Positioning of UE while sleeping Minimal assistance;Caregiver independent with task  Shoulder Exercises  Elbow Flexion AROM;Right;10 reps;Standing (educated only - block still in place)  Elbow Extension AROM;Right;10 reps;Standing (educated only - block still in place)  Wrist Flexion Right;10 reps;Seated (educated only - block still in place)  Wrist Extension AROM;Right;10 reps;Other (comment) (educated only - block still in place)  Digit Composite Flexion AROM;Right;10 reps;Other (comment) (starting to come back - block still in place)  Composite Extension AROM;Right;10 reps;Other (comment) (just starting to move; block still in place)  Neck Flexion AROM  Neck Extension AROM  Neck Lateral Flexion - Right AROM  Neck Lateral Flexion - Left AROM  OT - End of Session  Equipment Utilized During Treatment Other (comment) (sling)  Activity Tolerance Patient tolerated treatment well  Patient left in bed;with call bell/phone within reach;with family/visitor present  Nurse Communication Mobility status;Weight bearing status  OT Assessment  OT Recommendation/Assessment Progress rehab of shoulder as ordered by MD at follow-up appointment  OT Visit Diagnosis Pain  Pain - Right/Left Right  Pain - part of body Shoulder  OT Problem List Decreased range of motion;Decreased knowledge of precautions;Impaired UE functional use;Pain  AM-PAC OT "6 Clicks" Daily Activity Outcome Measure  Help from another person eating meals? 3  Help from another person taking care of personal grooming? 3  Help from another person toileting, which includes using toliet, bedpan, or urinal? 3  Help from another person bathing (including washing, rinsing, drying)? 2  Help from another person to put on and taking off regular upper body clothing? 2  Help from another person to put on and  taking off regular lower body clothing? 3  6 Click Score 16  ADL G Code Conversion CK  OT Recommendation  Follow Up Recommendations Follow surgeon's recommendation for DC plan and follow-up therapies  OT Equipment None recommended by OT  Acute Rehab OT Goals  Patient Stated Goal to get back to playing golf  OT Goal Formulation With patient/family  Time For Goal Achievement 07/10/18  Potential to Achieve Goals Good  OT Time Calculation  OT Start Time (ACUTE ONLY) 1115  OT Stop Time (ACUTE ONLY) 1200  OT Time Calculation (min) 45 min  OT General Charges  $OT Visit 1 Visit  OT Evaluation  $OT Eval Moderate Complexity 1 Mod  OT Treatments  $Self Care/Home Management  8-22 mins  $Therapeutic Exercise 8-22 mins  Written Expression  Dominant Hand Left   Sherryl Manges OTR/L Acute Rehabilitation Services Pager: [phone] Office: [phone]

## 2018-06-26 NOTE — Plan of Care (Signed)

## 2018-06-26 NOTE — Progress Notes (Signed)
Provided discharge education/instructions, all questions and concerns addressed, discharged home with belongings accompanied by wife. 

## 2018-06-30 ENCOUNTER — Telehealth: Payer: Self-pay | Admitting: Pharmacist

## 2018-06-30 DIAGNOSIS — E785 Hyperlipidemia, unspecified: Secondary | ICD-10-CM

## 2018-06-30 NOTE — Telephone Encounter (Signed)
New form for [employer] SafetyNet 2020 mailed to patient. Need to return complete form to office ASAP and complete fasting blood work.   

## 2018-07-03 DIAGNOSIS — M75101 Unspecified rotator cuff tear or rupture of right shoulder, not specified as traumatic: Secondary | ICD-10-CM | POA: Diagnosis not present

## 2018-07-11 DIAGNOSIS — M75101 Unspecified rotator cuff tear or rupture of right shoulder, not specified as traumatic: Secondary | ICD-10-CM | POA: Diagnosis not present

## 2018-07-14 DIAGNOSIS — M19011 Primary osteoarthritis, right shoulder: Secondary | ICD-10-CM | POA: Diagnosis not present

## 2018-07-14 DIAGNOSIS — M25511 Pain in right shoulder: Secondary | ICD-10-CM | POA: Diagnosis not present

## 2018-07-17 DIAGNOSIS — E7800 Pure hypercholesterolemia, unspecified: Secondary | ICD-10-CM | POA: Diagnosis not present

## 2018-07-17 DIAGNOSIS — I10 Essential (primary) hypertension: Secondary | ICD-10-CM | POA: Diagnosis not present

## 2018-07-17 DIAGNOSIS — Z6831 Body mass index (BMI) 31.0-31.9, adult: Secondary | ICD-10-CM | POA: Diagnosis not present

## 2018-07-17 DIAGNOSIS — Z299 Encounter for prophylactic measures, unspecified: Secondary | ICD-10-CM | POA: Diagnosis not present

## 2018-07-17 DIAGNOSIS — I2510 Atherosclerotic heart disease of native coronary artery without angina pectoris: Secondary | ICD-10-CM | POA: Diagnosis not present

## 2018-07-17 DIAGNOSIS — E1165 Type 2 diabetes mellitus with hyperglycemia: Secondary | ICD-10-CM | POA: Diagnosis not present

## 2018-07-21 DIAGNOSIS — M19011 Primary osteoarthritis, right shoulder: Secondary | ICD-10-CM | POA: Diagnosis not present

## 2018-07-21 DIAGNOSIS — M25511 Pain in right shoulder: Secondary | ICD-10-CM | POA: Diagnosis not present

## 2018-07-22 ENCOUNTER — Telehealth: Payer: Self-pay

## 2018-07-28 DIAGNOSIS — M19011 Primary osteoarthritis, right shoulder: Secondary | ICD-10-CM | POA: Diagnosis not present

## 2018-07-28 DIAGNOSIS — M25511 Pain in right shoulder: Secondary | ICD-10-CM | POA: Diagnosis not present

## 2018-07-28 NOTE — Telephone Encounter (Signed)
Called pt to talk about repatha

## 2018-07-29 DIAGNOSIS — M75101 Unspecified rotator cuff tear or rupture of right shoulder, not specified as traumatic: Secondary | ICD-10-CM | POA: Diagnosis not present

## 2018-07-30 DIAGNOSIS — I2510 Atherosclerotic heart disease of native coronary artery without angina pectoris: Secondary | ICD-10-CM | POA: Diagnosis not present

## 2018-07-30 DIAGNOSIS — I10 Essential (primary) hypertension: Secondary | ICD-10-CM | POA: Diagnosis not present

## 2018-07-30 DIAGNOSIS — Z299 Encounter for prophylactic measures, unspecified: Secondary | ICD-10-CM | POA: Diagnosis not present

## 2018-07-30 DIAGNOSIS — E1165 Type 2 diabetes mellitus with hyperglycemia: Secondary | ICD-10-CM | POA: Diagnosis not present

## 2018-07-30 DIAGNOSIS — Z6831 Body mass index (BMI) 31.0-31.9, adult: Secondary | ICD-10-CM | POA: Diagnosis not present

## 2018-08-04 DIAGNOSIS — M19011 Primary osteoarthritis, right shoulder: Secondary | ICD-10-CM | POA: Diagnosis not present

## 2018-08-04 DIAGNOSIS — M25511 Pain in right shoulder: Secondary | ICD-10-CM | POA: Diagnosis not present

## 2018-08-11 DIAGNOSIS — M19011 Primary osteoarthritis, right shoulder: Secondary | ICD-10-CM | POA: Diagnosis not present

## 2018-08-11 DIAGNOSIS — M25511 Pain in right shoulder: Secondary | ICD-10-CM | POA: Diagnosis not present

## 2018-10-03 DIAGNOSIS — M75101 Unspecified rotator cuff tear or rupture of right shoulder, not specified as traumatic: Secondary | ICD-10-CM | POA: Diagnosis not present

## 2018-11-17 DIAGNOSIS — I2510 Atherosclerotic heart disease of native coronary artery without angina pectoris: Secondary | ICD-10-CM | POA: Diagnosis not present

## 2018-11-17 DIAGNOSIS — Z299 Encounter for prophylactic measures, unspecified: Secondary | ICD-10-CM | POA: Diagnosis not present

## 2018-11-17 DIAGNOSIS — I10 Essential (primary) hypertension: Secondary | ICD-10-CM | POA: Diagnosis not present

## 2018-11-17 DIAGNOSIS — Z6832 Body mass index (BMI) 32.0-32.9, adult: Secondary | ICD-10-CM | POA: Diagnosis not present

## 2018-11-17 DIAGNOSIS — E1165 Type 2 diabetes mellitus with hyperglycemia: Secondary | ICD-10-CM | POA: Diagnosis not present

## 2018-12-29 ENCOUNTER — Other Ambulatory Visit: Payer: Self-pay | Admitting: Cardiology

## 2018-12-29 DIAGNOSIS — I6523 Occlusion and stenosis of bilateral carotid arteries: Secondary | ICD-10-CM

## 2019-01-14 DIAGNOSIS — E7800 Pure hypercholesterolemia, unspecified: Secondary | ICD-10-CM | POA: Diagnosis not present

## 2019-01-14 DIAGNOSIS — Z1339 Encounter for screening examination for other mental health and behavioral disorders: Secondary | ICD-10-CM | POA: Diagnosis not present

## 2019-01-14 DIAGNOSIS — R5383 Other fatigue: Secondary | ICD-10-CM | POA: Diagnosis not present

## 2019-01-14 DIAGNOSIS — Z299 Encounter for prophylactic measures, unspecified: Secondary | ICD-10-CM | POA: Diagnosis not present

## 2019-01-14 DIAGNOSIS — Z1331 Encounter for screening for depression: Secondary | ICD-10-CM | POA: Diagnosis not present

## 2019-01-14 DIAGNOSIS — Z6832 Body mass index (BMI) 32.0-32.9, adult: Secondary | ICD-10-CM | POA: Diagnosis not present

## 2019-01-14 DIAGNOSIS — Z7189 Other specified counseling: Secondary | ICD-10-CM | POA: Diagnosis not present

## 2019-01-14 DIAGNOSIS — Z0000 Encounter for general adult medical examination without abnormal findings: Secondary | ICD-10-CM | POA: Diagnosis not present

## 2019-01-14 DIAGNOSIS — I10 Essential (primary) hypertension: Secondary | ICD-10-CM | POA: Diagnosis not present

## 2019-01-14 DIAGNOSIS — Z79899 Other long term (current) drug therapy: Secondary | ICD-10-CM | POA: Diagnosis not present

## 2019-01-26 DIAGNOSIS — Z0100 Encounter for examination of eyes and vision without abnormal findings: Secondary | ICD-10-CM | POA: Diagnosis not present

## 2019-02-13 DIAGNOSIS — Z0100 Encounter for examination of eyes and vision without abnormal findings: Secondary | ICD-10-CM | POA: Diagnosis not present

## 2019-02-24 DIAGNOSIS — Z6832 Body mass index (BMI) 32.0-32.9, adult: Secondary | ICD-10-CM | POA: Diagnosis not present

## 2019-02-24 DIAGNOSIS — Z299 Encounter for prophylactic measures, unspecified: Secondary | ICD-10-CM | POA: Diagnosis not present

## 2019-02-24 DIAGNOSIS — Z713 Dietary counseling and surveillance: Secondary | ICD-10-CM | POA: Diagnosis not present

## 2019-02-24 DIAGNOSIS — E1165 Type 2 diabetes mellitus with hyperglycemia: Secondary | ICD-10-CM | POA: Diagnosis not present

## 2019-02-24 DIAGNOSIS — I10 Essential (primary) hypertension: Secondary | ICD-10-CM | POA: Diagnosis not present

## 2019-04-20 ENCOUNTER — Other Ambulatory Visit: Payer: Self-pay

## 2019-04-20 ENCOUNTER — Ambulatory Visit (HOSPITAL_COMMUNITY)
Admission: RE | Admit: 2019-04-20 | Discharge: 2019-04-20 | Disposition: A | Discharge status: Home / Self Care | Payer: Medicare HMO | Source: Ambulatory Visit | Attending: Cardiology | Admitting: Cardiology

## 2019-04-20 DIAGNOSIS — I6523 Occlusion and stenosis of bilateral carotid arteries: Secondary | ICD-10-CM

## 2019-05-23 IMAGING — DX DG SHOULDER 2+V PORT*R*
1 series · 1 of 1 positions shown · non-contrast
Comparison: Preoperative MRI of April 30, 2018

CLINICAL DATA: Status post reverse right shoulder joint placement.

EXAM:
PORTABLE RIGHT SHOULDER

[shoulder ap]
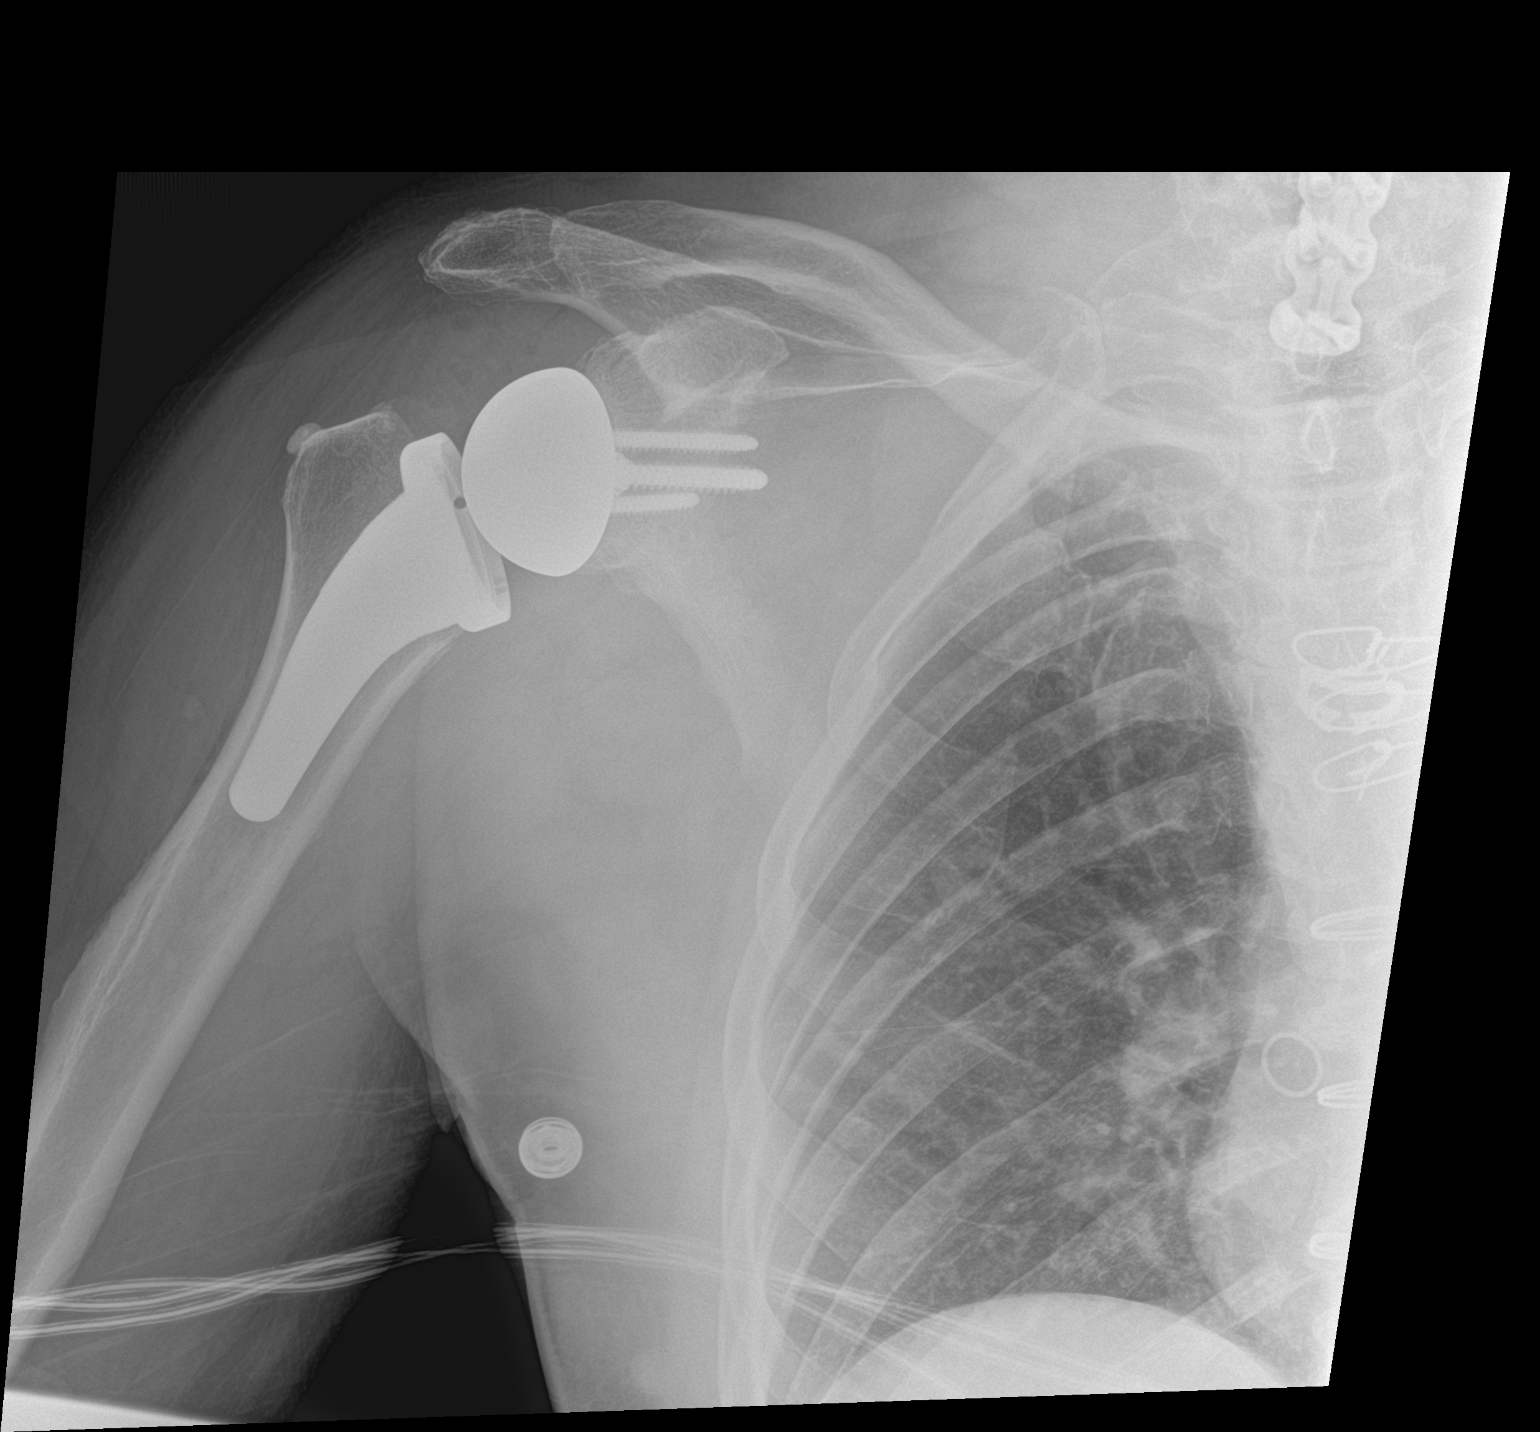

[1 of 1 positions shown; findings below may reference images not displayed]

FINDINGS: The patient has undergone placement of a reverse right shoulder
joint prosthesis. Radiographic positioning of the prosthetic
components is good. The interface with the native bone appears
normal.
IMPRESSION: There is no acute postprocedure complication following right
shoulder joint prosthesis placement.

## 2019-06-02 DIAGNOSIS — E1165 Type 2 diabetes mellitus with hyperglycemia: Secondary | ICD-10-CM | POA: Diagnosis not present

## 2019-06-02 DIAGNOSIS — Z713 Dietary counseling and surveillance: Secondary | ICD-10-CM | POA: Diagnosis not present

## 2019-06-02 DIAGNOSIS — I10 Essential (primary) hypertension: Secondary | ICD-10-CM | POA: Diagnosis not present

## 2019-06-02 DIAGNOSIS — Z299 Encounter for prophylactic measures, unspecified: Secondary | ICD-10-CM | POA: Diagnosis not present

## 2019-06-02 DIAGNOSIS — Z6832 Body mass index (BMI) 32.0-32.9, adult: Secondary | ICD-10-CM | POA: Diagnosis not present

## 2019-06-12 ENCOUNTER — Other Ambulatory Visit: Payer: Self-pay | Admitting: Pharmacist Clinician (PhC)/ Clinical Pharmacy Specialist

## 2019-06-12 ENCOUNTER — Other Ambulatory Visit: Payer: Self-pay | Admitting: Cardiology

## 2019-06-12 NOTE — Telephone Encounter (Signed)
° °*  STAT* If patient is at the pharmacy, call can be transferred to refill team.   1. Which medications need to be refilled? (please list name of each medication and dose if known)  Evolocumab (REPATHA SURECLICK) 102 MG/ML SOAJ  2. Which pharmacy/location (including street and city if local pharmacy) is medication to be sent to?[facility]   3. Do they need a 30 day or 90 day supply? 90 with refills  [facility] needs a new Rx for this patient. Their service uses their own pharmacy to mail the mediation to the patient. The fax number to [place] is [phone].  Pt is out of medication

## 2019-06-15 MED ORDER — REPATHA SURECLICK 140 MG/ML ~~LOC~~ SOAJ: 140.0000 mg | SUBCUTANEOUS | 3 refills | Status: DC

## 2019-06-15 NOTE — Telephone Encounter (Signed)
rx called to [facility]

## 2019-06-24 DIAGNOSIS — I10 Essential (primary) hypertension: Secondary | ICD-10-CM | POA: Diagnosis not present

## 2019-07-07 DIAGNOSIS — E7800 Pure hypercholesterolemia, unspecified: Secondary | ICD-10-CM | POA: Diagnosis not present

## 2019-07-13 DIAGNOSIS — E7800 Pure hypercholesterolemia, unspecified: Secondary | ICD-10-CM | POA: Diagnosis not present

## 2019-07-13 DIAGNOSIS — Z6831 Body mass index (BMI) 31.0-31.9, adult: Secondary | ICD-10-CM | POA: Diagnosis not present

## 2019-07-13 DIAGNOSIS — I10 Essential (primary) hypertension: Secondary | ICD-10-CM | POA: Diagnosis not present

## 2019-07-13 DIAGNOSIS — M545 Low back pain: Secondary | ICD-10-CM | POA: Diagnosis not present

## 2019-07-13 DIAGNOSIS — M109 Gout, unspecified: Secondary | ICD-10-CM | POA: Diagnosis not present

## 2019-07-13 DIAGNOSIS — R109 Unspecified abdominal pain: Secondary | ICD-10-CM | POA: Diagnosis not present

## 2019-07-13 DIAGNOSIS — M549 Dorsalgia, unspecified: Secondary | ICD-10-CM | POA: Diagnosis not present

## 2019-07-13 DIAGNOSIS — M47816 Spondylosis without myelopathy or radiculopathy, lumbar region: Secondary | ICD-10-CM | POA: Diagnosis not present

## 2019-07-13 DIAGNOSIS — Z299 Encounter for prophylactic measures, unspecified: Secondary | ICD-10-CM | POA: Diagnosis not present

## 2019-07-14 DIAGNOSIS — M47816 Spondylosis without myelopathy or radiculopathy, lumbar region: Secondary | ICD-10-CM | POA: Diagnosis not present

## 2019-07-14 DIAGNOSIS — R918 Other nonspecific abnormal finding of lung field: Secondary | ICD-10-CM | POA: Diagnosis not present

## 2019-07-14 DIAGNOSIS — R109 Unspecified abdominal pain: Secondary | ICD-10-CM | POA: Diagnosis not present

## 2019-07-14 DIAGNOSIS — I714 Abdominal aortic aneurysm, without rupture: Secondary | ICD-10-CM | POA: Diagnosis not present

## 2019-07-14 DIAGNOSIS — I700 Atherosclerosis of aorta: Secondary | ICD-10-CM | POA: Diagnosis not present

## 2019-07-16 DIAGNOSIS — Z6831 Body mass index (BMI) 31.0-31.9, adult: Secondary | ICD-10-CM | POA: Diagnosis not present

## 2019-07-16 DIAGNOSIS — R911 Solitary pulmonary nodule: Secondary | ICD-10-CM | POA: Diagnosis not present

## 2019-07-16 DIAGNOSIS — Z299 Encounter for prophylactic measures, unspecified: Secondary | ICD-10-CM | POA: Diagnosis not present

## 2019-07-16 DIAGNOSIS — I2510 Atherosclerotic heart disease of native coronary artery without angina pectoris: Secondary | ICD-10-CM | POA: Diagnosis not present

## 2019-07-16 DIAGNOSIS — I714 Abdominal aortic aneurysm, without rupture: Secondary | ICD-10-CM | POA: Diagnosis not present

## 2019-07-16 DIAGNOSIS — E1165 Type 2 diabetes mellitus with hyperglycemia: Secondary | ICD-10-CM | POA: Diagnosis not present

## 2019-07-16 DIAGNOSIS — I10 Essential (primary) hypertension: Secondary | ICD-10-CM | POA: Diagnosis not present

## 2019-07-17 ENCOUNTER — Other Ambulatory Visit (HOSPITAL_COMMUNITY): Payer: Self-pay | Admitting: Cardiology

## 2019-07-17 DIAGNOSIS — I6523 Occlusion and stenosis of bilateral carotid arteries: Secondary | ICD-10-CM

## 2019-07-21 DIAGNOSIS — Z6831 Body mass index (BMI) 31.0-31.9, adult: Secondary | ICD-10-CM | POA: Diagnosis not present

## 2019-07-21 DIAGNOSIS — I714 Abdominal aortic aneurysm, without rupture: Secondary | ICD-10-CM | POA: Diagnosis not present

## 2019-07-21 DIAGNOSIS — E1165 Type 2 diabetes mellitus with hyperglycemia: Secondary | ICD-10-CM | POA: Diagnosis not present

## 2019-07-21 DIAGNOSIS — I2510 Atherosclerotic heart disease of native coronary artery without angina pectoris: Secondary | ICD-10-CM | POA: Diagnosis not present

## 2019-07-21 DIAGNOSIS — U071 COVID-19: Secondary | ICD-10-CM | POA: Diagnosis not present

## 2019-07-21 DIAGNOSIS — Z299 Encounter for prophylactic measures, unspecified: Secondary | ICD-10-CM | POA: Diagnosis not present

## 2019-07-22 ENCOUNTER — Encounter: Payer: Medicare HMO | Admitting: Vascular Surgery

## 2019-07-22 DIAGNOSIS — U071 COVID-19: Secondary | ICD-10-CM | POA: Diagnosis not present

## 2019-07-22 DIAGNOSIS — Z6831 Body mass index (BMI) 31.0-31.9, adult: Secondary | ICD-10-CM | POA: Diagnosis not present

## 2019-07-22 DIAGNOSIS — Z299 Encounter for prophylactic measures, unspecified: Secondary | ICD-10-CM | POA: Diagnosis not present

## 2019-07-22 DIAGNOSIS — Z713 Dietary counseling and surveillance: Secondary | ICD-10-CM | POA: Diagnosis not present

## 2019-08-10 ENCOUNTER — Telehealth: Payer: Self-pay

## 2019-08-10 NOTE — Telephone Encounter (Signed)
Called and spoke w/pt about applying for healthwell since they were denied from snf and the pt voiced understanding

## 2019-08-11 ENCOUNTER — Telehealth: Payer: Self-pay | Admitting: Pharmacist

## 2019-08-11 NOTE — Telephone Encounter (Signed)
APPLIED FOR THE GRANT AND RECEIVED $2500. The pt stated they have plenty of the med for right now and will call us when they get closer to running out

## 2019-08-11 NOTE — Telephone Encounter (Signed)
Tony [name] will like to talk to you. He called [employer] and is unable to reach anyone.  Please call him back to troubleshoot.

## 2019-08-12 DIAGNOSIS — I10 Essential (primary) hypertension: Secondary | ICD-10-CM | POA: Diagnosis not present

## 2019-08-12 DIAGNOSIS — I2510 Atherosclerotic heart disease of native coronary artery without angina pectoris: Secondary | ICD-10-CM | POA: Diagnosis not present

## 2019-08-12 DIAGNOSIS — Z299 Encounter for prophylactic measures, unspecified: Secondary | ICD-10-CM | POA: Diagnosis not present

## 2019-08-12 DIAGNOSIS — M25561 Pain in right knee: Secondary | ICD-10-CM | POA: Diagnosis not present

## 2019-08-12 DIAGNOSIS — I714 Abdominal aortic aneurysm, without rupture: Secondary | ICD-10-CM | POA: Diagnosis not present

## 2019-08-12 DIAGNOSIS — E1165 Type 2 diabetes mellitus with hyperglycemia: Secondary | ICD-10-CM | POA: Diagnosis not present

## 2019-08-12 DIAGNOSIS — Z6829 Body mass index (BMI) 29.0-29.9, adult: Secondary | ICD-10-CM | POA: Diagnosis not present

## 2019-08-26 ENCOUNTER — Telehealth: Payer: Self-pay

## 2019-08-26 MED ORDER — REPATHA SURECLICK 140 MG/ML ~~LOC~~ SOAJ: 140.0000 mg | SUBCUTANEOUS | 11 refills | Status: DC

## 2019-08-26 NOTE — Telephone Encounter (Signed)
Pt called and stated that they received the pharmacy card from [place] so rx sent and instructed the pt to take the card with them to pick it up. Pt voiced understanding

## 2019-08-30 DIAGNOSIS — Z7189 Other specified counseling: Secondary | ICD-10-CM | POA: Insufficient documentation

## 2019-08-30 NOTE — Progress Notes (Signed)
Cardiology Office Note   Date:  09/02/2019   ID:  Tony [name], DOB Jan 17, 1941, MRN [id number]  PCP:  Ignatius Specking, MD  Cardiologist:   [name], MD   Chief Complaint  Patient presents with  . Coronary Artery Disease      History of Present Illness: Tony [name] is a 79 y.o. male who presents for followup after CABG in June of 2014.  Since I last saw him he had shoulder surgery.  He is also found to have an aortic aneurysm.  This was picked up in the fall when he had some pain.  He was going to have it repaired but tested Covid positive.  This was in November.  He had no symptoms with this.  He is now scheduled to have endovascular repair this afternoon.  All of the imaging was apparently done at [facility] and I do not have these records.  He is being treated by Dr. Edilia Bo.    He has noticed that his blood pressure is running  up and he is starting to take an occasional lisinopril occasionally.  He was on metoprolol at one point but he has not had renewals on this in forever.  The patient denies any new symptoms such as chest discomfort, neck or arm discomfort. There has been no new shortness of breath, PND or orthopnea. There have been no reported palpitations, presyncope or syncope.  He said he did very well with his shoulder surgery.  Past Medical History:  Diagnosis Date  . Anginal pain (HCC)    2014  . Arthritis   . Carotid artery disease (HCC)    left ICA occlusion  . Coronary artery disease   . Diabetes mellitus Age 19   Type 2  . Gout   . Hyperlipidemia   . Hypertension   . Joint pain   . Leg pain   . PVD (peripheral vascular disease) (HCC)    SFA 50%, stenosis right profunda    Past Surgical History:  Procedure Laterality Date  . BACK SURGERY  2010   lumbar  . CERVICAL SPINE SURGERY  2010  . CORONARY ARTERY BYPASS GRAFT N/A 02/09/2013   Procedure: CORONARY ARTERY BYPASS GRAFTING (CABG);  Surgeon: Kerin Perna, MD;  Location: [facility] OR;   Service: Open Heart Surgery;  Laterality: N/A;  LIMA to the LAD, SVG to right coronary artery, sequential SVG to OM and distal circumflex.  . INTRAOPERATIVE TRANSESOPHAGEAL ECHOCARDIOGRAM N/A 02/09/2013   Procedure: INTRAOPERATIVE TRANSESOPHAGEAL ECHOCARDIOGRAM;  Surgeon: Kerin Perna, MD;  Location: [facility] OR;  Service: Open Heart Surgery;  Laterality: N/A;  . REVERSE SHOULDER ARTHROPLASTY Right 06/25/2018   Procedure: RIGHT REVERSE SHOULDER ARTHROPLASTY;  Surgeon: Bjorn Pippin, MD;  Location: [facility] OR;  Service: Orthopedics;  Laterality: Right;  . REVERSE TOTAL SHOULDER ARTHROPLASTY Right 06/25/2018     Current Outpatient Medications  Medication Sig Dispense Refill  . benazepril (LOTENSIN) 10 MG tablet Take 10 mg by mouth at bedtime.     . Evolocumab (REPATHA SURECLICK) 140 MG/ML SOAJ Inject 140 mg into the skin every 14 (fourteen) days. 2 pen 11  . glipiZIDE (GLUCOTROL) 5 MG tablet Take 5 mg by mouth 2 (two) times daily.     Marland Kitchen lisinopril (ZESTRIL) 10 MG tablet Take 1 tablet (10 mg total) by mouth 2 (two) times daily. 180 tablet 3   No current facility-administered medications for this visit.    Allergies:   Aspirin    ROS:  Please see the history of present illness.   Otherwise, review of systems are positive for none.   All other systems are reviewed and negative.    PHYSICAL EXAM: VS:  BP (!) 158/90   Pulse 68   Ht 5\' 11"  (1.803 m)   Wt 219 lb (99.3 kg)   BMI 30.54 kg/m  , BMI Body mass index is 30.54 kg/m. GENERAL:  Well appearing NECK:  No jugular venous distention, waveform within normal limits, carotid upstroke brisk and symmetric, no bruits, no thyromegaly LUNGS:  Clear to auscultation bilaterally CHEST:  Well healed sternotomy scar. HEART:  PMI not displaced or sustained,S1 and S2 within normal limits, no S3, no S4, no clicks, no rubs, soft apical systolic murmur radiating slightly up aortic outflow tract and early peaking, no diastolic murmurs ABD:  Flat, positive bowel  sounds normal in frequency in pitch, no bruits, no rebound, no guarding, no midline pulsatile mass, no hepatomegaly, no splenomegaly EXT:  2 plus pulses throughout, no edema, no cyanosis no clubbing   EKG:  EKG is ordered today. The ekg ordered today demonstrates sinus rhythm, rate 68, axis within normal limits, intervals within normal limits, poor anterior R wave progression, old anteroseptal infarct, nonspecific lateral T wave changes.   Recent Labs: No results found for requested labs within last 8760 hours.    Lipid Panel    Component Value Date/Time   CHOL 210 (H) 02/06/2013 0425   TRIG 377 (H) 02/06/2013 0425   HDL 32 (L) 02/06/2013 0425   CHOLHDL 6.6 02/06/2013 0425   VLDL 75 (H) 02/06/2013 0425   LDLCALC 103 (H) 02/06/2013 0425      Wt Readings from Last 3 Encounters:  09/02/19 219 lb (99.3 kg)  06/25/18 229 lb 1.6 oz (103.9 kg)  06/16/18 229 lb 1.6 oz (103.9 kg)      Other studies Reviewed: Additional studies/ records that were reviewed today include: Labs. Review of the above records demonstrates:  Please see elsewhere in the note.     ASSESSMENT AND PLAN:   CAD/CABG:   The patient has had no chest pain.  He is going to continue with aggressive risk reduction.  HTN:   BP is elevated.  I am going to increase his lisinopril to 10 mg twice daily.  He will keep a blood pressure diary and if he is not in the 884Z or lower systolic I will add amlodipine.   CAROTID STENOSIS:  He has 100% chronic occlusion of the left carotid and mild plaque on the right in August of this year.  I asked him to arrange follow-up with vascular surgery since he will be followed in our office and if not we can follow this annually in our office.   AAA: He is due to have this repaired today.  HYPERLIPIDEMIA:       He is intolerant of statin however, he did very well with PCSK9 inhibitor.  His LDL is down to 64.   DM:    A1C was in the low sixes despite his last report.  He lost 14  pounds.   COVID 19 EDUCATION: At this point since he had Covid he is not going to get the vaccine.  Current medicines are reviewed at length with the patient today.  The patient does not have concerns regarding medicines.  The following changes have been made:  As above.   Labs/ tests ordered today include: None  Orders Placed This Encounter  Procedures  . EKG 12-Lead  Disposition:   FU with me in 12 month.     Signed, [name], MD  09/02/2019 11:34 AM    [facility]

## 2019-09-01 ENCOUNTER — Telehealth: Payer: Self-pay | Admitting: Cardiology

## 2019-09-01 NOTE — Telephone Encounter (Signed)

## 2019-09-02 ENCOUNTER — Encounter: Payer: Self-pay | Admitting: Vascular Surgery

## 2019-09-02 ENCOUNTER — Ambulatory Visit (INDEPENDENT_AMBULATORY_CARE_PROVIDER_SITE_OTHER): Payer: Medicare HMO | Admitting: Vascular Surgery

## 2019-09-02 ENCOUNTER — Other Ambulatory Visit: Payer: Self-pay

## 2019-09-02 ENCOUNTER — Encounter: Payer: Self-pay | Admitting: Cardiology

## 2019-09-02 ENCOUNTER — Ambulatory Visit (INDEPENDENT_AMBULATORY_CARE_PROVIDER_SITE_OTHER): Payer: Medicare HMO | Admitting: Cardiology

## 2019-09-02 VITALS — BP 158/90 | HR 68 | Ht 71.0 in | Wt 219.0 lb

## 2019-09-02 VITALS — BP 137/86 | HR 71 | Temp 98.3°F | Resp 20 | Ht 71.0 in | Wt 218.0 lb

## 2019-09-02 DIAGNOSIS — I714 Abdominal aortic aneurysm, without rupture, unspecified: Secondary | ICD-10-CM

## 2019-09-02 DIAGNOSIS — E785 Hyperlipidemia, unspecified: Secondary | ICD-10-CM | POA: Diagnosis not present

## 2019-09-02 DIAGNOSIS — E118 Type 2 diabetes mellitus with unspecified complications: Secondary | ICD-10-CM

## 2019-09-02 DIAGNOSIS — I6523 Occlusion and stenosis of bilateral carotid arteries: Secondary | ICD-10-CM

## 2019-09-02 DIAGNOSIS — Z7189 Other specified counseling: Secondary | ICD-10-CM

## 2019-09-02 DIAGNOSIS — I2510 Atherosclerotic heart disease of native coronary artery without angina pectoris: Secondary | ICD-10-CM | POA: Diagnosis not present

## 2019-09-02 DIAGNOSIS — I10 Essential (primary) hypertension: Secondary | ICD-10-CM | POA: Diagnosis not present

## 2019-09-02 MED ORDER — LISINOPRIL 10 MG PO TABS: 10.0000 mg | ORAL_TABLET | Freq: Two times a day (BID) | ORAL | 3 refills | Status: DC

## 2019-09-02 NOTE — Progress Notes (Signed)
REASON FOR CONSULT:    Abdominal aortic aneurysm.  The patient is referred by Dr. Sherril Croon.  ASSESSMENT & PLAN:   5.2 CM INFRARENAL ABDOMINAL AORTIC ANEURYSM: This patient has an asymptomatic 5.2 cm infrarenal abdominal aortic aneurysm.  I have explained that in a normal risk patient we would consider elective repair at 5.5 cm.  Based on his scan is not clear whether or not he be a candidate for an endovascular approach if this did continue to enlarge.  I have ordered a follow-up CT angiogram with 1 mm cuts in 6 months from his last study and I will see him back at that time.  That will be in May 2021.  We have discussed the risks of abdominal aortic aneurysms and the risks of repair.  He understands given the size of the aneurysm the risk of repair at this point is larger than the risk of rupture.  Fortunately he is not a smoker.  His blood pressures under good control.  BILATERAL CAROTID DISEASE: The patient has a known left ICA occlusion.  He has no significant stenosis on the right.  He is asymptomatic.  He does not tolerate aspirin as he has problems with gout when on aspirin.  His cholesterol is under good control.  We have been asked to follow his carotid disease so he will be due for a yearly duplex in August 2021 and I have scheduled the study.   [name], MD Office: [phone]   HPI:   Tony [name] is a pleasant 79 y.o. male, who was referred with an abdominal aortic aneurysm.  In November of last year he was having back pain.  This prompted a CT scan and an incidental finding was an abdominal aortic aneurysm.  He was scheduled to be seen in November but apparently was Covid positive so this visit was delayed and now he is coming in to discuss the findings on his CT scan from November.  He had injured his back while lifting his riding mower which prompted the CT scan.  However that back pain resolved quickly and has had no further abdominal pain or back pain.  He has no family  history of aneurysmal disease that he is aware of.  He quit smoking in 1993.  His blood pressure has been under good control.  He does have a history of carotid disease and has a known left internal carotid artery occlusion.  His most recent carotid duplex scan on 04/20/2019 showed a less than 39% right carotid stenosis.  He denies any history of stroke, TIAs, expressive or receptive aphasia, or amaurosis fugax.  Past Medical History:  Diagnosis Date  . Anginal pain (HCC)    2014  . Arthritis   . Carotid artery disease (HCC)    left ICA occlusion  . Coronary artery disease   . Diabetes mellitus Age 85   Type 2  . Gout   . Hyperlipidemia   . Hypertension   . Joint pain   . Leg pain   . PVD (peripheral vascular disease) (HCC)    SFA 50%, stenosis right profunda    Family History  Problem Relation Age of Onset  . Heart failure Mother 29  . Cancer Sister     SOCIAL HISTORY: Social History   Socioeconomic History  . Marital status: Married    Spouse name: Not on file  . Number of children: Not on file  . Years of education: Not on file  . Highest education  level: Not on file  Occupational History  . Not on file  Tobacco Use  . Smoking status: Former Smoker    Packs/day: 3.00    Years: 40.00    Pack years: 120.00    Types: Cigarettes    Quit date: 05/27/1992    Years since quitting: 27.2  . Smokeless tobacco: Never Used  Substance and Sexual Activity  . Alcohol use: No  . Drug use: No  . Sexual activity: Not on file  Other Topics Concern  . Not on file  Social History Narrative   Lives at home with wife.          Social Determinants of Health   Financial Resource Strain:   . Difficulty of Paying Living Expenses: Not on file  Food Insecurity:   . Worried About Programme researcher, broadcasting/film/video in the Last Year: Not on file  . Ran Out of Food in the Last Year: Not on file  Transportation Needs:   . Lack of Transportation (Medical): Not on file  . Lack of Transportation  (Non-Medical): Not on file  Physical Activity:   . Days of Exercise per Week: Not on file  . Minutes of Exercise per Session: Not on file  Stress:   . Feeling of Stress : Not on file  Social Connections:   . Frequency of Communication with Friends and Family: Not on file  . Frequency of Social Gatherings with Friends and Family: Not on file  . Attends Religious Services: Not on file  . Active Member of Clubs or Organizations: Not on file  . Attends Banker Meetings: Not on file  . Marital Status: Not on file  Intimate Partner Violence:   . Fear of Current or Ex-Partner: Not on file  . Emotionally Abused: Not on file  . Physically Abused: Not on file  . Sexually Abused: Not on file    Allergies  Allergen Reactions  . Aspirin Other (See Comments)    Causes gout flare    Current Outpatient Medications  Medication Sig Dispense Refill  . benazepril (LOTENSIN) 10 MG tablet Take 10 mg by mouth at bedtime.     . Evolocumab (REPATHA SURECLICK) 140 MG/ML SOAJ Inject 140 mg into the skin every 14 (fourteen) days. 2 pen 11  . glipiZIDE (GLUCOTROL) 5 MG tablet Take 5 mg by mouth 2 (two) times daily.     Marland Kitchen lisinopril (ZESTRIL) 10 MG tablet Take 1 tablet (10 mg total) by mouth 2 (two) times daily. 180 tablet 3   No current facility-administered medications for this visit.    REVIEW OF SYSTEMS:  [X]  denotes positive finding, [ ]  denotes negative finding Cardiac  Comments:  Chest pain or chest pressure:    Shortness of breath upon exertion:    Short of breath when lying flat:    Irregular heart rhythm:        Vascular    Pain in calf, thigh, or hip brought on by ambulation:    Pain in feet at night that wakes you up from your sleep:     Blood clot in your veins:    Leg swelling:         Pulmonary    Oxygen at home:    Productive cough:     Wheezing:         Neurologic    Sudden weakness in arms or legs:     Sudden numbness in arms or legs:     Sudden onset of  difficulty speaking or slurred speech:    Temporary loss of vision in one eye:     Problems with dizziness:         Gastrointestinal    Blood in stool:     Vomited blood:         Genitourinary    Burning when urinating:     Blood in urine:        Psychiatric    Major depression:         Hematologic    Bleeding problems:    Problems with blood clotting too easily:        Skin    Rashes or ulcers:        Constitutional    Fever or chills:     PHYSICAL EXAM:   Vitals:   09/02/19 1328  BP: 137/86  Pulse: 71  Resp: 20  Temp: 98.3 F (36.8 C)  SpO2: 98%  Weight: 218 lb (98.9 kg)  Height: 5\' 11"  (1.803 m)    GENERAL: The patient is a well-nourished male, in no acute distress. The vital signs are documented above. CARDIAC: There is a regular rate and rhythm.  VASCULAR: I do not detect carotid bruits. He has palpable radial pulses bilaterally. He has palpable femoral pulses bilaterally. He has monophasic with brisk Doppler signals in the right foot in the dorsalis pedis and posterior tibial position. He has a palpable left dorsalis pedis pulse.  He has a monophasic but brisk posterior tibial signal on the left with the Doppler. He has no significant lower extremity swelling. PULMONARY: There is good air exchange bilaterally without wheezing or rales. ABDOMEN: Soft and non-tender with normal pitched bowel sounds.  His aneurysm is palpable and nontender. MUSCULOSKELETAL: There are no major deformities or cyanosis. NEUROLOGIC: No focal weakness or paresthesias are detected. SKIN: There are no ulcers or rashes noted. PSYCHIATRIC: The patient has a normal affect.  DATA:    CT ABDOMEN PELVIS: I reviewed his CT of the abdomen and pelvis that was done in November 2020.  This shows a 5.2 cm infrarenal abdominal aortic aneurysm.  He was concerned that the aneurysm was 5.8 cm but I explained to him that with the length of the aneurysm.  His iliac arteries were not involved.  His  neck below the renals was fairly short and there was some laminated thrombus so based on this scan is not clear whether or not he would be a candidate for an endovascular approach.  LABS: Most recent labs that I have are from October 2019.  At that time his GFR was greater than 60.  CAROTID DUPLEX: I reviewed the carotid duplex scan from August 2020.  He has a known left internal carotid artery occlusion.  The right internal carotid artery had a less than 39% stenosis.

## 2019-09-02 NOTE — Patient Instructions (Addendum)
Medication Instructions:  Please increase Lisinopril to 10 mg twice a day.  Continue all other medications as listed.  *If you need a refill on your cardiac medications before your next appointment, please call your pharmacy*  Follow-Up: At [facility], you and your health needs are our priority.  As part of our continuing mission to provide you with exceptional heart care, we have created designated Provider Care Teams.  These Care Teams include your primary Cardiologist (physician) and Advanced Practice Providers (APPs -  Physician Assistants and Nurse Practitioners) who all work together to provide you with the care you need, when you need it.  Your next appointment:   12 month(s)  The format for your next appointment:   In Person  Provider:   [name], MD  Thank you for choosing [facility]!!

## 2019-09-03 ENCOUNTER — Other Ambulatory Visit: Payer: Self-pay | Admitting: Cardiology

## 2019-09-07 ENCOUNTER — Other Ambulatory Visit: Payer: Self-pay | Admitting: *Deleted

## 2019-09-07 DIAGNOSIS — I6523 Occlusion and stenosis of bilateral carotid arteries: Secondary | ICD-10-CM

## 2019-10-06 DIAGNOSIS — I714 Abdominal aortic aneurysm, without rupture: Secondary | ICD-10-CM | POA: Diagnosis not present

## 2019-10-06 DIAGNOSIS — Z299 Encounter for prophylactic measures, unspecified: Secondary | ICD-10-CM | POA: Diagnosis not present

## 2019-10-06 DIAGNOSIS — E1165 Type 2 diabetes mellitus with hyperglycemia: Secondary | ICD-10-CM | POA: Diagnosis not present

## 2019-10-06 DIAGNOSIS — I10 Essential (primary) hypertension: Secondary | ICD-10-CM | POA: Diagnosis not present

## 2019-10-06 DIAGNOSIS — R911 Solitary pulmonary nodule: Secondary | ICD-10-CM | POA: Diagnosis not present

## 2019-10-06 DIAGNOSIS — Z87891 Personal history of nicotine dependence: Secondary | ICD-10-CM | POA: Diagnosis not present

## 2019-10-06 DIAGNOSIS — Z6831 Body mass index (BMI) 31.0-31.9, adult: Secondary | ICD-10-CM | POA: Diagnosis not present

## 2019-10-12 ENCOUNTER — Telehealth: Payer: Self-pay

## 2019-10-12 MED ORDER — PRALUENT 150 MG/ML ~~LOC~~ SOAJ: 150.0000 mg | SUBCUTANEOUS | 11 refills | Status: DC

## 2019-10-12 NOTE — Telephone Encounter (Signed)
Called the pt to let them know that the formulary switched TO PRALUENT. Pa submitted will await and answer

## 2019-10-12 NOTE — Addendum Note (Signed)
Addended by: Eather Colas on: 10/12/2019 02:55 PM   Modules accepted: Orders

## 2019-10-12 NOTE — Telephone Encounter (Signed)
Called and spoke w/pt regarding the approval of the praluent and the pt voiced understanding. Instructed the pt to call the healthwell [employer] and let them know that they are switching from repatha to praluent and also told him to call back if they have any issues.

## 2019-10-13 DIAGNOSIS — M7751 Other enthesopathy of right foot: Secondary | ICD-10-CM | POA: Diagnosis not present

## 2019-12-10 ENCOUNTER — Other Ambulatory Visit: Payer: Self-pay

## 2019-12-10 DIAGNOSIS — I6523 Occlusion and stenosis of bilateral carotid arteries: Secondary | ICD-10-CM

## 2019-12-10 DIAGNOSIS — I714 Abdominal aortic aneurysm, without rupture, unspecified: Secondary | ICD-10-CM

## 2020-01-11 ENCOUNTER — Ambulatory Visit
Admission: RE | Admit: 2020-01-11 | Discharge: 2020-01-11 | Disposition: A | Discharge status: Home / Self Care | Payer: Medicare HMO | Source: Ambulatory Visit | Attending: Vascular Surgery | Admitting: Vascular Surgery

## 2020-01-11 DIAGNOSIS — I714 Abdominal aortic aneurysm, without rupture, unspecified: Secondary | ICD-10-CM

## 2020-01-11 MED ORDER — IOPAMIDOL (ISOVUE-370) INJECTION 76%
75.0000 mL | Freq: Once | INTRAVENOUS | Status: AC | PRN
  Administered 2020-01-11: 75 mL via INTRAVENOUS

## 2020-01-15 DIAGNOSIS — Z299 Encounter for prophylactic measures, unspecified: Secondary | ICD-10-CM | POA: Diagnosis not present

## 2020-01-15 DIAGNOSIS — E7800 Pure hypercholesterolemia, unspecified: Secondary | ICD-10-CM | POA: Diagnosis not present

## 2020-01-15 DIAGNOSIS — Z6831 Body mass index (BMI) 31.0-31.9, adult: Secondary | ICD-10-CM | POA: Diagnosis not present

## 2020-01-15 DIAGNOSIS — E1165 Type 2 diabetes mellitus with hyperglycemia: Secondary | ICD-10-CM | POA: Diagnosis not present

## 2020-01-15 DIAGNOSIS — I714 Abdominal aortic aneurysm, without rupture: Secondary | ICD-10-CM | POA: Diagnosis not present

## 2020-01-15 DIAGNOSIS — I10 Essential (primary) hypertension: Secondary | ICD-10-CM | POA: Diagnosis not present

## 2020-01-20 ENCOUNTER — Encounter: Payer: Self-pay | Admitting: Vascular Surgery

## 2020-01-20 ENCOUNTER — Ambulatory Visit (INDEPENDENT_AMBULATORY_CARE_PROVIDER_SITE_OTHER): Payer: Medicare HMO | Admitting: Vascular Surgery

## 2020-01-20 ENCOUNTER — Other Ambulatory Visit: Payer: Self-pay

## 2020-01-20 VITALS — BP 145/79 | HR 71 | Resp 20 | Ht 71.0 in | Wt 220.0 lb

## 2020-01-20 DIAGNOSIS — I714 Abdominal aortic aneurysm, without rupture, unspecified: Secondary | ICD-10-CM

## 2020-01-20 DIAGNOSIS — I6523 Occlusion and stenosis of bilateral carotid arteries: Secondary | ICD-10-CM | POA: Diagnosis not present

## 2020-01-20 NOTE — Progress Notes (Signed)
REASON FOR VISIT:   Follow-up of abdominal aortic aneurysm  MEDICAL ISSUES:   ABDOMINAL AORTIC ANEURYSM: His aneurysm has enlarged to 5.6 cm.  His risk of rupture is 5 to 10 %/year.  This reason I think we should consider elective repair.  Based on his CT angiogram I think he is probably a candidate for endovascular repair however I would like to review these films with my partners.  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  In addition he has some thrombus at the neck.  He also have some calcific plaque in the left common femoral artery especially and would likely need to do a cutdown as opposed to a percutaneous approach for this.  I think he would be at increased risk for open repair given his age.  We will obtain preoperative cardiac clearance with Dr. Percival Spanish.   BILATERAL CAROTID DISEASE: Patient has a known left internal carotid artery occlusion.  There was no significant disease on the right on the last follow-up study and he was due for a yearly duplex scan in August 2021.   HPI:   Tony [name] is a pleasant 79 y.o. male who I last saw on 09/02/2019.  At that time he had an asymptomatic 5.2 cm infrarenal abdominal aortic aneurysm.  He was due for a 28-month follow-up visit at this time and comes in after having had a CT scan.  He denies any abdominal pain or back pain.  Of note he also has known carotid disease.  He has a known left internal carotid artery occlusion.  He is due for a yearly duplex scan in August 2021.  He denies any history of stroke, TIAs, expressive or receptive aphasia, or amaurosis fugax.  He denies any history of previous myocardial infarction or history of congestive heart failure.  He has undergone previous coronary revascularization however.  He is followed by Dr. Percival Spanish.  Has had no recent chest pain or significant shortness of breath.  He quit smoking in 1993 but previously smoked 3 packs a day for 40 years.  His other  risk factors for peripheral vascular disease include type 2 diabetes, hypertension, and hypercholesterolemia.  He is on a statin but states that he cannot take aspirin because every time he does he gets gout in his right great toe.  Thus he does not tolerate aspirin.  Past Medical History:  Diagnosis Date  . Anginal pain ([place])    2014  . Arthritis   . Carotid artery disease ([place])    left ICA occlusion  . Coronary artery disease   . Diabetes mellitus Age 66   Type 2  . Gout   . Hyperlipidemia   . Hypertension   . Joint pain   . Leg pain   . PVD (peripheral vascular disease) (HCC)    SFA 50%, stenosis right profunda    Family History  Problem Relation Age of Onset  . Heart failure Mother 69  . Cancer Sister     SOCIAL HISTORY: Social History   Tobacco Use  . Smoking status: Former Smoker    Packs/day: 3.00    Years: 40.00    Pack years: 120.00    Types: Cigarettes    Quit date: 05/27/1992    Years since quitting: 27.6  . Smokeless tobacco: Never Used  Substance Use Topics  . Alcohol use: No    Allergies  Allergen Reactions  . Aspirin Other (See Comments)  Causes gout flare    Current Outpatient Medications  Medication Sig Dispense Refill  . Alirocumab (PRALUENT) 150 MG/ML SOAJ Inject 150 mg into the skin every 14 (fourteen) days. 2 pen 11  . benazepril (LOTENSIN) 10 MG tablet Take 10 mg by mouth at bedtime.     . glipiZIDE (GLUCOTROL) 5 MG tablet Take 5 mg by mouth 2 (two) times daily.      No current facility-administered medications for this visit.    REVIEW OF SYSTEMS:  [X] denotes positive finding, [ ] denotes negative finding Cardiac  Comments:  Chest pain or chest pressure:    Shortness of breath upon exertion:    Short of breath when lying flat:    Irregular heart rhythm:        Vascular    Pain in calf, thigh, or hip brought on by ambulation:    Pain in feet at night that wakes you up from your sleep:     Blood clot in your veins:    Leg  swelling:         Pulmonary    Oxygen at home:    Productive cough:     Wheezing:         Neurologic    Sudden weakness in arms or legs:     Sudden numbness in arms or legs:     Sudden onset of difficulty speaking or slurred speech:    Temporary loss of vision in one eye:     Problems with dizziness:         Gastrointestinal    Blood in stool:     Vomited blood:         Genitourinary    Burning when urinating:     Blood in urine:        Psychiatric    Major depression:         Hematologic    Bleeding problems:    Problems with blood clotting too easily:        Skin    Rashes or ulcers:        Constitutional    Fever or chills:     PHYSICAL EXAM:   Vitals:   01/20/20 1246  BP: (!) 145/79  Pulse: 71  Resp: 20  SpO2: 97%  Weight: 220 lb (99.8 kg)  Height: 5' 11" (1.803 m)    GENERAL: The patient is a well-nourished male, in no acute distress. The vital signs are documented above. CARDIAC: There is a regular rate and rhythm.  VASCULAR: I do not detect carotid disease. He has palpable femoral pulses. On the right side I cannot palpate pedal pulses.  He has a monophasic dorsalis pedis and posterior tibial signal. On the left side, he has a biphasic dorsalis pedis signal with a monophasic posterior tibial signal. PULMONARY: There is good air exchange bilaterally without wheezing or rales. ABDOMEN: Soft and non-tender with normal pitched bowel sounds.  His aneurysm is palpable and nontender.  MUSCULOSKELETAL: There are no major deformities or cyanosis. NEUROLOGIC: No focal weakness or paresthesias are detected. SKIN: There are no ulcers or rashes noted. PSYCHIATRIC: The patient has a normal affect.  DATA:    CT ANGIOGRAM ABDOMEN AND PELVIS: I have reviewed the images of the CT angiogram of the abdomen and pelvis.  His aneurysm has enlarged to 5.6 cm.  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  He   also has some significant calcific disease especially in the left common femoral artery and this would likely require an open approach for access if we did an EVAR.  [name] [facility] [phone]

## 2020-01-20 NOTE — H&P (View-Only) (Signed)
REASON FOR VISIT:   Follow-up of abdominal aortic aneurysm  MEDICAL ISSUES:   ABDOMINAL AORTIC ANEURYSM: His aneurysm has enlarged to 5.6 cm.  His risk of rupture is 5 to 10 %/year.  This reason I think we should consider elective repair.  Based on his CT angiogram I think he is probably a candidate for endovascular repair however I would like to review these films with my partners.  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  In addition he has some thrombus at the neck.  He also have some calcific plaque in the left common femoral artery especially and would likely need to do a cutdown as opposed to a percutaneous approach for this.  I think he would be at increased risk for open repair given his age.  We will obtain preoperative cardiac clearance with Dr. Percival Spanish.   BILATERAL CAROTID DISEASE: Patient has a known left internal carotid artery occlusion.  There was no significant disease on the right on the last follow-up study and he was due for a yearly duplex scan in August 2021.   HPI:   Tony [name] is a pleasant 79 y.o. male who I last saw on 09/02/2019.  At that time he had an asymptomatic 5.2 cm infrarenal abdominal aortic aneurysm.  He was due for a 28-month follow-up visit at this time and comes in after having had a CT scan.  He denies any abdominal pain or back pain.  Of note he also has known carotid disease.  He has a known left internal carotid artery occlusion.  He is due for a yearly duplex scan in August 2021.  He denies any history of stroke, TIAs, expressive or receptive aphasia, or amaurosis fugax.  He denies any history of previous myocardial infarction or history of congestive heart failure.  He has undergone previous coronary revascularization however.  He is followed by Dr. Percival Spanish.  Has had no recent chest pain or significant shortness of breath.  He quit smoking in 1993 but previously smoked 3 packs a day for 40 years.  His other  risk factors for peripheral vascular disease include type 2 diabetes, hypertension, and hypercholesterolemia.  He is on a statin but states that he cannot take aspirin because every time he does he gets gout in his right great toe.  Thus he does not tolerate aspirin.  Past Medical History:  Diagnosis Date  . Anginal pain ([place])    2014  . Arthritis   . Carotid artery disease ([place])    left ICA occlusion  . Coronary artery disease   . Diabetes mellitus Age 79   Type 2  . Gout   . Hyperlipidemia   . Hypertension   . Joint pain   . Leg pain   . PVD (peripheral vascular disease) (H[facility])    SFA 50%, stenosis right profunda    Family History  Problem Relation Age of Onset  . Heart failure Mother 69  . Cancer Sister     SOCIAL HISTORY: Social History   Tobacco Use  . Smoking status: Former Smoker    Packs/day: 3.00    Years: 40.00    Pack years: 120.00    Types: Cigarettes    Quit date: 05/27/1992    Years since quitting: 27.6  . Smokeless tobacco: Never Used  Substance Use Topics  . Alcohol use: No    Allergies  Allergen Reactions  . Aspirin Other (See Comments)  Causes gout flare    Current Outpatient Medications  Medication Sig Dispense Refill  . Alirocumab (PRALUENT) 150 MG/ML SOAJ Inject 150 mg into the skin every 14 (fourteen) days. 2 pen 11  . benazepril (LOTENSIN) 10 MG tablet Take 10 mg by mouth at bedtime.     Marland Kitchen glipiZIDE (GLUCOTROL) 5 MG tablet Take 5 mg by mouth 2 (two) times daily.      No current facility-administered medications for this visit.    REVIEW OF SYSTEMS:  [X]  denotes positive finding, [ ]  denotes negative finding Cardiac  Comments:  Chest pain or chest pressure:    Shortness of breath upon exertion:    Short of breath when lying flat:    Irregular heart rhythm:        Vascular    Pain in calf, thigh, or hip brought on by ambulation:    Pain in feet at night that wakes you up from your sleep:     Blood clot in your veins:    Leg  swelling:         Pulmonary    Oxygen at home:    Productive cough:     Wheezing:         Neurologic    Sudden weakness in arms or legs:     Sudden numbness in arms or legs:     Sudden onset of difficulty speaking or slurred speech:    Temporary loss of vision in one eye:     Problems with dizziness:         Gastrointestinal    Blood in stool:     Vomited blood:         Genitourinary    Burning when urinating:     Blood in urine:        Psychiatric    Major depression:         Hematologic    Bleeding problems:    Problems with blood clotting too easily:        Skin    Rashes or ulcers:        Constitutional    Fever or chills:     PHYSICAL EXAM:   Vitals:   01/20/20 1246  BP: (!) 145/79  Pulse: 71  Resp: 20  SpO2: 97%  Weight: 220 lb (99.8 kg)  Height: 5\' 11"  (1.803 m)    GENERAL: The patient is a well-nourished male, in no acute distress. The vital signs are documented above. CARDIAC: There is a regular rate and rhythm.  VASCULAR: I do not detect carotid disease. He has palpable femoral pulses. On the right side I cannot palpate pedal pulses.  He has a monophasic dorsalis pedis and posterior tibial signal. On the left side, he has a biphasic dorsalis pedis signal with a monophasic posterior tibial signal. PULMONARY: There is good air exchange bilaterally without wheezing or rales. ABDOMEN: Soft and non-tender with normal pitched bowel sounds.  His aneurysm is palpable and nontender.  MUSCULOSKELETAL: There are no major deformities or cyanosis. NEUROLOGIC: No focal weakness or paresthesias are detected. SKIN: There are no ulcers or rashes noted. PSYCHIATRIC: The patient has a normal affect.  DATA:    CT ANGIOGRAM ABDOMEN AND PELVIS: I have reviewed the images of the CT angiogram of the abdomen and pelvis.  His aneurysm has enlarged to 5.6 cm.  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  He  also has some significant calcific disease especially in the left common femoral artery and this would likely require an open approach for access if we did an EVAR.  [name] [phone]

## 2020-01-24 DIAGNOSIS — H524 Presbyopia: Secondary | ICD-10-CM | POA: Diagnosis not present

## 2020-01-24 DIAGNOSIS — H2510 Age-related nuclear cataract, unspecified eye: Secondary | ICD-10-CM | POA: Diagnosis not present

## 2020-01-24 DIAGNOSIS — H5213 Myopia, bilateral: Secondary | ICD-10-CM | POA: Diagnosis not present

## 2020-01-24 DIAGNOSIS — H5203 Hypermetropia, bilateral: Secondary | ICD-10-CM | POA: Diagnosis not present

## 2020-01-24 DIAGNOSIS — H52229 Regular astigmatism, unspecified eye: Secondary | ICD-10-CM | POA: Diagnosis not present

## 2020-01-31 DIAGNOSIS — I714 Abdominal aortic aneurysm, without rupture, unspecified: Secondary | ICD-10-CM | POA: Insufficient documentation

## 2020-01-31 DIAGNOSIS — Z01810 Encounter for preprocedural cardiovascular examination: Secondary | ICD-10-CM | POA: Insufficient documentation

## 2020-01-31 NOTE — Progress Notes (Deleted)
Cardiology Office Note   Date:  01/31/2020   ID:  Tony [name], DOB 02-10-1941, MRN [id number]  PCP:  Ignatius Specking, MD  Cardiologist:   [name], MD   No chief complaint on file.     History of Present Illness: Tony [name] is a 79 y.o. male who presents for followup after CABG in June of 2014.  He is being considered for endovascular repair if an AAA.  I reviewed the notes from Dr. Edilia Bo.  He might be able to have endovascular repair but might need a cut down.   He is here for preop clearance. ***   ***Since I last saw him he had shoulder surgery.  He is also found to have an aortic aneurysm.  This was picked up in the fall when he had some pain.  He was going to have it repaired but tested Covid positive.  This was in November.  He had no symptoms with this.  He is now scheduled to have endovascular repair this afternoon.  All of the imaging was apparently done at [facility] and I do not have these records.  He is being treated by Dr. Edilia Bo.    He has noticed that his blood pressure is running  up and he is starting to take an occasional lisinopril occasionally.  He was on metoprolol at one point but he has not had renewals on this in forever.  The patient denies any new symptoms such as chest discomfort, neck or arm discomfort. There has been no new shortness of breath, PND or orthopnea. There have been no reported palpitations, presyncope or syncope.  He said he did very well with his shoulder surgery.  Past Medical History:  Diagnosis Date   Anginal pain ([facility])    2014   Arthritis    Carotid artery disease ([facility])    left ICA occlusion   Coronary artery disease    Diabetes mellitus Age 34   Type 2   Gout    Hyperlipidemia    Hypertension    Joint pain    Leg pain    PVD (peripheral vascular disease) (H[facility])    SFA 50%, stenosis right profunda    Past Surgical History:  Procedure Laterality Date   BACK SURGERY  2010   lumbar   CERVICAL  SPINE SURGERY  2010   CORONARY ARTERY BYPASS GRAFT N/A 02/09/2013   Procedure: CORONARY ARTERY BYPASS GRAFTING (CABG);  Surgeon: Kerin Perna, MD;  Location: [facility] OR;  Service: Open Heart Surgery;  Laterality: N/A;  LIMA to the LAD, SVG to right coronary artery, sequential SVG to OM and distal circumflex.   INTRAOPERATIVE TRANSESOPHAGEAL ECHOCARDIOGRAM N/A 02/09/2013   Procedure: INTRAOPERATIVE TRANSESOPHAGEAL ECHOCARDIOGRAM;  Surgeon: Kerin Perna, MD;  Location: [facility] OR;  Service: Open Heart Surgery;  Laterality: N/A;   REVERSE SHOULDER ARTHROPLASTY Right 06/25/2018   Procedure: RIGHT REVERSE SHOULDER ARTHROPLASTY;  Surgeon: Bjorn Pippin, MD;  Location: [facility] OR;  Service: Orthopedics;  Laterality: Right;   REVERSE TOTAL SHOULDER ARTHROPLASTY Right 06/25/2018     Current Outpatient Medications  Medication Sig Dispense Refill   Alirocumab (PRALUENT) 150 MG/ML SOAJ Inject 150 mg into the skin every 14 (fourteen) days. 2 pen 11   benazepril (LOTENSIN) 10 MG tablet Take 10 mg by mouth at bedtime.      glipiZIDE (GLUCOTROL) 5 MG tablet Take 5 mg by mouth 2 (two) times daily.      No current facility-administered medications  for this visit.    Allergies:   Aspirin    ROS:  Please see the history of present illness.   Otherwise, review of systems are positive for ***.   All other systems are reviewed and negative.    PHYSICAL EXAM: VS:  There were no vitals taken for this visit. , BMI There is no height or weight on file to calculate BMI. GENERAL:  Well appearing NECK:  No jugular venous distention, waveform within normal limits, carotid upstroke brisk and symmetric, no bruits, no thyromegaly LUNGS:  Clear to auscultation bilaterally CHEST:  Unremarkable HEART:  PMI not displaced or sustained,S1 and S2 within normal limits, no S3, no S4, no clicks, no rubs, *** murmurs ABD:  Flat, positive bowel sounds normal in frequency in pitch, no bruits, no rebound, no guarding, no midline  pulsatile mass, no hepatomegaly, no splenomegaly EXT:  2 plus pulses throughout, no edema, no cyanosis no clubbing    ***GENERAL:  Well appearing NECK:  No jugular venous distention, waveform within normal limits, carotid upstroke brisk and symmetric, no bruits, no thyromegaly LUNGS:  Clear to auscultation bilaterally CHEST:  Well healed sternotomy scar. HEART:  PMI not displaced or sustained,S1 and S2 within normal limits, no S3, no S4, no clicks, no rubs, soft apical systolic murmur radiating slightly up aortic outflow tract and early peaking, no diastolic murmurs ABD:  Flat, positive bowel sounds normal in frequency in pitch, no bruits, no rebound, no guarding, no midline pulsatile mass, no hepatomegaly, no splenomegaly EXT:  2 plus pulses throughout, no edema, no cyanosis no clubbing   EKG:  EKG is *** ordered today. The ekg ordered today demonstrates sinus rhythm, rate ***, axis within normal limits, intervals within normal limits, poor anterior R wave progression, old anteroseptal infarct, nonspecific lateral T wave changes.   Recent Labs: No results found for requested labs within last 8760 hours.    Lipid Panel    Component Value Date/Time   CHOL 210 (H) 02/06/2013 0425   TRIG 377 (H) 02/06/2013 0425   HDL 32 (L) 02/06/2013 0425   CHOLHDL 6.6 02/06/2013 0425   VLDL 75 (H) 02/06/2013 0425   LDLCALC 103 (H) 02/06/2013 0425      Wt Readings from Last 3 Encounters:  01/20/20 220 lb (99.8 kg)  09/02/19 218 lb (98.9 kg)  09/02/19 219 lb (99.3 kg)      Other studies Reviewed: Additional studies/ records that were reviewed today include: Labs. Review of the above records demonstrates:  Please see elsewhere in the note.     ASSESSMENT AND PLAN:  PREOP:  ***   CAD/CABG:  *** The patient has had no chest pain.  He is going to continue with aggressive risk reduction.  HTN:   ***  BP is elevated.  I am going to increase his lisinopril to 10 mg twice daily.  He will  keep a blood pressure diary and if he is not in the 505L or lower systolic I will add amlodipine.   CAROTID STENOSIS:  He is having this followed by Dr. Scot Dock.  *** He has 100% chronic occlusion of the left carotid and mild plaque on the right in August of this year.  I asked him to arrange follow-up with vascular surgery since he will be followed in our office and if not we can follow this annually in our office.   AAA:    ***  He is due to have this repaired today.  HYPERLIPIDEMIA:  He is intolerant of statin however, he did very well with PCSK9 inhibitor. LDL is ***   His LDL is down to 64.   DM:    A1C was ***  in the low sixes despite his last report.  He lost 14 pounds.   COVID 19 EDUCATION:   He has had COVID 19. *** At this point since he had Covid he is not going to get the vaccine.  Current medicines are reviewed at length with the patient today.  The patient does not have concerns regarding medicines.  The following changes have been made:  ***  Labs/ tests ordered today include: ***  No orders of the defined types were placed in this encounter.    Disposition:   FU with me in ***  month.     Signed, [name], MD  01/31/2020 2:03 PM    [facility]

## 2020-02-01 ENCOUNTER — Ambulatory Visit: Payer: Medicare HMO | Admitting: Cardiology

## 2020-02-01 DIAGNOSIS — I6523 Occlusion and stenosis of bilateral carotid arteries: Secondary | ICD-10-CM | POA: Insufficient documentation

## 2020-02-01 NOTE — Progress Notes (Signed)
Cardiology Office Note   Date:  02/02/2020   ID:  Tony [name], DOB 06/16/1941, MRN [id number]  PCP:  Glenda Chroman, MD  Cardiologist:   Minus Breeding, MD   Chief Complaint  Patient presents with  . AAA      History of Present Illness: Tony [name] is a 79 y.o. male who presents for followup after CABG in June of 2014.  He is being considered for endovascular repair if an AAA.  I reviewed the notes from Dr. Scot Dock.  However, the patient says he was called yesterday and because of anatomy he is going to need open repair.Marland Kitchen   He is here for preop clearance.   He has done very well since his bypass surgery.  He denies any cardiovascular symptoms.  He is very physically active.  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  He carries heavy logs and likes to turn the would making bowls and the like.  The patient denies any new symptoms such as chest discomfort, neck or arm discomfort. There has been no new shortness of breath, PND or orthopnea. There have been no reported palpitations, presyncope or syncope.  Past Medical History:  Diagnosis Date  . Arthritis   . Carotid artery disease ([place])    left ICA occlusion  . Coronary artery disease   . Diabetes mellitus Age 41   Type 2  . Gout   . Hyperlipidemia   . Hypertension   . Joint pain   . Leg pain   . PVD (peripheral vascular disease) (HCC)    SFA 50%, stenosis right profunda    Past Surgical History:  Procedure Laterality Date  . BACK SURGERY  2010   lumbar  . CERVICAL SPINE SURGERY  2010  . CORONARY ARTERY BYPASS GRAFT N/A 02/09/2013   Procedure: CORONARY ARTERY BYPASS GRAFTING (CABG);  Surgeon: Ivin Poot, MD;  Location: [facility];  Service: Open Heart Surgery;  Laterality: N/A;  LIMA to the LAD, SVG to right coronary artery, sequential SVG to OM and distal circumflex.  . INTRAOPERATIVE TRANSESOPHAGEAL ECHOCARDIOGRAM N/A 02/09/2013   Procedure: INTRAOPERATIVE TRANSESOPHAGEAL ECHOCARDIOGRAM;  Surgeon: Ivin Poot,  MD;  Location: [facility];  Service: Open Heart Surgery;  Laterality: N/A;  . REVERSE SHOULDER ARTHROPLASTY Right 06/25/2018   Procedure: RIGHT REVERSE SHOULDER ARTHROPLASTY;  Surgeon: Hiram Gash, MD;  Location: [facility];  Service: Orthopedics;  Laterality: Right;  . REVERSE TOTAL SHOULDER ARTHROPLASTY Right 06/25/2018     Current Outpatient Medications  Medication Sig Dispense Refill  . Alirocumab (PRALUENT) 150 MG/ML SOAJ Inject 150 mg into the skin every 14 (fourteen) days. 2 pen 11  . benazepril (LOTENSIN) 10 MG tablet Take 10 mg by mouth at bedtime.     Marland Kitchen glipiZIDE (GLUCOTROL) 5 MG tablet Take 5 mg by mouth 2 (two) times daily.      No current facility-administered medications for this visit.    Allergies:   Aspirin    ROS:  Please see the history of present illness.   Otherwise, review of systeM are positive for none.   All other systems are reviewed and negative.    PHYSICAL EXAM: VS:  BP 140/78   Pulse 63   Ht 5\' 11"  (1.803 m)   Wt 221 lb (100.2 kg)   SpO2 97%   BMI 30.82 kg/m  , BMI Body mass index is 30.82 kg/m. GENERAL:  Well appearing NECK:  No jugular venous distention, waveform within normal limits, carotid  upstroke brisk and symmetric, no bruits, no thyromegaly LUNGS:  Clear to auscultation bilaterally CHEST: Well-healed surgical scar HEART:  PMI not displaced or sustained,S1 and S2 within normal limits, no S3, no S4, no clicks, no rubs, 3 out of 6 apical systolic murmur ending in mid systole, no diastolic murmurs ABD:  Flat, positive bowel sounds normal in frequency in pitch, no bruits, no rebound, no guarding, positive midline pulsatile mass, no hepatomegaly, no splenomegaly EXT:  2 plus pulses throughout, no edema, no cyanosis no clubbing    EKG:  EKG is  ordered today. The ekg ordered today demonstrates sinus rhythm, rate 63, axis within normal limits, intervals within normal limits, poor anterior R wave progression, old anteroseptal infarct, nonspecific  lateral T wave changes.  No change from previous.   Recent Labs: No results found for requested labs within last 8760 hours.    Lipid Panel    Component Value Date/Time   CHOL 210 (H) 02/06/2013 0425   TRIG 377 (H) 02/06/2013 0425   HDL 32 (L) 02/06/2013 0425   CHOLHDL 6.6 02/06/2013 0425   VLDL 75 (H) 02/06/2013 0425   LDLCALC 103 (H) 02/06/2013 0425      Wt Readings from Last 3 Encounters:  02/02/20 221 lb (100.2 kg)  01/20/20 220 lb (99.8 kg)  09/02/19 218 lb (98.9 kg)      Other studies Reviewed: Additional studies/ records that were reviewed today include: Labs. Review of the above records demonstrates:  Please see elsewhere in the note.     ASSESSMENT AND PLAN:  PREOP: The patient has a very high functional level.  He has no high risk features or findings.  His preoperative cardiovascular risk is at least moderate by Lee's criteria.  However, this is because of underlying medical conditions rather than any active symptoms.  Patient and his wife understand this and there is no indication that he needs further testing beforehand.  They are willing to accept the risk for the necessary benefit of the surgery.  No change in therapy.  The patient his wife and I had a long discussion about this.    CAD/CABG:   He has no symptoms.  He is not a great job risk reduction since his bypass surgery.  He is very physically active.  He will continue with secondary risk reduction.   HTN:    The blood pressure is at target.  No change in therapy.  AS: The patient had mild aortic stenosis in 2014.  I would like to repeat an echocardiogram.  CAROTID STENOSIS:  He is having this followed by Dr. Edilia Bo.  He will continue with risk reduction.   AAA:    As above.  HYPERLIPIDEMIA:       He is intolerant of statin however, he did very well with PCSK9 inhibitor.  DM:    A1C was in the sixes.  I will defer to Tony Specking, MD   Current medicines are reviewed at length with the patient  today.  The patient does not have concerns regarding medicines.  The following changes have been made:  None  Labs/ tests ordered today include: NA  Orders Placed This Encounter  Procedures  . EKG 12-Lead  . ECHOCARDIOGRAM COMPLETE     Disposition:   FU with me in 12 months .     Signed, [name], MD  02/02/2020 12:55 PM    [facility]

## 2020-02-02 ENCOUNTER — Encounter: Payer: Self-pay | Admitting: Cardiology

## 2020-02-02 ENCOUNTER — Ambulatory Visit (INDEPENDENT_AMBULATORY_CARE_PROVIDER_SITE_OTHER): Payer: Medicare HMO | Admitting: Cardiology

## 2020-02-02 ENCOUNTER — Other Ambulatory Visit: Payer: Self-pay

## 2020-02-02 VITALS — BP 140/78 | HR 63 | Ht 71.0 in | Wt 221.0 lb

## 2020-02-02 DIAGNOSIS — I10 Essential (primary) hypertension: Secondary | ICD-10-CM

## 2020-02-02 DIAGNOSIS — I714 Abdominal aortic aneurysm, without rupture, unspecified: Secondary | ICD-10-CM

## 2020-02-02 DIAGNOSIS — E118 Type 2 diabetes mellitus with unspecified complications: Secondary | ICD-10-CM | POA: Diagnosis not present

## 2020-02-02 DIAGNOSIS — I6523 Occlusion and stenosis of bilateral carotid arteries: Secondary | ICD-10-CM | POA: Diagnosis not present

## 2020-02-02 DIAGNOSIS — Z7189 Other specified counseling: Secondary | ICD-10-CM

## 2020-02-02 DIAGNOSIS — I350 Nonrheumatic aortic (valve) stenosis: Secondary | ICD-10-CM | POA: Diagnosis not present

## 2020-02-02 DIAGNOSIS — E785 Hyperlipidemia, unspecified: Secondary | ICD-10-CM

## 2020-02-02 DIAGNOSIS — I2510 Atherosclerotic heart disease of native coronary artery without angina pectoris: Secondary | ICD-10-CM | POA: Diagnosis not present

## 2020-02-02 NOTE — Patient Instructions (Signed)
Medication Instructions:  NO CHANGES *If you need a refill on your cardiac medications before your next appointment, please call your pharmacy*  Lab Work: NONE ORDERED THIS VISIT  Testing/Procedures: Your physician has requested that you have an echocardiogram. Echocardiography is a painless test that uses sound waves to create images of your heart. It provides your doctor with information about the size and shape of your heart and how well your heart's chambers and valves are working. This procedure takes approximately one hour. There are no restrictions for this procedure. [address]  Follow-Up: At [facility], you and your health needs are our priority.  As part of our continuing mission to provide you with exceptional heart care, we have created designated Provider Care Teams.  These Care Teams include your primary Cardiologist (physician) and Advanced Practice Providers (APPs -  Physician Assistants and Nurse Practitioners) who all work together to provide you with the care you need, when you need it.   Your next appointment:   12 month(s) IN [place] You will receive a reminder letter in the mail two months in advance. If you don't receive a letter, please call our office to schedule the follow-up appointment.  The format for your next appointment:   In Person  Provider:   [name], MD

## 2020-02-03 DIAGNOSIS — E7800 Pure hypercholesterolemia, unspecified: Secondary | ICD-10-CM | POA: Diagnosis not present

## 2020-02-03 DIAGNOSIS — Z1331 Encounter for screening for depression: Secondary | ICD-10-CM | POA: Diagnosis not present

## 2020-02-03 DIAGNOSIS — Z6831 Body mass index (BMI) 31.0-31.9, adult: Secondary | ICD-10-CM | POA: Diagnosis not present

## 2020-02-03 DIAGNOSIS — R69 Illness, unspecified: Secondary | ICD-10-CM | POA: Diagnosis not present

## 2020-02-03 DIAGNOSIS — I10 Essential (primary) hypertension: Secondary | ICD-10-CM | POA: Diagnosis not present

## 2020-02-03 DIAGNOSIS — Z1339 Encounter for screening examination for other mental health and behavioral disorders: Secondary | ICD-10-CM | POA: Diagnosis not present

## 2020-02-03 DIAGNOSIS — Z299 Encounter for prophylactic measures, unspecified: Secondary | ICD-10-CM | POA: Diagnosis not present

## 2020-02-03 DIAGNOSIS — Z0100 Encounter for examination of eyes and vision without abnormal findings: Secondary | ICD-10-CM | POA: Diagnosis not present

## 2020-02-03 DIAGNOSIS — Z1211 Encounter for screening for malignant neoplasm of colon: Secondary | ICD-10-CM | POA: Diagnosis not present

## 2020-02-03 DIAGNOSIS — Z125 Encounter for screening for malignant neoplasm of prostate: Secondary | ICD-10-CM | POA: Diagnosis not present

## 2020-02-03 DIAGNOSIS — R5383 Other fatigue: Secondary | ICD-10-CM | POA: Diagnosis not present

## 2020-02-03 DIAGNOSIS — Z7189 Other specified counseling: Secondary | ICD-10-CM | POA: Diagnosis not present

## 2020-02-03 DIAGNOSIS — Z79899 Other long term (current) drug therapy: Secondary | ICD-10-CM | POA: Diagnosis not present

## 2020-02-03 DIAGNOSIS — Z0000 Encounter for general adult medical examination without abnormal findings: Secondary | ICD-10-CM | POA: Diagnosis not present

## 2020-02-05 ENCOUNTER — Other Ambulatory Visit: Payer: Self-pay

## 2020-02-05 ENCOUNTER — Other Ambulatory Visit (HOSPITAL_COMMUNITY)
Admission: RE | Admit: 2020-02-05 | Discharge: 2020-02-05 | Disposition: A | Discharge status: Home / Self Care | Payer: Medicare HMO | Source: Ambulatory Visit | Attending: Vascular Surgery | Admitting: Vascular Surgery

## 2020-02-05 DIAGNOSIS — Z01812 Encounter for preprocedural laboratory examination: Secondary | ICD-10-CM | POA: Insufficient documentation

## 2020-02-05 DIAGNOSIS — Z20822 Contact with and (suspected) exposure to covid-19: Secondary | ICD-10-CM | POA: Insufficient documentation

## 2020-02-05 LAB — SARS CORONAVIRUS 2 (TAT 6-24 HRS): SARS Coronavirus 2: NEGATIVE

## 2020-02-08 ENCOUNTER — Encounter (HOSPITAL_COMMUNITY): Payer: Self-pay | Admitting: Vascular Surgery

## 2020-02-08 ENCOUNTER — Other Ambulatory Visit: Payer: Self-pay

## 2020-02-08 NOTE — Progress Notes (Signed)
Patient denies shortness of breath, fever, cough or chest pain.  PCP - Dr Sherril Croon Cardiologist - Dr Antoine Poche  Chest x-ray - n/a EKG - 02/02/20 Stress Test - n/a ECHO - 02/2013, one scheduled 02/24/20 Cardiac Cath - n/a  Fasting Blood Sugar - 110-120s Checks Blood Sugar _1  times a day  . Do not take oral diabetes medicines (glipizide) the morning of surgery.  Do not take evening dose glipizide tonight.  . If your blood sugar is less than 70 mg/dL, you will need to treat for low blood sugar: o Treat a low blood sugar (less than 70 mg/dL) with  cup of clear juice (cranberry or apple), 4 glucose tablets, OR glucose gel. o Recheck blood sugar in 15 minutes after treatment (to make sure it is greater than 70 mg/dL). If your blood sugar is not greater than 70 mg/dL on recheck, call [phone] for further instructions.  Anesthesia review: Yes  STOP now taking any Aspirin (unless otherwise instructed by your surgeon), Aleve, Naproxen, Ibuprofen, Motrin, Advil, Goody's, BC's, all herbal medications, fish oil, and all vitamins.   Coronavirus Screening Covid test on 02/05/20 was negative.  Patient verbalized understanding of instructions that were given via phone.

## 2020-02-08 NOTE — Progress Notes (Signed)
.Anesthesia Chart Review: SAME DAY WORK-UP (case noted posted until Friday 02/05/20)   Case: [id number] Date/Time: 02/09/20 0715   Procedure: OPEN ABDOMINAL AORTIC ANEURYSM REPAIR (N/A )   Anesthesia type: General   Pre-op diagnosis: ABDOMINAL AORTIC ANEURYSM   Location: [place] OR ROOM 12 / [place] OR   Surgeons: Angelia Mould, MD      DISCUSSION: Patient is a 79 year old male scheduled for the above procedure.   History includes former smoker (quit '93), CAD (s/p CABG: LIMA-LAD, SVG-RCA, SVG-OM1-dCX 02/09/13), DM2, HTN, HLD, PVD (> 75% right SFA stenosis 11/2011), AAA, left ICA occlusion, childhood murmur (he also have very mild AS on 2014 echo; planned follow-up echo 02/24/20).  He was seen by cardiologist Dr. Percival Spanish on 02/02/2020 for preoperative evaluation. 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 He wrote, "PREOP: The patient has a very high functional level.  He has no high risk features or findings.  His preoperative cardiovascular risk is at least moderate by Lee's criteria.  However, this is because of underlying medical conditions rather than any active symptoms.  Patient and his wife understand this and there is no indication that he needs further testing beforehand.  They are willing to accept the risk for the necessary benefit of the surgery.  No change in therapy.  The patient his wife and I had a long discussion about this." He did order an echo (scheduled for 02/24/20) to re-evaluate mild AS from 2014 echo (but as documented above, "no indication that he needs further testing behorehand".  02/05/2020 presurgical COVID-19 test negative.  He has a same-day work-up, so labs and anesthesia team evaluation on the day of surgery.  He reported home CBGs is running around 110 120's. Per May 2021 CTA report, Cr 1.1, eGRF 64.    VS: Ht 5\' 11"  (1.803 m)   Wt 99.8 kg   BMI 30.68 kg/m  BP Readings from Last 3  Encounters:  02/02/20 140/78  01/20/20 (!) 145/79  09/02/19 137/86   Pulse Readings from Last 3 Encounters:  02/02/20 63  01/20/20 71  09/02/19 71    PROVIDERS: Glenda Chroman, MD is PCP Minus Breeding, MD is cardiologist.  Deitra Mayo, MD is vascular surgeon.    LABS: For day of surgery   IMAGES: CTA abd/pelvis 01/11/20: IMPRESSION: VASCULAR - 5.6 cm infrarenal AAA, slight increase in size, previously 5.2 cm in maximal dimension. No other acute vascular finding or complicating feature. - Mesenteric and renal vasculature remain patent. - No associated iliac aneurysm or occlusive process. NON-VASCULAR - No other acute intra-abdominal or pelvic finding. - Additional incidental and benign findings as above. - Aortic aneurysm (ICD10-I70.0).   EKG: 02/02/2020: Normal sinus rhythm Nonspecific T wave abnormality   CV: Carotid U/S 04/20/19: Summary:  Right Carotid: Velocities in the right ICA are consistent with a 1-39%  stenosis. Non-hemodynamically significant plaque <50% noted in the  CCA.  Left Carotid: Evidence consistent with a total occlusion of the left ICA.  Non-hemodynamically significant plaque <50% noted in the  CCA.  Vertebrals: Bilateral vertebral arteries demonstrate antegrade flow.  Subclavians: Normal flow hemodynamics were seen in bilateral subclavian  arteries.   Echo 03/02/13: Study Conclusions - Left ventricle: The cavity size was normal. Wall thickness was increased in a pattern of moderate LVH. Systolic function was normal. The estimated ejection fraction was in the range of 55% to 65%. Wall motion was normal; there  were no regional wall motion abnormalities. Features are consistent with a pseudonormal left ventricular filling pattern, with concomitant abnormal relaxation and increased filling pressure (grade 2 diastolic dysfunction). - Aortic valve: There was very mild stenosis. - Mitral valve: Mild  regurgitation. - Left atrium: The atrium was mildly dilated. - Right atrium: The atrium was mildly dilated. - Atrial septum: No defect or patent foramen ovale was identified. - Pericardium, extracardiac: A trivial pericardial effusion was identified.  Last [facility] 02/05/13 prior to CABG.   Past Medical History:  Diagnosis Date  . AAA (abdominal aortic aneurysm) (HCC)   . Arthritis   . Carotid artery disease (HCC)    left ICA occlusion  . Coronary artery disease   . Diabetes mellitus Age 79   Type 2  . Gout   . Heart murmur    as a child  . Hyperlipidemia   . Hypertension   . Joint pain   . Leg pain   . PVD (peripheral vascular disease) (HCC)    SFA 50%, stenosis right profunda    Past Surgical History:  Procedure Laterality Date  . BACK SURGERY  2010   lumbar  . CERVICAL SPINE SURGERY  2011  . CORONARY ARTERY BYPASS GRAFT N/A 02/09/2013   Procedure: CORONARY ARTERY BYPASS GRAFTING (CABG);  Surgeon: Kerin Perna, MD;  Location: [facility] OR;  Service: Open Heart Surgery;  Laterality: N/A;  LIMA to the LAD, SVG to right coronary artery, sequential SVG to OM and distal circumflex.  . INTRAOPERATIVE TRANSESOPHAGEAL ECHOCARDIOGRAM N/A 02/09/2013   Procedure: INTRAOPERATIVE TRANSESOPHAGEAL ECHOCARDIOGRAM;  Surgeon: Kerin Perna, MD;  Location: [facility] OR;  Service: Open Heart Surgery;  Laterality: N/A;  . REVERSE SHOULDER ARTHROPLASTY Right 06/25/2018   Procedure: RIGHT REVERSE SHOULDER ARTHROPLASTY;  Surgeon: Bjorn Pippin, MD;  Location: [facility] OR;  Service: Orthopedics;  Laterality: Right;  . REVERSE TOTAL SHOULDER ARTHROPLASTY Right 06/25/2018    MEDICATIONS: No current facility-administered medications for this encounter.   . Alirocumab (PRALUENT) 150 MG/ML SOAJ  . benazepril (LOTENSIN) 10 MG tablet  . diclofenac Sodium (VOLTAREN) 1 % GEL  . glipiZIDE (GLUCOTROL) 5 MG tablet    Shonna Chock, PA-C Surgical Short Stay/Anesthesiology [facility] Phone [phone] [facility] Phone  [phone] 02/08/2020 1:32 PM

## 2020-02-08 NOTE — Anesthesia Preprocedure Evaluation (Addendum)
Anesthesia Evaluation  Patient identified by MRN, date of birth, ID band Patient awake    Reviewed: Allergy & Precautions, NPO status , Patient's Chart, lab work & pertinent test results  Airway Mallampati: IV  TM Distance: >3 FB Neck ROM: Full    Dental  (+) Edentulous Upper, Edentulous Lower   Pulmonary former smoker,    Pulmonary exam normal breath sounds clear to auscultation       Cardiovascular hypertension, Pt. on medications + CAD and + CABG  Normal cardiovascular exam     Neuro/Psych    GI/Hepatic   Endo/Other  diabetes, Type 2  Renal/GU      Musculoskeletal   Abdominal   Peds  Hematology   Anesthesia Other Findings   Reproductive/Obstetrics                            Anesthesia Physical Anesthesia Plan  ASA: III  Anesthesia Plan: General   Post-op Pain Management:    Induction: Intravenous  PONV Risk Score and Plan: 2  Airway Management Planned: Oral ETT  Additional Equipment: Arterial line, CVP, PA Cath and Ultrasound Guidance Line Placement  Intra-op Plan:   Post-operative Plan: Possible Post-op intubation/ventilation  Informed Consent: I have reviewed the patients History and Physical, chart, labs and discussed the procedure including the risks, benefits and alternatives for the proposed anesthesia with the patient or authorized representative who has indicated his/her understanding and acceptance.       Plan Discussed with: CRNA and Surgeon  Anesthesia Plan Comments: (PAT note written 02/08/2020 by Shonna Chock, PA-C. )       Anesthesia Quick Evaluation

## 2020-02-09 ENCOUNTER — Inpatient Hospital Stay (HOSPITAL_COMMUNITY): Payer: Medicare HMO

## 2020-02-09 ENCOUNTER — Encounter (HOSPITAL_COMMUNITY): Payer: Self-pay | Admitting: Vascular Surgery

## 2020-02-09 ENCOUNTER — Encounter (HOSPITAL_COMMUNITY)
Admission: RE | Disposition: A | Discharge status: Home / Self Care | Payer: Self-pay | Source: Home / Self Care | Attending: Vascular Surgery

## 2020-02-09 ENCOUNTER — Inpatient Hospital Stay (HOSPITAL_COMMUNITY)
Admission: RE | Admit: 2020-02-09 | Discharge: 2020-02-13 | DRG: 269 | Disposition: A | Discharge status: Home / Self Care | Payer: Medicare HMO | Attending: Vascular Surgery | Admitting: Vascular Surgery

## 2020-02-09 ENCOUNTER — Other Ambulatory Visit: Payer: Self-pay

## 2020-02-09 ENCOUNTER — Inpatient Hospital Stay (HOSPITAL_COMMUNITY): Payer: Medicare HMO | Admitting: Certified Registered Nurse Anesthetist

## 2020-02-09 DIAGNOSIS — Z8249 Family history of ischemic heart disease and other diseases of the circulatory system: Secondary | ICD-10-CM | POA: Diagnosis not present

## 2020-02-09 DIAGNOSIS — E1151 Type 2 diabetes mellitus with diabetic peripheral angiopathy without gangrene: Secondary | ICD-10-CM | POA: Diagnosis not present

## 2020-02-09 DIAGNOSIS — I6523 Occlusion and stenosis of bilateral carotid arteries: Secondary | ICD-10-CM | POA: Diagnosis present

## 2020-02-09 DIAGNOSIS — I714 Abdominal aortic aneurysm, without rupture, unspecified: Secondary | ICD-10-CM | POA: Diagnosis present

## 2020-02-09 DIAGNOSIS — Z8679 Personal history of other diseases of the circulatory system: Secondary | ICD-10-CM

## 2020-02-09 DIAGNOSIS — J939 Pneumothorax, unspecified: Secondary | ICD-10-CM | POA: Diagnosis not present

## 2020-02-09 DIAGNOSIS — Z87891 Personal history of nicotine dependence: Secondary | ICD-10-CM

## 2020-02-09 DIAGNOSIS — R110 Nausea: Secondary | ICD-10-CM | POA: Diagnosis not present

## 2020-02-09 DIAGNOSIS — E785 Hyperlipidemia, unspecified: Secondary | ICD-10-CM | POA: Diagnosis not present

## 2020-02-09 DIAGNOSIS — J9811 Atelectasis: Secondary | ICD-10-CM | POA: Diagnosis not present

## 2020-02-09 DIAGNOSIS — Z96611 Presence of right artificial shoulder joint: Secondary | ICD-10-CM | POA: Diagnosis present

## 2020-02-09 DIAGNOSIS — Z4682 Encounter for fitting and adjustment of non-vascular catheter: Secondary | ICD-10-CM | POA: Diagnosis not present

## 2020-02-09 DIAGNOSIS — Z888 Allergy status to other drugs, medicaments and biological substances status: Secondary | ICD-10-CM

## 2020-02-09 DIAGNOSIS — I25110 Atherosclerotic heart disease of native coronary artery with unstable angina pectoris: Secondary | ICD-10-CM | POA: Diagnosis not present

## 2020-02-09 DIAGNOSIS — I2510 Atherosclerotic heart disease of native coronary artery without angina pectoris: Secondary | ICD-10-CM | POA: Diagnosis not present

## 2020-02-09 DIAGNOSIS — Z79899 Other long term (current) drug therapy: Secondary | ICD-10-CM | POA: Diagnosis not present

## 2020-02-09 DIAGNOSIS — Z9889 Other specified postprocedural states: Secondary | ICD-10-CM

## 2020-02-09 DIAGNOSIS — E119 Type 2 diabetes mellitus without complications: Secondary | ICD-10-CM | POA: Diagnosis not present

## 2020-02-09 DIAGNOSIS — R109 Unspecified abdominal pain: Secondary | ICD-10-CM | POA: Diagnosis not present

## 2020-02-09 DIAGNOSIS — Z23 Encounter for immunization: Secondary | ICD-10-CM

## 2020-02-09 DIAGNOSIS — I10 Essential (primary) hypertension: Secondary | ICD-10-CM | POA: Diagnosis not present

## 2020-02-09 DIAGNOSIS — Z951 Presence of aortocoronary bypass graft: Secondary | ICD-10-CM

## 2020-02-09 DIAGNOSIS — I517 Cardiomegaly: Secondary | ICD-10-CM | POA: Diagnosis not present

## 2020-02-09 HISTORY — PX: AORTIC ENDARTERECETOMY: SHX5724

## 2020-02-09 HISTORY — DX: Other specified postprocedural states: Z98.890

## 2020-02-09 HISTORY — DX: Personal history of other diseases of the circulatory system: Z86.79

## 2020-02-09 HISTORY — PX: ABDOMINAL AORTIC ANEURYSM REPAIR: SHX42

## 2020-02-09 HISTORY — DX: Cardiac murmur, unspecified: R01.1

## 2020-02-09 HISTORY — PX: AORTA - BILATERAL FEMORAL ARTERY BYPASS GRAFT: SHX1175

## 2020-02-09 LAB — CBC
HCT: 43.2 % (ref 39.0–52.0)
HCT: 41.5 % (ref 39.0–52.0)
Hemoglobin: 14.2 g/dL (ref 13.0–17.0)
Hemoglobin: 13.4 g/dL (ref 13.0–17.0)
MCH: 29.6 pg (ref 26.0–34.0)
MCH: 29.4 pg (ref 26.0–34.0)
MCHC: 32.9 g/dL (ref 30.0–36.0)
MCHC: 32.3 g/dL (ref 30.0–36.0)
MCV: 90.2 fL (ref 80.0–100.0)
MCV: 91 fL (ref 80.0–100.0)
Platelets: 149 10*3/uL — ABNORMAL LOW (ref 150–400)
Platelets: 124 10*3/uL — ABNORMAL LOW (ref 150–400)
RBC: 4.79 MIL/uL (ref 4.22–5.81)
RBC: 4.56 MIL/uL (ref 4.22–5.81)
RDW: 13.2 % (ref 11.5–15.5)
RDW: 13.3 % (ref 11.5–15.5)
WBC: 7.6 10*3/uL (ref 4.0–10.5)
WBC: 18.2 10*3/uL — ABNORMAL HIGH (ref 4.0–10.5)
nRBC: 0 % (ref 0.0–0.2)
nRBC: 0 % (ref 0.0–0.2)

## 2020-02-09 LAB — COMPREHENSIVE METABOLIC PANEL
ALT: 18 U/L (ref 0–44)
AST: 23 U/L (ref 15–41)
Albumin: 4.2 g/dL (ref 3.5–5.0)
Alkaline Phosphatase: 58 U/L (ref 38–126)
Anion gap: 12 (ref 5–15)
BUN: 13 mg/dL (ref 8–23)
CO2: 24 mmol/L (ref 22–32)
Calcium: 9.3 mg/dL (ref 8.9–10.3)
Chloride: 102 mmol/L (ref 98–111)
Creatinine, Ser: 1.13 mg/dL (ref 0.61–1.24)
GFR calc Af Amer: >60 mL/min (ref >60)
GFR calc non Af Amer: >60 mL/min (ref >60)
Glucose, Bld: 143 mg/dL — ABNORMAL HIGH (ref 70–99)
Potassium: 4 mmol/L (ref 3.5–5.1)
Sodium: 138 mmol/L (ref 135–145)
Total Bilirubin: 0.8 mg/dL (ref 0.3–1.2)
Total Protein: 7.1 g/dL (ref 6.5–8.1)

## 2020-02-09 LAB — POCT I-STAT, CHEM 8
BUN: 15 mg/dL (ref 8–23)
Calcium, Ion: 1.12 mmol/L — ABNORMAL LOW (ref 1.15–1.40)
Chloride: 103 mmol/L (ref 98–111)
Creatinine, Ser: 0.9 mg/dL (ref 0.61–1.24)
Glucose, Bld: 193 mg/dL — ABNORMAL HIGH (ref 70–99)
HCT: 34 % — ABNORMAL LOW (ref 39.0–52.0)
Hemoglobin: 11.6 g/dL — ABNORMAL LOW (ref 13.0–17.0)
Potassium: 4 mmol/L (ref 3.5–5.1)
Sodium: 136 mmol/L (ref 135–145)
TCO2: 27 mmol/L (ref 22–32)

## 2020-02-09 LAB — POCT I-STAT 7, (LYTES, BLD GAS, ICA,H+H)
Acid-Base Excess: 0 mmol/L (ref 0.0–2.0)
Acid-Base Excess: 1 mmol/L (ref 0.0–2.0)
Acid-base deficit: 1 mmol/L (ref 0.0–2.0)
Bicarbonate: 26 mmol/L (ref 20.0–28.0)
Bicarbonate: 26.4 mmol/L (ref 20.0–28.0)
Bicarbonate: 26.2 mmol/L (ref 20.0–28.0)
Calcium, Ion: 1.16 mmol/L (ref 1.15–1.40)
Calcium, Ion: 1.11 mmol/L — ABNORMAL LOW (ref 1.15–1.40)
Calcium, Ion: 1.12 mmol/L — ABNORMAL LOW (ref 1.15–1.40)
HCT: 37 % — ABNORMAL LOW (ref 39.0–52.0)
HCT: 35 % — ABNORMAL LOW (ref 39.0–52.0)
HCT: 34 % — ABNORMAL LOW (ref 39.0–52.0)
Hemoglobin: 12.6 g/dL — ABNORMAL LOW (ref 13.0–17.0)
Hemoglobin: 11.9 g/dL — ABNORMAL LOW (ref 13.0–17.0)
Hemoglobin: 11.6 g/dL — ABNORMAL LOW (ref 13.0–17.0)
O2 Saturation: 100 %
O2 Saturation: 100 %
O2 Saturation: 100 %
Patient temperature: 35.7
Patient temperature: 36.4
Patient temperature: 36.6
Potassium: 3.7 mmol/L (ref 3.5–5.1)
Potassium: 3.9 mmol/L (ref 3.5–5.1)
Potassium: 4.2 mmol/L (ref 3.5–5.1)
Sodium: 140 mmol/L (ref 135–145)
Sodium: 137 mmol/L (ref 135–145)
Sodium: 139 mmol/L (ref 135–145)
TCO2: 27 mmol/L (ref 22–32)
TCO2: 28 mmol/L (ref 22–32)
TCO2: 28 mmol/L (ref 22–32)
pCO2 arterial: 43.8 mmHg (ref 32.0–48.0)
pCO2 arterial: 40.8 mmHg (ref 32.0–48.0)
pCO2 arterial: 51.3 mmHg — ABNORMAL HIGH (ref 32.0–48.0)
pH, Arterial: 7.376 (ref 7.350–7.450)
pH, Arterial: 7.416 (ref 7.350–7.450)
pH, Arterial: 7.313 — ABNORMAL LOW (ref 7.350–7.450)
pO2, Arterial: 227 mmHg — ABNORMAL HIGH (ref 83.0–108.0)
pO2, Arterial: 379 mmHg — ABNORMAL HIGH (ref 83.0–108.0)
pO2, Arterial: 457 mmHg — ABNORMAL HIGH (ref 83.0–108.0)

## 2020-02-09 LAB — BASIC METABOLIC PANEL
Anion gap: 11 (ref 5–15)
BUN: 16 mg/dL (ref 8–23)
CO2: 21 mmol/L — ABNORMAL LOW (ref 22–32)
Calcium: 8.2 mg/dL — ABNORMAL LOW (ref 8.9–10.3)
Chloride: 105 mmol/L (ref 98–111)
Creatinine, Ser: 1.18 mg/dL (ref 0.61–1.24)
GFR calc Af Amer: >60 mL/min (ref >60)
GFR calc non Af Amer: 59 mL/min — ABNORMAL LOW (ref >60)
Glucose, Bld: 217 mg/dL — ABNORMAL HIGH (ref 70–99)
Potassium: 4.4 mmol/L (ref 3.5–5.1)
Sodium: 137 mmol/L (ref 135–145)

## 2020-02-09 LAB — BLOOD GAS, ARTERIAL
Acid-base deficit: 3.2 mmol/L — ABNORMAL HIGH (ref 0.0–2.0)
Bicarbonate: 21.6 mmol/L (ref 20.0–28.0)
FIO2: 28
O2 Saturation: 96 %
Patient temperature: 36.5
pCO2 arterial: 39.9 mmHg (ref 32.0–48.0)
pH, Arterial: 7.35 (ref 7.350–7.450)
pO2, Arterial: 103 mmHg (ref 83.0–108.0)

## 2020-02-09 LAB — POCT ACTIVATED CLOTTING TIME
Activated Clotting Time: 147 seconds
Activated Clotting Time: 164 seconds
Activated Clotting Time: 186 seconds

## 2020-02-09 LAB — URINALYSIS, ROUTINE W REFLEX MICROSCOPIC
Bacteria, UA: NONE SEEN
Bilirubin Urine: NEGATIVE
Glucose, UA: NEGATIVE mg/dL
Hgb urine dipstick: NEGATIVE
Ketones, ur: NEGATIVE mg/dL
Nitrite: NEGATIVE
Protein, ur: NEGATIVE mg/dL
Specific Gravity, Urine: 1.008 (ref 1.005–1.030)
pH: 7 (ref 5.0–8.0)

## 2020-02-09 LAB — GLUCOSE, CAPILLARY
Glucose-Capillary: 144 mg/dL — ABNORMAL HIGH (ref 70–99)
Glucose-Capillary: 170 mg/dL — ABNORMAL HIGH (ref 70–99)
Glucose-Capillary: 203 mg/dL — ABNORMAL HIGH (ref 70–99)

## 2020-02-09 LAB — APTT: aPTT: 25 seconds (ref 24–36)

## 2020-02-09 LAB — SURGICAL PCR SCREEN
MRSA, PCR: NEGATIVE
Staphylococcus aureus: NEGATIVE

## 2020-02-09 LAB — MAGNESIUM: Magnesium: 1.9 mg/dL (ref 1.7–2.4)

## 2020-02-09 LAB — HEMOGLOBIN A1C
Hgb A1c MFr Bld: 6.6 % — ABNORMAL HIGH (ref 4.8–5.6)
Mean Plasma Glucose: 142.72 mg/dL

## 2020-02-09 LAB — PREPARE RBC (CROSSMATCH)

## 2020-02-09 LAB — PROTIME-INR
INR: 0.9 (ref 0.8–1.2)
INR: 1.1 (ref 0.8–1.2)
Prothrombin Time: 12.1 seconds (ref 11.4–15.2)
Prothrombin Time: 13.8 seconds (ref 11.4–15.2)

## 2020-02-09 SURGERY — ANEURYSM ABDOMINAL AORTIC REPAIR
Anesthesia: General | Site: Abdomen

## 2020-02-09 MED ORDER — SODIUM CHLORIDE 0.9 % IV SOLN
INTRAVENOUS | Status: AC
  Filled 2020-02-09: qty 1.2

## 2020-02-09 MED ORDER — ONDANSETRON HCL 4 MG/2ML IJ SOLN: 4.0000 mg | Freq: Four times a day (QID) | INTRAMUSCULAR | Status: DC | PRN

## 2020-02-09 MED ORDER — SODIUM CHLORIDE 0.9 % IV SOLN
INTRAVENOUS | Status: DC | PRN
  Administered 2020-02-09: 500 mL

## 2020-02-09 MED ORDER — FENTANYL CITRATE (PF) 250 MCG/5ML IJ SOLN
INTRAMUSCULAR | Status: AC
  Filled 2020-02-09: qty 5

## 2020-02-09 MED ORDER — ROCURONIUM BROMIDE 10 MG/ML (PF) SYRINGE
PREFILLED_SYRINGE | INTRAVENOUS | Status: AC
  Filled 2020-02-09: qty 10

## 2020-02-09 MED ORDER — SODIUM CHLORIDE 0.9 % IV SOLN: INTRAVENOUS | Status: DC

## 2020-02-09 MED ORDER — EPHEDRINE SULFATE-NACL 50-0.9 MG/10ML-% IV SOSY
PREFILLED_SYRINGE | INTRAVENOUS | Status: DC | PRN
  Administered 2020-02-09: 10 mg via INTRAVENOUS
  Administered 2020-02-09 (×2): 5 mg via INTRAVENOUS

## 2020-02-09 MED ORDER — SODIUM CHLORIDE 0.9% IV SOLUTION: Freq: Once | INTRAVENOUS | Status: DC

## 2020-02-09 MED ORDER — HEPARIN SODIUM (PORCINE) 1000 UNIT/ML IJ SOLN
INTRAMUSCULAR | Status: DC | PRN
Start: 2020-02-09 — End: 2020-02-09
  Administered 2020-02-09: 2000 [IU] via INTRAVENOUS
  Administered 2020-02-09: 10000 [IU] via INTRAVENOUS
  Administered 2020-02-09: 2000 [IU] via INTRAVENOUS

## 2020-02-09 MED ORDER — CHLORHEXIDINE GLUCONATE CLOTH 2 % EX PADS: 6.0000 | MEDICATED_PAD | Freq: Once | CUTANEOUS | Status: DC

## 2020-02-09 MED ORDER — PNEUMOCOCCAL VAC POLYVALENT 25 MCG/0.5ML IJ INJ
0.5000 mL | INJECTION | INTRAMUSCULAR | Status: AC
  Administered 2020-02-11: 0.5 mL via INTRAMUSCULAR
  Filled 2020-02-09: qty 0.5

## 2020-02-09 MED ORDER — HYDRALAZINE HCL 20 MG/ML IJ SOLN
5.0000 mg | INTRAMUSCULAR | Status: AC | PRN
  Administered 2020-02-12 – 2020-02-13 (×2): 5 mg via INTRAVENOUS
  Filled 2020-02-09 (×2): qty 1

## 2020-02-09 MED ORDER — HYDROMORPHONE 1 MG/ML IV SOLN
INTRAVENOUS | Status: DC
  Administered 2020-02-09: 3 mg via INTRAVENOUS
  Administered 2020-02-09: 30 mg via INTRAVENOUS
  Administered 2020-02-10: 1.8 mg via INTRAVENOUS
  Administered 2020-02-10: 1.5 mg via INTRAVENOUS
  Administered 2020-02-10: 0.6 mg via INTRAVENOUS
  Filled 2020-02-09: qty 30

## 2020-02-09 MED ORDER — SODIUM CHLORIDE 0.9% FLUSH: 9.0000 mL | INTRAVENOUS | Status: DC | PRN

## 2020-02-09 MED ORDER — LABETALOL HCL 5 MG/ML IV SOLN
10.0000 mg | INTRAVENOUS | Status: AC | PRN
  Administered 2020-02-11 (×4): 10 mg via INTRAVENOUS
  Filled 2020-02-09 (×4): qty 4

## 2020-02-09 MED ORDER — NITROGLYCERIN 0.2 MG/ML ON CALL CATH LAB
INTRAVENOUS | Status: DC | PRN
Start: 2020-02-09 — End: 2020-02-09
  Administered 2020-02-09 (×3): 20 ug via INTRAVENOUS
  Administered 2020-02-09 (×8): 40 ug via INTRAVENOUS

## 2020-02-09 MED ORDER — SODIUM CHLORIDE 0.9 % IV SOLN
INTRAVENOUS | Status: DC | PRN
  Administered 2020-02-09: 500 mL via INTRAVENOUS

## 2020-02-09 MED ORDER — SUGAMMADEX SODIUM 200 MG/2ML IV SOLN
INTRAVENOUS | Status: DC | PRN
Start: 2020-02-09 — End: 2020-02-09
  Administered 2020-02-09: 200 mg via INTRAVENOUS

## 2020-02-09 MED ORDER — ORAL CARE MOUTH RINSE: 15.0000 mL | Freq: Once | OROMUCOSAL | Status: AC

## 2020-02-09 MED ORDER — CHLORHEXIDINE GLUCONATE CLOTH 2 % EX PADS
6.0000 | MEDICATED_PAD | Freq: Every day | CUTANEOUS | Status: DC
  Administered 2020-02-09 – 2020-02-13 (×3): 6 via TOPICAL

## 2020-02-09 MED ORDER — HEPARIN SODIUM (PORCINE) 1000 UNIT/ML IJ SOLN
INTRAMUSCULAR | Status: AC
  Filled 2020-02-09: qty 1

## 2020-02-09 MED ORDER — 0.9 % SODIUM CHLORIDE (POUR BTL) OPTIME
TOPICAL | Status: DC | PRN
  Administered 2020-02-09: 4000 mL

## 2020-02-09 MED ORDER — DIPHENHYDRAMINE HCL 50 MG/ML IJ SOLN: 12.5000 mg | Freq: Four times a day (QID) | INTRAMUSCULAR | Status: DC | PRN

## 2020-02-09 MED ORDER — MAGNESIUM SULFATE 2 GM/50ML IV SOLN: 2.0000 g | Freq: Every day | INTRAVENOUS | Status: DC | PRN

## 2020-02-09 MED ORDER — INSULIN ASPART 100 UNIT/ML ~~LOC~~ SOLN
0.0000 [IU] | Freq: Three times a day (TID) | SUBCUTANEOUS | Status: DC
  Administered 2020-02-09: 5 [IU] via SUBCUTANEOUS
  Administered 2020-02-10 – 2020-02-11 (×5): 3 [IU] via SUBCUTANEOUS
  Administered 2020-02-11 – 2020-02-12 (×3): 2 [IU] via SUBCUTANEOUS
  Administered 2020-02-12: 3 [IU] via SUBCUTANEOUS
  Administered 2020-02-13: 2 [IU] via SUBCUTANEOUS

## 2020-02-09 MED ORDER — LIDOCAINE 2% (20 MG/ML) 5 ML SYRINGE
INTRAMUSCULAR | Status: DC | PRN
  Administered 2020-02-09: 100 mg via INTRAVENOUS

## 2020-02-09 MED ORDER — SODIUM CHLORIDE 0.9 % IV SOLN: 500.0000 mL | Freq: Once | INTRAVENOUS | Status: DC | PRN

## 2020-02-09 MED ORDER — PHENYLEPHRINE HCL (PRESSORS) 10 MG/ML IV SOLN
INTRAVENOUS | Status: DC | PRN
Start: 2020-02-09 — End: 2020-02-09
  Administered 2020-02-09 (×4): 80 ug via INTRAVENOUS

## 2020-02-09 MED ORDER — PHENOL 1.4 % MT LIQD: 1.0000 | OROMUCOSAL | Status: DC | PRN

## 2020-02-09 MED ORDER — MIDAZOLAM HCL 2 MG/2ML IJ SOLN
INTRAMUSCULAR | Status: AC
  Filled 2020-02-09: qty 2

## 2020-02-09 MED ORDER — DIPHENHYDRAMINE HCL 12.5 MG/5ML PO ELIX: 12.5000 mg | ORAL_SOLUTION | Freq: Four times a day (QID) | ORAL | Status: DC | PRN

## 2020-02-09 MED ORDER — PANTOPRAZOLE SODIUM 40 MG IV SOLR
40.0000 mg | INTRAVENOUS | Status: DC
  Administered 2020-02-09 – 2020-02-11 (×3): 40 mg via INTRAVENOUS
  Filled 2020-02-09 (×3): qty 40

## 2020-02-09 MED ORDER — ROCURONIUM BROMIDE 10 MG/ML (PF) SYRINGE
PREFILLED_SYRINGE | INTRAVENOUS | Status: DC | PRN
  Administered 2020-02-09: 80 mg via INTRAVENOUS
  Administered 2020-02-09: 30 mg via INTRAVENOUS
  Administered 2020-02-09: 40 mg via INTRAVENOUS

## 2020-02-09 MED ORDER — GUAIFENESIN-DM 100-10 MG/5ML PO SYRP: 15.0000 mL | ORAL_SOLUTION | ORAL | Status: DC | PRN

## 2020-02-09 MED ORDER — DOCUSATE SODIUM 100 MG PO CAPS
100.0000 mg | ORAL_CAPSULE | Freq: Every day | ORAL | Status: DC
  Administered 2020-02-11 – 2020-02-12 (×2): 100 mg via ORAL
  Filled 2020-02-09 (×3): qty 1

## 2020-02-09 MED ORDER — WHITE PETROLATUM EX OINT
TOPICAL_OINTMENT | CUTANEOUS | Status: DC | PRN
  Filled 2020-02-09: qty 28.35

## 2020-02-09 MED ORDER — ACETAMINOPHEN 325 MG PO TABS
325.0000 mg | ORAL_TABLET | ORAL | Status: DC | PRN
  Administered 2020-02-11 (×2): 650 mg via ORAL
  Filled 2020-02-09 (×2): qty 2

## 2020-02-09 MED ORDER — METOPROLOL TARTRATE 5 MG/5ML IV SOLN: 2.0000 mg | INTRAVENOUS | Status: DC | PRN

## 2020-02-09 MED ORDER — ALUM & MAG HYDROXIDE-SIMETH 200-200-20 MG/5ML PO SUSP: 15.0000 mL | ORAL | Status: DC | PRN

## 2020-02-09 MED ORDER — CEFAZOLIN SODIUM-DEXTROSE 2-4 GM/100ML-% IV SOLN
2.0000 g | Freq: Three times a day (TID) | INTRAVENOUS | Status: AC
  Administered 2020-02-09 – 2020-02-10 (×2): 2 g via INTRAVENOUS
  Filled 2020-02-09 (×3): qty 100

## 2020-02-09 MED ORDER — THROMBIN (RECOMBINANT) 20000 UNITS EX SOLR
CUTANEOUS | Status: AC
  Filled 2020-02-09: qty 20000

## 2020-02-09 MED ORDER — ACETAMINOPHEN 325 MG RE SUPP
325.0000 mg | RECTAL | Status: DC | PRN
  Filled 2020-02-09: qty 2

## 2020-02-09 MED ORDER — LACTATED RINGERS IV SOLN: INTRAVENOUS | Status: DC | PRN

## 2020-02-09 MED ORDER — HEMOSTATIC AGENTS (NO CHARGE) OPTIME
TOPICAL | Status: DC | PRN
  Administered 2020-02-09: 1 via TOPICAL

## 2020-02-09 MED ORDER — CHLORHEXIDINE GLUCONATE 0.12 % MT SOLN
OROMUCOSAL | Status: AC
  Administered 2020-02-09: 15 mL via OROMUCOSAL
  Filled 2020-02-09: qty 15

## 2020-02-09 MED ORDER — MIDAZOLAM HCL 5 MG/5ML IJ SOLN
INTRAMUSCULAR | Status: DC | PRN
  Administered 2020-02-09 (×2): 1 mg via INTRAVENOUS

## 2020-02-09 MED ORDER — NITROGLYCERIN IN D5W 200-5 MCG/ML-% IV SOLN
INTRAVENOUS | Status: DC | PRN
Start: 2020-02-09 — End: 2020-02-09
  Administered 2020-02-09: 5 ug/min via INTRAVENOUS

## 2020-02-09 MED ORDER — ALBUMIN HUMAN 5 % IV SOLN: INTRAVENOUS | Status: DC | PRN

## 2020-02-09 MED ORDER — NALOXONE HCL 0.4 MG/ML IJ SOLN: 0.4000 mg | INTRAMUSCULAR | Status: DC | PRN

## 2020-02-09 MED ORDER — EPHEDRINE 5 MG/ML INJ
INTRAVENOUS | Status: AC
  Filled 2020-02-09: qty 10

## 2020-02-09 MED ORDER — POTASSIUM CHLORIDE CRYS ER 20 MEQ PO TBCR: 20.0000 meq | EXTENDED_RELEASE_TABLET | Freq: Every day | ORAL | Status: DC | PRN

## 2020-02-09 MED ORDER — FENTANYL CITRATE (PF) 100 MCG/2ML IJ SOLN
INTRAMUSCULAR | Status: DC | PRN
  Administered 2020-02-09: 50 ug via INTRAVENOUS
  Administered 2020-02-09: 100 ug via INTRAVENOUS
  Administered 2020-02-09 (×4): 50 ug via INTRAVENOUS
  Administered 2020-02-09: 100 ug via INTRAVENOUS
  Administered 2020-02-09 (×2): 50 ug via INTRAVENOUS
  Administered 2020-02-09: 200 ug via INTRAVENOUS
  Administered 2020-02-09: 50 ug via INTRAVENOUS

## 2020-02-09 MED ORDER — ORAL CARE MOUTH RINSE
15.0000 mL | Freq: Two times a day (BID) | OROMUCOSAL | Status: DC
  Administered 2020-02-09 – 2020-02-13 (×6): 15 mL via OROMUCOSAL

## 2020-02-09 MED ORDER — PROPOFOL 10 MG/ML IV BOLUS
INTRAVENOUS | Status: DC | PRN
  Administered 2020-02-09: 50 mg via INTRAVENOUS
  Administered 2020-02-09: 100 mg via INTRAVENOUS

## 2020-02-09 MED ORDER — PANTOPRAZOLE SODIUM 40 MG PO TBEC: 40.0000 mg | DELAYED_RELEASE_TABLET | Freq: Every day | ORAL | Status: DC

## 2020-02-09 MED ORDER — CHLORHEXIDINE GLUCONATE 0.12 % MT SOLN: 15.0000 mL | Freq: Once | OROMUCOSAL | Status: AC

## 2020-02-09 MED ORDER — LIDOCAINE 2% (20 MG/ML) 5 ML SYRINGE
INTRAMUSCULAR | Status: AC
  Filled 2020-02-09: qty 5

## 2020-02-09 MED ORDER — DEXAMETHASONE SODIUM PHOSPHATE 10 MG/ML IJ SOLN
INTRAMUSCULAR | Status: AC
  Filled 2020-02-09: qty 1

## 2020-02-09 MED ORDER — ONDANSETRON HCL 4 MG/2ML IJ SOLN
INTRAMUSCULAR | Status: AC
  Filled 2020-02-09: qty 2

## 2020-02-09 MED ORDER — ONDANSETRON HCL 4 MG/2ML IJ SOLN
INTRAMUSCULAR | Status: DC | PRN
  Administered 2020-02-09: 4 mg via INTRAVENOUS

## 2020-02-09 MED ORDER — MORPHINE SULFATE (PF) 2 MG/ML IV SOLN
2.0000 mg | INTRAVENOUS | Status: DC | PRN
  Administered 2020-02-09: 4 mg via INTRAVENOUS
  Filled 2020-02-09 (×2): qty 2

## 2020-02-09 MED ORDER — PHENYLEPHRINE 40 MCG/ML (10ML) SYRINGE FOR IV PUSH (FOR BLOOD PRESSURE SUPPORT)
PREFILLED_SYRINGE | INTRAVENOUS | Status: AC
  Filled 2020-02-09: qty 10

## 2020-02-09 MED ORDER — MANNITOL 25 % IV SOLN
INTRAVENOUS | Status: DC | PRN
Start: 2020-02-09 — End: 2020-02-09
  Administered 2020-02-09: 25 g via INTRAVENOUS

## 2020-02-09 MED ORDER — PHENYLEPHRINE HCL-NACL 10-0.9 MG/250ML-% IV SOLN
INTRAVENOUS | Status: DC | PRN
Start: 2020-02-09 — End: 2020-02-09
  Administered 2020-02-09: 15 ug/min via INTRAVENOUS

## 2020-02-09 MED ORDER — PROTAMINE SULFATE 10 MG/ML IV SOLN
INTRAVENOUS | Status: DC | PRN
Start: 2020-02-09 — End: 2020-02-09
  Administered 2020-02-09: 30 mg via INTRAVENOUS
  Administered 2020-02-09: 10 mg via INTRAVENOUS

## 2020-02-09 MED ORDER — ONDANSETRON HCL 4 MG/2ML IJ SOLN
4.0000 mg | Freq: Four times a day (QID) | INTRAMUSCULAR | Status: DC | PRN
  Administered 2020-02-09 – 2020-02-10 (×2): 4 mg via INTRAVENOUS
  Filled 2020-02-09 (×2): qty 2

## 2020-02-09 MED ORDER — PROPOFOL 10 MG/ML IV BOLUS
INTRAVENOUS | Status: AC
  Filled 2020-02-09: qty 20

## 2020-02-09 MED ORDER — CEFAZOLIN SODIUM-DEXTROSE 2-4 GM/100ML-% IV SOLN
2.0000 g | INTRAVENOUS | Status: AC
  Administered 2020-02-09: 2 g via INTRAVENOUS
  Filled 2020-02-09: qty 100

## 2020-02-09 SURGICAL SUPPLY — 55 items
CANISTER SUCT 3000ML PPV (MISCELLANEOUS) ×4 IMPLANT
CANNULA VESSEL 3MM 2 BLNT TIP (CANNULA) ×8 IMPLANT
CLIP VESOCCLUDE MED 24/CT (CLIP) ×4 IMPLANT
CLIP VESOCCLUDE SM WIDE 24/CT (CLIP) ×4 IMPLANT
COVER WAND RF STERILE (DRAPES) IMPLANT
DERMABOND ADVANCED (GAUZE/BANDAGES/DRESSINGS) ×2
DERMABOND ADVANCED .7 DNX12 (GAUZE/BANDAGES/DRESSINGS) ×2 IMPLANT
ELECT BLADE 4.0 EZ CLEAN MEGAD (MISCELLANEOUS) ×4
ELECT BLADE 6.5 EXT (BLADE) ×4 IMPLANT
ELECT REM PT RETURN 9FT ADLT (ELECTROSURGICAL) ×4
ELECTRODE BLDE 4.0 EZ CLN MEGD (MISCELLANEOUS) ×2 IMPLANT
ELECTRODE REM PT RTRN 9FT ADLT (ELECTROSURGICAL) ×2 IMPLANT
FELT TEFLON 1X6 (MISCELLANEOUS) ×4 IMPLANT
GAUZE 4X4 16PLY RFD (DISPOSABLE) ×4 IMPLANT
GLOVE BIO SURGEON STRL SZ7.5 (GLOVE) ×8 IMPLANT
GLOVE BIOGEL PI IND STRL 6.5 (GLOVE) ×6 IMPLANT
GLOVE BIOGEL PI IND STRL 8 (GLOVE) ×2 IMPLANT
GLOVE BIOGEL PI IND STRL 8.5 (GLOVE) ×2 IMPLANT
GLOVE BIOGEL PI INDICATOR 6.5 (GLOVE) ×6
GLOVE BIOGEL PI INDICATOR 8 (GLOVE) ×2
GLOVE BIOGEL PI INDICATOR 8.5 (GLOVE) ×2
GLOVE ECLIPSE 6.5 STRL STRAW (GLOVE) ×12 IMPLANT
GLOVE ECLIPSE 8.5 STRL (GLOVE) ×4 IMPLANT
GOWN STRL REIN XL XLG (GOWN DISPOSABLE) ×4 IMPLANT
GOWN STRL REUS W/ TWL LRG LVL3 (GOWN DISPOSABLE) ×6 IMPLANT
GOWN STRL REUS W/TWL LRG LVL3 (GOWN DISPOSABLE) ×6
GRAFT HEMASHIELD 18X9MM (Vascular Products) ×4 IMPLANT
GRAFT VASC 22MMX15CM ×4 IMPLANT
HEMOSTAT HEMOBLAST BELLOWS (HEMOSTASIS) ×4 IMPLANT
INSERT FOGARTY 61MM (MISCELLANEOUS) ×8 IMPLANT
INSERT FOGARTY SM (MISCELLANEOUS) ×16 IMPLANT
KIT BASIN OR (CUSTOM PROCEDURE TRAY) ×4 IMPLANT
KIT TURNOVER KIT B (KITS) ×4 IMPLANT
MARKER SKIN DUAL TIP RULER LAB (MISCELLANEOUS) ×4 IMPLANT
NS IRRIG 1000ML POUR BTL (IV SOLUTION) ×8 IMPLANT
PACK AORTA (CUSTOM PROCEDURE TRAY) ×4 IMPLANT
PAD ARMBOARD 7.5X6 YLW CONV (MISCELLANEOUS) ×8 IMPLANT
RETAINER VISCERA MED (MISCELLANEOUS) ×4 IMPLANT
SPONGE SURGIFOAM ABS GEL 100 (HEMOSTASIS) IMPLANT
STAPLER VISISTAT (STAPLE) ×8 IMPLANT
SUT ETHIBOND 5 LR DA (SUTURE) IMPLANT
SUT PDS AB 1 TP1 54 (SUTURE) ×8 IMPLANT
SUT PROLENE 3 0 SH 48 (SUTURE) ×8 IMPLANT
SUT PROLENE 3 0 SH DA (SUTURE) ×4 IMPLANT
SUT PROLENE 5 0 C 1 24 (SUTURE) ×12 IMPLANT
SUT PROLENE 5 0 C 1 36 (SUTURE) ×8 IMPLANT
SUT SILK 2 0 SH CR/8 (SUTURE) ×4 IMPLANT
SUT VIC AB 2-0 CTB1 (SUTURE) ×8 IMPLANT
SUT VIC AB 3-0 SH 27 (SUTURE) ×4
SUT VIC AB 3-0 SH 27X BRD (SUTURE) ×4 IMPLANT
SUT VIC AB 4-0 PS2 18 (SUTURE) ×8 IMPLANT
TOWEL GREEN STERILE (TOWEL DISPOSABLE) ×4 IMPLANT
TOWEL SURG RFD BLUE STRL DISP (DISPOSABLE) ×8 IMPLANT
TRAY FOLEY MTR SLVR 16FR STAT (SET/KITS/TRAYS/PACK) ×4 IMPLANT
WATER STERILE IRR 1000ML POUR (IV SOLUTION) ×8 IMPLANT

## 2020-02-09 NOTE — Progress Notes (Signed)
  Day of Surgery Note    Subjective:  Significant post-op pain. PCA being set up. Nauseated earlier; improved with Zofran   Vitals:   02/09/20 1615 02/09/20 1626  BP:    Pulse: 71   Resp: 17 14  Temp: (!) 96.4 F (35.8 C)   SpO2: 95% 97%   Neuro: alert, oriented in NAD Incisions:   Midline/bil groin incisions without bleeding Extremities:  2+ DP pulses bilaterally.  Cardiac:  RRR Lungs:  nonlabored Abdomen:  Soft, ND UOP: approx 50cc/hr  Labs: ABGs: 7.35/39.9/103 Cr: 1.18, Hgb 13.4 Rads: CXR: good line position without ptx            KUB: NGT in body of stomach  Assessment/Plan:  This is a 79 y.o. male who is s/p aortobifemoral bypass secondary to AAA. Hemodynamically stable. Continue current care plan/monitoring. Wendi Maya, PA-C 02/09/2020 4:28 PM [phone]

## 2020-02-09 NOTE — Anesthesia Procedure Notes (Signed)
Central Venous Catheter Insertion Performed by: Oleta Mouse, MD, anesthesiologist Start/End6/15/2021 6:07 AM, 02/09/2020 6:17 AM Patient location: Pre-op. Preanesthetic checklist: patient identified, IV checked, site marked, risks and benefits discussed, surgical consent, monitors and equipment checked, pre-op evaluation, timeout performed and anesthesia consent Position: Trendelenburg Lidocaine 1% used for infiltration and patient sedated Hand hygiene performed , maximum sterile barriers used  and Seldinger technique used Catheter size: 9 Fr Total catheter length 10. Central line was placed.MAC introducer Swan type:thermodilution PA Cath depth:50 Procedure performed using ultrasound guided technique. Ultrasound Notes:anatomy identified, needle tip was noted to be adjacent to the nerve/plexus identified, no ultrasound evidence of intravascular and/or intraneural injection and image(s) printed for medical record Attempts: 1 Following insertion, line sutured, dressing applied and Biopatch. Post procedure assessment: blood return through all ports, free fluid flow and no air  Patient tolerated the procedure well with no immediate complications.

## 2020-02-09 NOTE — Transfer of Care (Signed)
Immediate Anesthesia Transfer of Care Note  Patient: Tony [name]  Procedure(s) Performed: ABDOMINAL AORTIC ANEURYSM REPAIR (N/A Abdomen) AORTIC ENDARTERECETOMY (N/A Abdomen) AORTA BI-ILIAC  BYPASS GRAFT (Bilateral Abdomen)  Patient Location: PACU  Anesthesia Type:General  Level of Consciousness: awake, drowsy and patient cooperative  Airway & Oxygen Therapy: Patient Spontanous Breathing and Patient connected to face mask oxygen  Post-op Assessment: Report given to RN, Post -op Vital signs reviewed and stable and Patient moving all extremities X 4  Post vital signs: Reviewed and stable  Last Vitals:  Vitals Value Taken Time  BP 140/86 02/09/20 1233  Temp    Pulse 82 02/09/20 1239  Resp 17 02/09/20 1239  SpO2 98 % 02/09/20 1239  Vitals shown include unvalidated device data.  Last Pain:  Vitals:   02/09/20 0618  TempSrc: Oral  PainSc: 0-No pain      Patients Stated Pain Goal: 5 (02/09/20 0618)  Complications: No complications documented.

## 2020-02-09 NOTE — Anesthesia Procedure Notes (Signed)
Central Venous Catheter Insertion Performed by: Val Eagle, MD, anesthesiologist Start/End6/15/2021 6:07 AM, 02/09/2020 6:17 AM Patient location: Pre-op. Preanesthetic checklist: patient identified, IV checked, site marked, risks and benefits discussed, surgical consent, monitors and equipment checked, pre-op evaluation, timeout performed and anesthesia consent Hand hygiene performed  and maximum sterile barriers used  PA cath was placed.Swan type:thermodilution Procedure performed without using ultrasound guided technique. Attempts: 1 Patient tolerated the procedure well with no immediate complications.

## 2020-02-09 NOTE — Anesthesia Procedure Notes (Signed)
Procedure Name: Intubation Date/Time: 02/09/2020 7:50 AM Performed by: Dorie Rank, CRNA Pre-anesthesia Checklist: Patient identified, Emergency Drugs available, Suction available and Patient being monitored Patient Re-evaluated:Patient Re-evaluated prior to induction Oxygen Delivery Method: Circle System Utilized Preoxygenation: Pre-oxygenation with 100% oxygen Induction Type: IV induction Ventilation: Mask ventilation without difficulty and Oral airway inserted - appropriate to patient size Laryngoscope Size: Hyacinth Meeker and 2 Grade View: Grade I Tube type: Oral Tube size: 7.5 mm Number of attempts: 1 Airway Equipment and Method: Stylet and Oral airway Placement Confirmation: ETT inserted through vocal cords under direct vision,  positive ETCO2 and breath sounds checked- equal and bilateral Secured at: 22.5 cm Tube secured with: Tape Dental Injury: Teeth and Oropharynx as per pre-operative assessment

## 2020-02-09 NOTE — Anesthesia Procedure Notes (Signed)
Arterial Line Insertion Start/End6/15/2021 7:11 AM, 02/09/2020 7:12 AM Performed by: Dorie Rank, CRNA, CRNA  Preanesthetic checklist: patient identified, IV checked, risks and benefits discussed, surgical consent, pre-op evaluation and timeout performed Lidocaine 1% used for infiltration Left, radial was placed Catheter size: 20 G Hand hygiene performed , maximum sterile barriers used  and Seldinger technique used Allen's test indicative of satisfactory collateral circulation Attempts: 1 Procedure performed without using ultrasound guided technique. Following insertion, Biopatch and dressing applied. Patient tolerated the procedure well with no immediate complications.

## 2020-02-09 NOTE — Anesthesia Postprocedure Evaluation (Signed)
Anesthesia Post Note  Patient: Tony [name]  Procedure(s) Performed: ABDOMINAL AORTIC ANEURYSM REPAIR (N/A Abdomen) AORTIC ENDARTERECETOMY (N/A Abdomen) AORTA BI-ILIAC  BYPASS GRAFT (Bilateral Abdomen)     Patient location during evaluation: PACU Anesthesia Type: General Level of consciousness: awake and alert Pain management: pain level controlled Vital Signs Assessment: post-procedure vital signs reviewed and stable Respiratory status: spontaneous breathing, nonlabored ventilation, respiratory function stable and patient connected to nasal cannula oxygen Cardiovascular status: blood pressure returned to baseline and stable Postop Assessment: no apparent nausea or vomiting Anesthetic complications: no   No complications documented.  Last Vitals:  Vitals:   02/09/20 0618 02/09/20 1218  BP: (!) 160/84 (!) 176/91  Pulse: 81 88  Resp: 18 (!) 21  Temp: 36.7 C   SpO2: 96% 96%    Last Pain:  Vitals:   02/09/20 1218  TempSrc:   PainSc: 4                  Estle Huguley DAVID

## 2020-02-09 NOTE — Anesthesia Procedure Notes (Signed)
Arterial Line Insertion Start/End6/15/2021 12:40 PM, 02/09/2020 12:40 PM Performed by: Dorie Rank, CRNA, CRNA  Preanesthetic checklist: patient identified, IV checked, risks and benefits discussed, surgical consent, pre-op evaluation and timeout performed Lidocaine 1% used for infiltration Right, radial was placed Catheter size: 20 G Hand hygiene performed , maximum sterile barriers used  and Seldinger technique used Allen's test indicative of satisfactory collateral circulation Attempts: 1 Procedure performed without using ultrasound guided technique. Following insertion, Biopatch and dressing applied. Post procedure assessment: normal  Patient tolerated the procedure well with no immediate complications. Additional procedure comments: Pt pulled out left radial arterial line in transport to PACU, held pressure, dressed and started new art line on right side.  Dr Edilia Bo aware and agrees patient needs for labs and arterial blood gases, Zigmund Gottron, CRNA.

## 2020-02-09 NOTE — Op Note (Signed)
NAME: Tony [name]    MRN: [id number] DOB: 1940/09/02    DATE OF OPERATION: 02/09/2020  PREOP DIAGNOSIS:    5.6 cm infrarenal abdominal aortic aneurysm  POSTOP DIAGNOSIS:    Same  PROCEDURE:    Aortic endarterectomy Repair of infrarenal abdominal aortic aneurysm with aortobiiliac bypass graft  SURGEON: Judeth Cornfield. Scot Dock, MD  ASSIST: Arlee Muslim, PA  ANESTHESIA: General  EBL: Per anesthesia record  INDICATIONS:    Tony [name] is a 79 y.o. male who presented with an enlarging abdominal aortic aneurysm that was now 5.6 cm.  It is gone from 5.2-5.6.  He was at increased risk because of his age and medical comorbidities however unfortunately he was not a candidate for endovascular approach given that he had significant thrombus in the neck.  Of note he also had a retroaortic left renal vein.  He had an accessory left renal also that supplied the lower pole of the kidney.  FINDINGS:   He had brisk Doppler signals in both feet at the completion of the procedure.  TECHNIQUE:   The patient was taken to the operating room and received a general anesthetic.  Monitoring lines have been placed by anesthesia.  The abdomen and groins were prepped and draped in usual sterile fashion.  The abdomen was entered through a midline incision.  Upon careful exploration no other intra-abdominal pathology was noted except for the large aneurysm.  The transverse colon was reflected superiorly.  The small bowel was reflected to the right.  The retroperitoneal tissue was divided.  Of note the patient had a enlarged proximal IMA with thrombus and I carefully dissected out this area and this was ligated to allow exposure of the aortic bifurcation.  There was significant calcific disease in the left common iliac artery with a large posterior plaque.  I was able to clamp the right side without much problem.  I did clip a couple of lumbars on the right side by mobilizing the aorta.  I then identified  both renal arteries and controlled the right renal artery with a loop.  The retroaortic renal vein was identified on the right side and controlled with a vessel loop.  On the left side was able to identify this.   Next the infrarenal aorta was controlled and the patient was heparinized.  Clamps were placed on both common iliac arteries.  I then placed a clamp just below the renal arteries.  The aneurysm was opened and there was significant lumbar bleeding which was controlled by oversewing the lumbars with 2-0 silk ties.  This required aortic endarterectomy in order to oversew the lumbar arteries.  I divided the aorta transversely well below the retroaortic renal vein to allow myself to expose the posterior wall of the aorta up to the level of the renal vein to be sure that I did not incorporate this in the anastomosis.  There was significant calcific plaque and chronic thrombus in the neck of the aneurysm and I had to endarterectomized this.  This was then irrigated with copious amounts of saline.  I selected a 18 x 9 bifurcated graft and also used a 22 mm Dacron cuff given that I done an aortic endarterectomy.  The graft was cut to the appropriate length and sewn into into the infrarenal aorta below the retroaortic renal vein with running 3-0 Prolene using a felt cuff.  After completing anastomosis the anastomosis was tested and was hemostatic and the graft was flushed and then the  infrarenal infrarenal aorta clamped again.  Attention was then turned to the right common iliac artery anastomosis.  I extended the distal aortotomy down onto both iliac arteries and then the right common iliac artery was divided.  The right limb of the graft was cut to the appropriate length spatulated and sewn into into the right common iliac artery using a running 5-0 Prolene suture.  Prior to completing this anastomosis the arteries were backbled and flushed appropriately and the anastomosis completed.  Flow was reestablished of  the right leg.  There was significant drop in pressure and he was resuscitated and tolerated this fine.  On the left side I dissected lower down on the common iliac artery and was able to get a clamp blower down so that I can endarterectomized the common iliac artery proximally.  There was excellent backbleeding.  I then cut the left limb of the graft the appropriate length and spatulated this this was then sewn into into the left common iliac artery using running 5-0 Prolene suture.  Prior to completing the anastomosis the arteries were backbled and flushed appropriately and the anastomosis completed.  Flow was reestablished to the left leg which the patient tolerated from a hemodynamic standpoint.  The heparin was partially reversed with protamine.  The anastomoses were all inspected and there was good hemostasis the proximal anastomosis.  I then slid the cuff up over the anastomosis onto the neck of the aneurysm.  The abdomen was irrigated with copious amounts of saline.  The aneurysm sac was closed over the graft in the midportion.  Of note, the aneurysm was somewhat balloon shaped so there was not much aortic wall to close over the proximal graft.  The retroperitoneal tissue was then closed with running 2-0 Vicryl.  I then ran the small bowel and explored the colon and there was no issues noted.  The abdominal contents were returned to their normal position.  The fascial layer was then closed with 2 running PDS sutures.  The subcutaneous layer was closed with running 3-0 Vicryl.  The skin was closed with 4-0 Vicryl.  Dermabond was applied.  Patient had good Doppler signals in both feet at the completion of the procedure.  He had good urine output throughout the case.  The patient tolerated the procedure well was transferred to the recovery room in stable condition.  All needle and sponge counts were correct.  [name], MD, FACS [facility]  DATE OF DICTATION:    02/09/2020

## 2020-02-09 NOTE — Interval H&P Note (Signed)
History and Physical Interval Note:  02/09/2020 7:15 AM  Tony [name]  has presented today for surgery, with the diagnosis of ABDOMINAL AORTIC ANEURYSM.  The various methods of treatment have been discussed with the patient and family. After consideration of risks, benefits and other options for treatment, the patient has consented to  Procedure(s): OPEN ABDOMINAL AORTIC ANEURYSM REPAIR (N/A) as a surgical intervention.  The patient's history has been reviewed, patient examined, no change in status, stable for surgery.  I have reviewed the patient's chart and labs.  Questions were answered to the patient's satisfaction.     [name]

## 2020-02-10 ENCOUNTER — Encounter (HOSPITAL_COMMUNITY): Payer: Self-pay | Admitting: Vascular Surgery

## 2020-02-10 ENCOUNTER — Inpatient Hospital Stay (HOSPITAL_COMMUNITY): Payer: Medicare HMO

## 2020-02-10 LAB — GLUCOSE, CAPILLARY
Glucose-Capillary: 166 mg/dL — ABNORMAL HIGH (ref 70–99)
Glucose-Capillary: 175 mg/dL — ABNORMAL HIGH (ref 70–99)
Glucose-Capillary: 155 mg/dL — ABNORMAL HIGH (ref 70–99)
Glucose-Capillary: 138 mg/dL — ABNORMAL HIGH (ref 70–99)

## 2020-02-10 LAB — COMPREHENSIVE METABOLIC PANEL
ALT: 15 U/L (ref 0–44)
AST: 22 U/L (ref 15–41)
Albumin: 3.6 g/dL (ref 3.5–5.0)
Alkaline Phosphatase: 39 U/L (ref 38–126)
Anion gap: 11 (ref 5–15)
BUN: 16 mg/dL (ref 8–23)
CO2: 21 mmol/L — ABNORMAL LOW (ref 22–32)
Calcium: 8.2 mg/dL — ABNORMAL LOW (ref 8.9–10.3)
Chloride: 108 mmol/L (ref 98–111)
Creatinine, Ser: 1.17 mg/dL (ref 0.61–1.24)
GFR calc Af Amer: >60 mL/min (ref >60)
GFR calc non Af Amer: 60 mL/min — ABNORMAL LOW (ref >60)
Glucose, Bld: 173 mg/dL — ABNORMAL HIGH (ref 70–99)
Potassium: 4.4 mmol/L (ref 3.5–5.1)
Sodium: 140 mmol/L (ref 135–145)
Total Bilirubin: 0.6 mg/dL (ref 0.3–1.2)
Total Protein: 6.2 g/dL — ABNORMAL LOW (ref 6.5–8.1)

## 2020-02-10 LAB — CBC
HCT: 41.4 % (ref 39.0–52.0)
Hemoglobin: 13.3 g/dL (ref 13.0–17.0)
MCH: 29.2 pg (ref 26.0–34.0)
MCHC: 32.1 g/dL (ref 30.0–36.0)
MCV: 91 fL (ref 80.0–100.0)
Platelets: 127 10*3/uL — ABNORMAL LOW (ref 150–400)
RBC: 4.55 MIL/uL (ref 4.22–5.81)
RDW: 13.6 % (ref 11.5–15.5)
WBC: 16.3 10*3/uL — ABNORMAL HIGH (ref 4.0–10.5)
nRBC: 0 % (ref 0.0–0.2)

## 2020-02-10 LAB — AMYLASE: Amylase: 85 U/L (ref 28–100)

## 2020-02-10 LAB — MAGNESIUM: Magnesium: 2.3 mg/dL (ref 1.7–2.4)

## 2020-02-10 MED ORDER — METOPROLOL TARTRATE 5 MG/5ML IV SOLN
5.0000 mg | Freq: Four times a day (QID) | INTRAVENOUS | Status: DC
  Administered 2020-02-10 – 2020-02-13 (×12): 5 mg via INTRAVENOUS
  Filled 2020-02-10 (×12): qty 5

## 2020-02-10 MED ORDER — SODIUM CHLORIDE 0.9 % IV SOLN: INTRAVENOUS | Status: AC

## 2020-02-10 MED ORDER — BISACODYL 10 MG RE SUPP
10.0000 mg | Freq: Once | RECTAL | Status: AC
  Administered 2020-02-10: 10 mg via RECTAL
  Filled 2020-02-10: qty 1

## 2020-02-10 MED ORDER — HEPARIN SODIUM (PORCINE) 5000 UNIT/ML IJ SOLN
5000.0000 [IU] | Freq: Three times a day (TID) | INTRAMUSCULAR | Status: DC
  Administered 2020-02-10 – 2020-02-12 (×8): 5000 [IU] via SUBCUTANEOUS
  Filled 2020-02-10 (×8): qty 1

## 2020-02-10 MED ORDER — ACETAMINOPHEN 10 MG/ML IV SOLN
1000.0000 mg | Freq: Four times a day (QID) | INTRAVENOUS | Status: AC
  Administered 2020-02-10 – 2020-02-11 (×4): 1000 mg via INTRAVENOUS
  Filled 2020-02-10 (×8): qty 100

## 2020-02-10 NOTE — Evaluation (Signed)
Occupational Therapy Evaluation Patient Details Name: Tony [name] MRN: [id number] DOB: November 23, 1940 Today's Date: 02/10/2020    History of Present Illness 79 yo male s/p AAA repair on 6/15. PMH includes former smoker, CAD s/p CABG 01/2013, DMII, HTN, HLD, PVD, L ICA occlusion, gout, lumbar surgery 2010, cervical spine surgery 2011, R reverse shoulder arthroplasty 2019.   Clinical Impression   This 79 y/o male presents with the above. PTA pt reports being very independent with ADL, iADL and functional mobility. Pt currently with limitations mostly due to post-op pain and generalized weakness. Pt tolerating OOB to recliner via two person assist for functional transfers [facility]), requiring up to maxA for LB ADL and minA for seated UB ADL. Pt requiring increased time for all aspects of mobility/functional tasks and with intermittent nausea with mobility today (reports improved once resting/seated in recliner). VSS throughout. He will benefit from continued acute OT services and currently recommend follow up [facility] services after discharge to maximize his overall safety and independence with ADL and mobility.     Follow Up Recommendations  Home health OT;Supervision/Assistance - 24 hour    Equipment Recommendations  None recommended by OT           Precautions / Restrictions Precautions Precautions: Fall Precaution Comments: midline abdominal incision - pt instructed in log roll technique; multiple lines/leads Restrictions Weight Bearing Restrictions: No      Mobility Bed Mobility Overal bed mobility: Needs Assistance Bed Mobility: Rolling;Sidelying to Sit Rolling: Min assist Sidelying to sit: Mod assist;+2 for physical assistance;+2 for safety/equipment;HOB elevated       General bed mobility comments: Min assist for roll to R for trunk translation, Mod +2 for sidelying to sit for LE lowering off EOB and trunk elevation/steadying. Pt able to scoot to EOB with very increased time and  effort. Pt reporting dizziness sitting EOB, BP not orthostatic.  Transfers Overall transfer level: Needs assistance Equipment used: 2 person hand held assist Transfers: Sit to/from [employer] Sit to Stand: Mod assist;+2 physical assistance;+2 safety/equipment Stand pivot transfers: Min assist;+2 physical assistance;+2 safety/equipment       General transfer comment: Mod +2 for sit to stand for power up, hip extension via posterior facilitation, steadying upon standing, and assuming full upright posture although pt limited by abdominal pain. Min +2 for steadying and guiding to recliner at bedside via small pivotal steps.    Balance Overall balance assessment: Needs assistance Sitting-balance support: No upper extremity supported;Feet supported Sitting balance-Leahy Scale: Good     Standing balance support: Bilateral upper extremity supported;During functional activity Standing balance-Leahy Scale: Fair Standing balance comment: able to stand statically without external support, requires external assist from PT/OT during dynamic standing                           ADL either performed or assessed with clinical judgement   ADL                                                                   Pertinent Vitals/Pain Pain Assessment: Faces Faces Pain Scale: Hurts even more Pain Location: abdomen Pain Descriptors / Indicators: Sore;Discomfort;Guarding;Operative site guarding Pain Intervention(s): Limited activity within patient's tolerance;Monitored during session;PCA encouraged;Repositioned  Hand Dominance Left   Extremity/Trunk Assessment Upper Extremity Assessment Upper Extremity Assessment: Overall WFL for tasks assessed   Lower Extremity Assessment Lower Extremity Assessment: Defer to PT evaluation   Cervical / Trunk Assessment Cervical / Trunk Assessment: Kyphotic;Other exceptions Cervical / Trunk Exceptions:  forward flexed posture in standing due to abdominal pain   Communication Communication Communication: No difficulties   Cognition Arousal/Alertness: Awake/alert Behavior During Therapy: WFL for tasks assessed/performed Overall Cognitive Status: Within Functional Limits for tasks assessed                                 General Comments: Pt talkative, can be tangential but appropriate   General Comments       Exercises     Shoulder Instructions      Home Living Family/patient expects to be discharged to:: Private residence Living Arrangements: Spouse/significant other Available Help at Discharge: Family Type of Home: Mobile home Home Access: Stairs to enter Technical brewer of Steps: 3 Entrance Stairs-Rails: Right;Left;Can reach both [place]: One level     Bathroom Shower/Tub: Occupational psychologist: Handicapped height     [place]: [place] - built in;Grab bars - tub/shower;Walker - 2 wheels;Crutches;Cane - single point          Prior Functioning/Environment Level of Independence: Independent        Comments: 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 Pt has 8 chickens, dog with wife.        OT Problem List: Decreased strength;Decreased range of motion;Decreased activity tolerance;Pain;Impaired balance (sitting and/or standing);Decreased safety awareness;Decreased knowledge of use of DME or AE;Decreased knowledge of precautions      OT Treatment/Interventions: Self-care/ADL training;Therapeutic exercise;Energy conservation;DME and/or AE instruction;Therapeutic activities;Patient/family education;Balance training    OT Goals(Current goals can be found in the care plan section) Acute Rehab OT Goals Patient Stated Goal: return to independence and active lifestyle OT Goal Formulation: With patient Time For Goal Achievement: 02/24/20 Potential to Achieve Goals: Good  OT Frequency: Min  2X/week   Barriers to D/C:            Co-evaluation PT/OT/SLP Co-Evaluation/Treatment: Yes Reason for Co-Treatment: For patient/therapist safety;To address functional/ADL transfers PT goals addressed during session: Mobility/safety with mobility OT goals addressed during session: Strengthening/ROM;ADL's and self-care      AM-PAC OT "6 Clicks" Daily Activity     Outcome Measure Help from another person eating meals?: A Little Help from another person taking care of personal grooming?: A Little Help from another person toileting, which includes using toliet, bedpan, or urinal?: A Lot Help from another person bathing (including washing, rinsing, drying)?: A Lot Help from another person to put on and taking off regular upper body clothing?: A Little Help from another person to put on and taking off regular lower body clothing?: A Lot 6 Click Score: 15   End of Session Nurse Communication: Mobility status  Activity Tolerance: Patient tolerated treatment well;Patient limited by pain Patient left: in chair;with call bell/phone within reach;with chair alarm set  OT Visit Diagnosis: Other abnormalities of gait and mobility (R26.89);Pain Pain - part of body:  (abdomen)                Time: 1245-8099 OT Time Calculation (min): 32 min Charges:  OT General Charges $OT Visit: 1 Visit OT Evaluation $OT Eval Moderate Complexity: 1 Mod  Lou Cal, OT Acute  Rehabilitation Services Pager [phone] Office [phone]   Orlando Penner 02/10/2020, 12:36 PM

## 2020-02-10 NOTE — Progress Notes (Signed)
Pt arrived from [facility] to 4E room 21 after AAA repair 6/15 with Dr. Edilia Bo.  Telemetry monitor applied and CCMD notified.  CHG bath and skin assessment completed.  Pt oriented to unit and room to include call light and phone.  Will continue to monitor.

## 2020-02-10 NOTE — Evaluation (Signed)
Physical Therapy Evaluation Patient Details Name: Tony [name] MRN: [id number] DOB: 1940-12-19 Today's Date: 02/10/2020   History of Present Illness  79 yo male s/p AAA repair on 6/15. PMH includes former smoker, CAD s/p CABG 01/2013, DMII, HTN, HLD, PVD, L ICA occlusion, gout, lumbar surgery 2010, cervical spine surgery 2011, R reverse shoulder arthroplasty 2019.  Clinical Impression   Pt presents with severe abdominal pain, generalized weakness, impaired ability to perform bed mobility and transfers post-surgically, and decreased activity tolerance. Pt to benefit from acute PT to address deficits. Pt required min-mod +2 for bed mobility and transfer to recliner at bedside, further mobility limited by severe abdominal pain and dizziness/nausea with mobility. Once pain is controlled post-operatively, PT expects pt to progress well with mobility, recommending HHPT to address mobility deficits post-acutely. VSS during mobility this day. PT to progress mobility as tolerated, and will continue to follow acutely.      Follow Up Recommendations Home health PT;Supervision/Assistance - 24 hour    Equipment Recommendations  None recommended by PT    Recommendations for Other Services       Precautions / Restrictions Precautions Precautions: Fall Precaution Comments: midline abdominal incision - pt instructed in log roll technique; multiple lines/leads Restrictions Weight Bearing Restrictions: No      Mobility  Bed Mobility Overal bed mobility: Needs Assistance Bed Mobility: Rolling;Sidelying to Sit Rolling: Min assist Sidelying to sit: Mod assist;+2 for physical assistance;+2 for safety/equipment;HOB elevated       General bed mobility comments: Min assist for roll to R for trunk translation, Mod +2 for sidelying to sit for LE lowering off EOB and trunk elevation/steadying. Pt able to scoot to EOB with very increased time and effort. Pt reporting dizziness sitting EOB, BP not  orthostatic.  Transfers Overall transfer level: Needs assistance Equipment used: 2 person hand held assist Transfers: Sit to/from [employer] Sit to Stand: Mod assist;+2 physical assistance;+2 safety/equipment Stand pivot transfers: Min assist;+2 physical assistance;+2 safety/equipment       General transfer comment: Mod +2 for sit to stand for power up, hip extension via posterior facilitation, steadying upon standing, and assuming full upright posture although pt limited by abdominal pain. Min +2 for steadying and guiding to recliner at bedside via small pivotal steps.  Ambulation/Gait             General Gait Details: unable this day, limited by pain  Stairs            Wheelchair Mobility    Modified Rankin (Stroke Patients Only)       Balance Overall balance assessment: Needs assistance Sitting-balance support: No upper extremity supported;Feet supported Sitting balance-Leahy Scale: Good     Standing balance support: Bilateral upper extremity supported;During functional activity Standing balance-Leahy Scale: Fair Standing balance comment: able to stand statically without external support, requires external assist from PT/OT during dynamic standing                             Pertinent Vitals/Pain Pain Assessment: Faces Faces Pain Scale: Hurts whole lot Pain Location: abdomen Pain Descriptors / Indicators: Sore;Discomfort;Guarding;Operative site guarding Pain Intervention(s): Limited activity within patient's tolerance;Repositioned;PCA encouraged;Monitored during session;Premedicated before session    Home Living Family/patient expects to be discharged to:: Private residence Living Arrangements: Spouse/significant other Available Help at Discharge: Family Type of Home: Mobile home Home Access: Stairs to enter Entrance Stairs-Rails: Right;Left;Can reach both Entrance Stairs-Number of Steps: 3 Home Layout:  One level Home  Equipment: Shower seat - built in;Grab bars - tub/shower;Walker - 2 wheels;Crutches;Cane - single point      Prior Function Level of Independence: Independent         Comments: 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 Pt has 8 chickens, dog with wife.     Hand Dominance   Dominant Hand: Left    Extremity/Trunk Assessment   Upper Extremity Assessment Upper Extremity Assessment: Defer to OT evaluation    Lower Extremity Assessment Lower Extremity Assessment: Generalized weakness    Cervical / Trunk Assessment Cervical / Trunk Assessment: Kyphotic;Other exceptions Cervical / Trunk Exceptions: forward flexed posture in standing due to abdominal pain  Communication   Communication: No difficulties  Cognition Arousal/Alertness: Awake/alert Behavior During Therapy: WFL for tasks assessed/performed Overall Cognitive Status: Within Functional Limits for tasks assessed                                 General Comments: Pt talkative, can be tangential but appropriate      General Comments      Exercises     Assessment/Plan    PT Assessment Patient needs continued PT services  PT Problem List Decreased strength;Decreased mobility;Decreased activity tolerance;Decreased balance;Decreased knowledge of use of DME;Pain;Decreased safety awareness       PT Treatment Interventions DME instruction;Therapeutic activities;Gait training;Therapeutic exercise;Patient/family education;Balance training;Stair training;Functional mobility training;Neuromuscular re-education    PT Goals (Current goals can be found in the Care Plan section)  Acute Rehab PT Goals Patient Stated Goal: return to independence and active lifestyle PT Goal Formulation: With patient Time For Goal Achievement: 02/24/20 Potential to Achieve Goals: Good    Frequency Min 3X/week   Barriers to discharge        Co-evaluation PT/OT/SLP Co-Evaluation/Treatment:  Yes Reason for Co-Treatment: To address functional/ADL transfers;For patient/therapist safety PT goals addressed during session: Mobility/safety with mobility         AM-PAC PT "6 Clicks" Mobility  Outcome Measure Help needed turning from your back to your side while in a flat bed without using bedrails?: A Little Help needed moving from lying on your back to sitting on the side of a flat bed without using bedrails?: A Lot Help needed moving to and from a bed to a chair (including a wheelchair)?: A Lot Help needed standing up from a chair using your arms (e.g., wheelchair or bedside chair)?: A Lot Help needed to walk in hospital room?: A Lot Help needed climbing 3-5 steps with a railing? : Total 6 Click Score: 12    End of Session Equipment Utilized During Treatment: Gait belt;Oxygen (O2 off during session, reapplied post-transfer) Activity Tolerance: Patient tolerated treatment well Patient left: in chair;with chair alarm set;with call bell/phone within reach Nurse Communication: Mobility status PT Visit Diagnosis: Other abnormalities of gait and mobility (R26.89);Muscle weakness (generalized) (M62.81);Difficulty in walking, not elsewhere classified (R26.2);Pain Pain - Right/Left:  (mid) Pain - part of body:  (abdomen)    Time: 3976-7341 PT Time Calculation (min) (ACUTE ONLY): 31 min   Charges:   PT Evaluation $PT Eval Low Complexity: 1 Low        Azarian Starace E, PT Acute Rehabilitation Services Pager [phone]  Office [phone]   Gurkaran Rahm D Lunette Tapp 02/10/2020, 10:15 AM

## 2020-02-10 NOTE — Progress Notes (Signed)
° °  VASCULAR SURGERY ASSESSMENT & PLAN:   POD 1 S/P OPEN REPAIR AAA: The patient is hemodynamically stable.  He has intermittent episodes of nausea.  He has palpable pedal pulses.  CARDIAC: Hemodynamically stable.  DC [place].  PULMONARY: Good oxygenation, encourage I-S  RENAL: His renal function is normal.  The clamp was infrarenal.  GI/ NUTRITION: Minimal NG tube drainage will discontinue  VQI: Will restart his statin when he is taking po.  He is allergic to aspirin.  DVT PROPHYLAXIS: He is on subcu heparin.  Transfer to 4 E.  SUBJECTIVE:   He has intermittent episodes of nausea.  Otherwise pain is controlled with PCA  PHYSICAL EXAM:   Vitals:   02/10/20 0100 02/10/20 0200 02/10/20 0350 02/10/20 0400  BP:    (!) 153/87  Pulse:      Resp: 14 13 16 16   Temp: (!) 97.5 F (36.4 C) (!) 97.5 F (36.4 C) 97.7 F (36.5 C) 97.7 F (36.5 C)  TempSrc:    Core (Comment)  SpO2: 98% 99% 96% 96%  Weight:      Height:       Abdomen with hypoactive bowel sounds Lungs are clear. Palpable pedal pulses.  LABS:   Lab Results  Component Value Date   WBC 16.3 (H) 02/10/2020   HGB 13.3 02/10/2020   HCT 41.4 02/10/2020   MCV 91.0 02/10/2020   PLT 127 (L) 02/10/2020   Lab Results  Component Value Date   CREATININE 1.18 02/09/2020   Lab Results  Component Value Date   INR 1.1 02/09/2020   CBG (last 3)  Recent Labs    02/09/20 0609 02/09/20 1221 02/09/20 1645  GLUCAP 144* 170* 203*    PROBLEM LIST:    Active Problems:   AAA (abdominal aortic aneurysm) (HCC)   CURRENT MEDS:    Chlorhexidine Gluconate Cloth  6 each Topical Daily   docusate sodium  100 mg Oral Daily   HYDROmorphone   Intravenous Q4H   insulin aspart  0-15 Units Subcutaneous TID WC   mouth rinse  15 mL Mouth Rinse BID   pantoprazole (PROTONIX) IV  40 mg Intravenous Q24H   [START ON 02/11/2020] pneumococcal 23 valent vaccine  0.5 mL Intramuscular Tomorrow-1000    02/13/2020 Office:  [phone] 02/10/2020

## 2020-02-11 LAB — GLUCOSE, CAPILLARY
Glucose-Capillary: 160 mg/dL — ABNORMAL HIGH (ref 70–99)
Glucose-Capillary: 182 mg/dL — ABNORMAL HIGH (ref 70–99)
Glucose-Capillary: 141 mg/dL — ABNORMAL HIGH (ref 70–99)
Glucose-Capillary: 177 mg/dL — ABNORMAL HIGH (ref 70–99)

## 2020-02-11 LAB — CBC
HCT: 33.6 % — ABNORMAL LOW (ref 39.0–52.0)
Hemoglobin: 10.7 g/dL — ABNORMAL LOW (ref 13.0–17.0)
MCH: 29.4 pg (ref 26.0–34.0)
MCHC: 31.8 g/dL (ref 30.0–36.0)
MCV: 92.3 fL (ref 80.0–100.0)
Platelets: 102 10*3/uL — ABNORMAL LOW (ref 150–400)
RBC: 3.64 MIL/uL — ABNORMAL LOW (ref 4.22–5.81)
RDW: 13.6 % (ref 11.5–15.5)
WBC: 13.7 10*3/uL — ABNORMAL HIGH (ref 4.0–10.5)
nRBC: 0 % (ref 0.0–0.2)

## 2020-02-11 MED FILL — Sodium Chloride Irrigation Soln 0.9%: Qty: 3000 | Status: AC

## 2020-02-11 MED FILL — Sodium Chloride IV Soln 0.9%: INTRAVENOUS | Qty: 1000 | Status: AC

## 2020-02-11 MED FILL — Heparin Sodium (Porcine) Inj 1000 Unit/ML: INTRAMUSCULAR | Qty: 30 | Status: AC

## 2020-02-11 NOTE — Progress Notes (Signed)
Alfredo Bach RN aware of order to d/c central line

## 2020-02-11 NOTE — Progress Notes (Signed)
Right 2L IJ removed per MD order without difficulty.  Vital signs cycling and pt educated on bedrest.  Will continue to monitor.

## 2020-02-11 NOTE — Progress Notes (Addendum)
Patient complaining of severe abdominal pain, constipation and not passing gas. Patient requesting medication to encourage BM. MD paged. Dr. Myra Gianotti ordered Dulcolax suppository. Suppository administered. Will continue to monitor.   02/11/20 0250 Patient has had small bowel movements and is passing gas. Patient states the abdominal pain and pressure is beginning to decrease. Will continue to monitor.   Willa Frater, RN

## 2020-02-11 NOTE — Progress Notes (Signed)
   VASCULAR SURGERY ASSESSMENT & PLAN:   POD 2 S/P OPEN REPAIR AAA:  Overall doing well.  PULMONARY: Ambulate. Encourage IS.   RENAL: His renal function is normal.  D/C Foley.   GI/ NUTRITION: His bowel function has returned.  We will start full liquids.  Advance diet as tolerated.  May restart po meds when tolerating p.o.  VQI: Restart his statin when he is taking po.  He is allergic to aspirin.  DVT PROPHYLAXIS: He is on subcu heparin.  Anticipate discharge possibly Saturday.   SUBJECTIVE:   He had a bowel movement.  He is passing flatus.  No nausea.  He is hungry.  Pain is well controlled.  PHYSICAL EXAM:   Vitals:   02/11/20 0600 02/11/20 0656 02/11/20 0700 02/11/20 0800  BP:  (!) 179/82  (!) 167/81  Pulse:  70  69  Resp: 16  13 12   Temp:    98.7 F (37.1 C)  TempSrc:      SpO2:    96%  Weight:      Height:       Abdomen has normal pitched bowel sounds. Lungs are clear. Palpable dorsalis pedis pulses.  LABS:   Lab Results  Component Value Date   WBC 13.7 (H) 02/11/2020   HGB 10.7 (L) 02/11/2020   HCT 33.6 (L) 02/11/2020   MCV 92.3 02/11/2020   PLT 102 (L) 02/11/2020   Lab Results  Component Value Date   CREATININE 1.17 02/10/2020   CBG (last 3)  Recent Labs    02/10/20 1618 02/10/20 2153 02/11/20 0633  GLUCAP 155* 138* 160*    PROBLEM LIST:    Active Problems:   AAA (abdominal aortic aneurysm) (HCC)   CURRENT MEDS:   . Chlorhexidine Gluconate Cloth  6 each Topical Daily  . docusate sodium  100 mg Oral Daily  . heparin injection (subcutaneous)  5,000 Units Subcutaneous Q8H  . insulin aspart  0-15 Units Subcutaneous TID WC  . mouth rinse  15 mL Mouth Rinse BID  . metoprolol tartrate  5 mg Intravenous Q6H  . pantoprazole (PROTONIX) IV  40 mg Intravenous Q24H  . pneumococcal 23 valent vaccine  0.5 mL Intramuscular Tomorrow-1000    02/13/20 Office: [phone] 02/11/2020

## 2020-02-11 NOTE — Progress Notes (Signed)
Pt encouraged repeatedly by both mobility tech and RN to walk throughout the shift.  Pt repeatedly refused to walk with staff other than walking to the bathroom.

## 2020-02-11 NOTE — Progress Notes (Signed)
Pt ambulated to bathroom, use walker steady gait. Pt was able to move from seating position to laying with minimal assistance

## 2020-02-11 NOTE — Progress Notes (Signed)
Mobility Specialist: Progress Note   02/11/20 1615  Mobility  Activity Refused mobility  Mobility performed by Mobility specialist   Pt refused mobility b/c of pain in abdomen.   [facility]to Mobility Specialist

## 2020-02-12 LAB — GLUCOSE, CAPILLARY
Glucose-Capillary: 152 mg/dL — ABNORMAL HIGH (ref 70–99)
Glucose-Capillary: 146 mg/dL — ABNORMAL HIGH (ref 70–99)
Glucose-Capillary: 130 mg/dL — ABNORMAL HIGH (ref 70–99)
Glucose-Capillary: 120 mg/dL — ABNORMAL HIGH (ref 70–99)

## 2020-02-12 LAB — TYPE AND SCREEN
ABO/RH(D): O POS
Antibody Screen: NEGATIVE
Unit division: 0
Unit division: 0
Unit division: 0
Unit division: 0

## 2020-02-12 LAB — BPAM RBC
Blood Product Expiration Date: 202107102359
Blood Product Expiration Date: 202107102359
Blood Product Expiration Date: 202107122359
Blood Product Expiration Date: 202107122359
ISSUE DATE / TIME: 202106150736
ISSUE DATE / TIME: 202106150736
Unit Type and Rh: 5100
Unit Type and Rh: 5100
Unit Type and Rh: 5100
Unit Type and Rh: 5100

## 2020-02-12 MED ORDER — BENAZEPRIL HCL 5 MG PO TABS
10.0000 mg | ORAL_TABLET | Freq: Two times a day (BID) | ORAL | Status: DC
  Administered 2020-02-12 – 2020-02-13 (×3): 10 mg via ORAL
  Filled 2020-02-12 (×3): qty 2

## 2020-02-12 MED ORDER — PANTOPRAZOLE SODIUM 40 MG PO TBEC
40.0000 mg | DELAYED_RELEASE_TABLET | Freq: Two times a day (BID) | ORAL | Status: DC
  Administered 2020-02-12 (×2): 40 mg via ORAL
  Filled 2020-02-12 (×3): qty 1

## 2020-02-12 MED ORDER — SIMVASTATIN 20 MG PO TABS
20.0000 mg | ORAL_TABLET | Freq: Every day | ORAL | Status: DC
  Filled 2020-02-12: qty 1

## 2020-02-12 NOTE — Progress Notes (Signed)
Mobility Specialist: Progress Note    02/12/20 1507  Mobility  Activity Ambulated to bathroom  Level of Assistance Standby assist, set-up cues, supervision of patient - no hands on  Assistive Device None  Distance Ambulated (ft) 40 ft  Mobility Response Tolerated well  Mobility performed by Mobility specialist  $Mobility charge 1 Mobility     Development worker, international aid

## 2020-02-12 NOTE — Progress Notes (Addendum)
  Progress Note    02/12/2020 7:29 AM 3 Days Post-Op  Subjective: Bowel movement yesterday but none last night or this morning.  He tolerated liquid diet.  Would like to eat something more substantial.   Vitals:   02/12/20 0542 02/12/20 0604  BP: (!) 185/83 (!) 164/78  Pulse: 78   Resp: 18 19  Temp:    SpO2:     Physical Exam: Lungs:  Non labored Incisions:  abd incision c/d/i Extremities:  Palpable DP pulses L>R Abdomen:  Soft, ND, tenderness around incision Neurologic: A&O  CBC    Component Value Date/Time   WBC 13.7 (H) 02/11/2020 0603   RBC 3.64 (L) 02/11/2020 0603   HGB 10.7 (L) 02/11/2020 0603   HCT 33.6 (L) 02/11/2020 0603   PLT 102 (L) 02/11/2020 0603   MCV 92.3 02/11/2020 0603   MCH 29.4 02/11/2020 0603   MCHC 31.8 02/11/2020 0603   RDW 13.6 02/11/2020 0603    BMET    Component Value Date/Time   NA 140 02/10/2020 0347   K 4.4 02/10/2020 0347   CL 108 02/10/2020 0347   CO2 21 (L) 02/10/2020 0347   GLUCOSE 173 (H) 02/10/2020 0347   BUN 16 02/10/2020 0347   CREATININE 1.17 02/10/2020 0347   CALCIUM 8.2 (L) 02/10/2020 0347   GFRNONAA 60 (L) 02/10/2020 0347   GFRAA >60 02/10/2020 0347    INR    Component Value Date/Time   INR 1.1 02/09/2020 1248     Intake/Output Summary (Last 24 hours) at 02/12/2020 0729 Last data filed at 02/12/2020 5465 Gross per 24 hour  Intake 240 ml  Output 2520 ml  Net -2280 ml     Assessment/Plan:  79 y.o. male is s/p open AAA repair with aortobi-iliac graft 3 Days Post-Op   BLE well perfused with palpable DP pulses Tolerating full liquids; will advance to regular diet for this evening Restart statin TOC team consulted for [facility] PT Possible discharge tomorrow  DVT prophylaxis:  subq heparin   Emilie Rutter, PA-C [facility] [phone] 02/12/2020 7:29 AM  I have interviewed the patient and examined the patient. I agree with the findings by the PA.  Advance to regular diet.  Restart home  meds.  Anticipate discharge tomorrow.Cari Caraway, MD [phone]

## 2020-02-12 NOTE — Progress Notes (Signed)
Physical Therapy Treatment Patient Details Name: Tony [name] MRN: [id number] DOB: February 07, 1941 Today's Date: 02/12/2020    History of Present Illness 79 yo male s/p AAA repair on 6/15. PMH includes former smoker, CAD s/p CABG 01/2013, DMII, HTN, HLD, PVD, L ICA occlusion, gout, lumbar surgery 2010, cervical spine surgery 2011, R reverse shoulder arthroplasty 2019.    PT Comments    Pt with significantly progressed mobility today, ambulating hallway distance with use of RW and min guard to supervision for safety. Multimodal cuing required for upright posture and closer proximity to RW during gait, pt limited by post-surgical abdominal pain. PT to continue to follow acutely, progressing well at this time.   HR 80s-90s during mobility    Follow Up Recommendations  Home health PT;Supervision/Assistance - 24 hour     Equipment Recommendations  None recommended by PT    Recommendations for Other Services       Precautions / Restrictions Precautions Precautions: Fall Precaution Comments: midline abdominal incision - verbally reviewed log roll technique, pt up in chair upon PT arrival Restrictions Weight Bearing Restrictions: No    Mobility  Bed Mobility               General bed mobility comments: up in chair  Transfers Overall transfer level: Needs assistance Equipment used: Rolling walker (2 wheeled) Transfers: Sit to/from Stand Sit to Stand: Min guard         General transfer comment: for safety, increased time to rise to upright due to abdominal discomfort.  Ambulation/Gait Ambulation/Gait assistance: Supervision;Min guard Gait Distance (Feet): 300 Feet Assistive device: Rolling walker (2 wheeled) Gait Pattern/deviations: Step-through pattern;Decreased stride length;Trunk flexed Gait velocity: decr   General Gait Details: min guard initially, transitioning to supervision for safety. Verbal cuing for upright posture and bringing RW closer during gait.  VSS   Stairs             Wheelchair Mobility    Modified Rankin (Stroke Patients Only)       Balance Overall balance assessment: Needs assistance Sitting-balance support: No upper extremity supported;Feet supported Sitting balance-Leahy Scale: Good     Standing balance support: No upper extremity supported Standing balance-Leahy Scale: Fair Standing balance comment: able to stand statically without external support                            Cognition Arousal/Alertness: Awake/alert Behavior During Therapy: WFL for tasks assessed/performed Overall Cognitive Status: Within Functional Limits for tasks assessed                                        Exercises      General Comments        Pertinent Vitals/Pain Pain Assessment: 0-10 Pain Score: 5  Pain Location: abdomen Pain Descriptors / Indicators: Sore;Discomfort;Guarding;Operative site guarding Pain Intervention(s): Limited activity within patient's tolerance;Monitored during session;Repositioned    Home Living                      Prior Function            PT Goals (current goals can now be found in the care plan section) Acute Rehab PT Goals Patient Stated Goal: return to independence and active lifestyle PT Goal Formulation: With patient Time For Goal Achievement: 02/24/20 Potential to Achieve Goals: Good Progress towards PT goals:  Progressing toward goals    Frequency    Min 3X/week      PT Plan Current plan remains appropriate    Co-evaluation              AM-PAC PT "6 Clicks" Mobility   Outcome Measure  Help needed turning from your back to your side while in a flat bed without using bedrails?: A Little Help needed moving from lying on your back to sitting on the side of a flat bed without using bedrails?: A Little Help needed moving to and from a bed to a chair (including a wheelchair)?: A Little Help needed standing up from a chair using  your arms (e.g., wheelchair or bedside chair)?: A Little Help needed to walk in hospital room?: A Little Help needed climbing 3-5 steps with a railing? : A Lot 6 Click Score: 17    End of Session Equipment Utilized During Treatment: Gait belt Activity Tolerance: Patient tolerated treatment well Patient left: in chair;with call bell/phone within reach;with family/visitor present Nurse Communication: Mobility status PT Visit Diagnosis: Other abnormalities of gait and mobility (R26.89);Muscle weakness (generalized) (M62.81);Difficulty in walking, not elsewhere classified (R26.2);Pain Pain - Right/Left:  (mid) Pain - part of body:  (abdomen)     Time: 6629-4765 PT Time Calculation (min) (ACUTE ONLY): 13 min  Charges:  $Gait Training: 8-22 mins                     [name] E, PT Acute Rehabilitation Services Pager [phone]  Office [phone]    [name] 02/12/2020, 12:27 PM

## 2020-02-12 NOTE — Progress Notes (Signed)
Patient has been concerned about BM's. Per PM RN he has been having loose stools after laxative. Patient still felt he would need stool softener. Patient had urgency and accident in route to bathroom. Educated patient on need to hold off on stool softeners and laxatives. Pt resting with call bell within reach.  Will continue to monitor.

## 2020-02-12 NOTE — TOC Initial Note (Signed)
Transition of Care (TOC) - Initial/Assessment Note  Donn Pierini RN, BSN Transitions of Care Unit 4E- RN Case Manager [phone]   Patient Details  Name: Tony [name] MRN: [id number] Date of Birth: 02-Jan-1941  Transition of Care [facility]) CM/SW Contact:    Darrold Span, RN Phone Number: 02/12/2020, 12:15 PM  Clinical Narrative:                 Pt s/p AAA repair, from home with spouse, order placed for HHPT, RNCM spoke with pt and wife at bedside regarding recommendation for [facility] therapy - per pt he states he does not feel like he will need HH - states he has a RW and plans to walk around when he gets home. List provided for choice Per CMS guidelines from medicare.gov website with star ratings (copy placed in shadow chart)- and explained should pt change his mind - services could be set up per his choice prior to discharge just to let staff know.  At this time pt politely declining HH referral.   Expected Discharge Plan: Home w Home Health Services Barriers to Discharge: Continued Medical Work up   Patient Goals and CMS Choice Patient states their goals for this hospitalization and ongoing recovery are:: return home and walk around outside [facility] Medicare.gov Compare Post Acute Care list provided to:: Patient Choice offered to / list presented to : Patient  Expected Discharge Plan and Services Expected Discharge Plan: Home w Home Health Services   Discharge Planning Services: CM Consult Post Acute Care Choice: Home Health Living arrangements for the past 2 months: Single Family Home                           HH Arranged: PT, Patient Refused HH          Prior Living Arrangements/Services Living arrangements for the past 2 months: Single Family Home Lives with:: Spouse Patient language and need for interpreter reviewed:: Yes        Need for Family Participation in Patient Care: Yes (Comment) Care giver support system in place?: Yes (comment) Current home  services: DME (walker) Criminal Activity/Legal Involvement Pertinent to Current Situation/Hospitalization: No - Comment as needed  Activities of Daily Living Home Assistive Devices/Equipment: Eyeglasses, Dentures (specify type), CBG Meter ADL Screening (condition at time of admission) Patient's cognitive ability adequate to safely complete daily activities?: Yes Is the patient deaf or have difficulty hearing?: No Does the patient have difficulty seeing, even when wearing glasses/contacts?: No Does the patient have difficulty concentrating, remembering, or making decisions?: No Patient able to express need for assistance with ADLs?: Yes Does the patient have difficulty dressing or bathing?: No Independently performs ADLs?: Yes (appropriate for developmental age) Does the patient have difficulty walking or climbing stairs?: No Weakness of Legs: None Weakness of Arms/Hands: None  Permission Sought/Granted   Permission granted to share information with : No              Emotional Assessment Appearance:: Appears stated age Attitude/Demeanor/Rapport: Engaged Affect (typically observed): Appropriate, Pleasant Orientation: : Oriented to Self, Oriented to Place, Oriented to  Time, Oriented to Situation Alcohol / Substance Use: Not Applicable Psych Involvement: No (comment)  Admission diagnosis:  AAA (abdominal aortic aneurysm) (HCC) [I71.4] Patient Active Problem List   Diagnosis Date Noted  . AAA (abdominal aortic aneurysm) (HCC) 02/09/2020  . Carotid stenosis, asymptomatic, bilateral 02/01/2020  . AAA (abdominal aortic aneurysm) without rupture (HCC) 01/31/2020  .  Preop cardiovascular exam 01/31/2020  . Educated about COVID-19 virus infection 08/30/2019  . Rotator cuff tear arthropathy, right 06/25/2018  . Bilateral carotid artery disease ([place]) 02/06/2017  . Type 2 diabetes mellitus with complication, without long-term current use of insulin ([place]) 02/06/2017  . Dyslipidemia  02/06/2017  . Coronary artery disease involving native coronary artery of native heart without angina pectoris 02/06/2017  . Essential hypertension 02/06/2017  . CAD (coronary artery disease) of artery bypass graft 02/25/2013  . Unstable angina ([place]) 02/01/2013   PCP:  Glenda Chroman, MD Pharmacy:   [id number] - [place], [place] [address] Phone: [phone] Fax: [phone]     Social Determinants of Health (SDOH) Interventions    Readmission Risk Interventions No flowsheet data found.

## 2020-02-12 NOTE — Progress Notes (Signed)
Occupational Therapy Treatment Patient Details Name: Tony [name] MRN: [id number] DOB: April 20, 1941 Today's Date: 02/12/2020    History of present illness 79 yo male s/p AAA repair on 6/15. PMH includes former smoker, CAD s/p CABG 01/2013, DMII, HTN, HLD, PVD, L ICA occlusion, gout, lumbar surgery 2010, cervical spine surgery 2011, R reverse shoulder arthroplasty 2019.   OT comments  Patient continues to make steady progress towards goals in skilled OT session. Patient's session encompassed education with regard to ADLs in peri-care and functional mobility in order to improve overall activity tolerance. Pt remains limited due to abdominal pain, and therefore is self-limiting in participating with therapy. While accepting of various education techniques, requires max encouragement to engage in ambulation and activities to increase activity tolerance. Discharge remains appropriate at this time, will continue to follow acutely.    Follow Up Recommendations  Home health OT;Supervision/Assistance - 24 hour    Equipment Recommendations  None recommended by OT    Recommendations for Other Services      Precautions / Restrictions Precautions Precautions: Fall Precaution Comments: midline abdominal incision - verbally reviewed log roll technique, pt up in chair upon PT arrival Restrictions Weight Bearing Restrictions: No       Mobility Bed Mobility               General bed mobility comments: up in chair  Transfers Overall transfer level: Needs assistance Equipment used: Rolling walker (2 wheeled) Transfers: Sit to/from Stand Sit to Stand: Min guard         General transfer comment: for safety, increased time to rise to upright due to abdominal discomfort.    Balance Overall balance assessment: Needs assistance Sitting-balance support: No upper extremity supported;Feet supported Sitting balance-Leahy Scale: Good     Standing balance support: No upper extremity  supported Standing balance-Leahy Scale: Fair Standing balance comment: able to stand statically without external support                           ADL either performed or assessed with clinical judgement   ADL Overall ADL's : Needs assistance/impaired Eating/Feeding: Set up                   Lower Body Dressing: Min guard;Cueing for safety;Cueing for sequencing;Sitting/lateral leans Lower Body Dressing Details (indicate cue type and reason): Verbally went over figure 4 and demonstrated ability to reach down to socks in sitting Toilet Transfer: Min guard;Cueing for safety;Cueing for sequencing;RW Toilet Transfer Details (indicate cue type and reason): Simulated in room   Toileting - Clothing Manipulation Details (indicate cue type and reason): Education provided with regard to toilet tongs and wipes     Functional mobility during ADLs: Min guard;Minimal assistance General ADL Comments: Self-limiting during session, however with max encouragement will complete     Vision       Perception     Praxis      Cognition Arousal/Alertness: Awake/alert Behavior During Therapy: WFL for tasks assessed/performed Overall Cognitive Status: Within Functional Limits for tasks assessed                                 General Comments: Pt talkative, can be tangential but appropriate        Exercises     Shoulder Instructions       General Comments      Pertinent Vitals/ Pain  Pain Assessment: Faces Pain Score: 5  Faces Pain Scale: Hurts little more Pain Location: abdomen Pain Descriptors / Indicators: Sore;Discomfort;Guarding;Operative site guarding Pain Intervention(s): Limited activity within patient's tolerance;Monitored during session;Repositioned  Home Living                                          Prior Functioning/Environment              Frequency  Min 2X/week        Progress Toward Goals  OT  Goals(current goals can now be found in the care plan section)  Progress towards OT goals: Progressing toward goals  Acute Rehab OT Goals Patient Stated Goal: return to independence and active lifestyle OT Goal Formulation: With patient Time For Goal Achievement: 02/24/20 Potential to Achieve Goals: Good  Plan Discharge plan remains appropriate    Co-evaluation                 AM-PAC OT "6 Clicks" Daily Activity     Outcome Measure   Help from another person eating meals?: None Help from another person taking care of personal grooming?: A Little Help from another person toileting, which includes using toliet, bedpan, or urinal?: A Lot Help from another person bathing (including washing, rinsing, drying)?: A Little Help from another person to put on and taking off regular upper body clothing?: A Little Help from another person to put on and taking off regular lower body clothing?: A Lot 6 Click Score: 17    End of Session Equipment Utilized During Treatment: Rolling walker  OT Visit Diagnosis: Other abnormalities of gait and mobility (R26.89);Pain Pain - Right/Left:  (Abdomen) Pain - part of body:  (Abdomen)   Activity Tolerance Patient tolerated treatment well   Patient Left in chair;with call bell/phone within reach;with family/visitor present   Nurse Communication Mobility status        Time: 6237-6283 OT Time Calculation (min): 17 min  Charges: OT General Charges $OT Visit: 1 Visit OT Treatments $Self Care/Home Management : 8-22 mins  Pollyann Glen E. Zulay Corrie, COTA/L Acute Rehabilitation Services [phone]   Cherlyn Cushing 02/12/2020, 2:56 PM

## 2020-02-13 LAB — GLUCOSE, CAPILLARY: Glucose-Capillary: 134 mg/dL — ABNORMAL HIGH (ref 70–99)

## 2020-02-13 MED ORDER — HYDROCODONE-ACETAMINOPHEN 5-325 MG PO TABS: 1.0000 | ORAL_TABLET | Freq: Four times a day (QID) | ORAL | 0 refills | Status: DC | PRN

## 2020-02-13 NOTE — Progress Notes (Addendum)
°  Progress Note    02/13/2020 7:41 AM 4 Days Post-Op  Subjective:  No complaints; tolerating a regular diet.   Vitals:   02/13/20 0559 02/13/20 0633  BP: (!) 168/77 (!) 166/73  Pulse:    Resp:    Temp:    SpO2:     Physical Exam: Lungs:  Non labored Incisions:  abd incision c/d/i Extremities:  Palpable DP pulses Abdomen:  Soft, NT, ND Neurologic: A&O  CBC    Component Value Date/Time   WBC 13.7 (H) 02/11/2020 0603   RBC 3.64 (L) 02/11/2020 0603   HGB 10.7 (L) 02/11/2020 0603   HCT 33.6 (L) 02/11/2020 0603   PLT 102 (L) 02/11/2020 0603   MCV 92.3 02/11/2020 0603   MCH 29.4 02/11/2020 0603   MCHC 31.8 02/11/2020 0603   RDW 13.6 02/11/2020 0603    BMET    Component Value Date/Time   NA 140 02/10/2020 0347   K 4.4 02/10/2020 0347   CL 108 02/10/2020 0347   CO2 21 (L) 02/10/2020 0347   GLUCOSE 173 (H) 02/10/2020 0347   BUN 16 02/10/2020 0347   CREATININE 1.17 02/10/2020 0347   CALCIUM 8.2 (L) 02/10/2020 0347   GFRNONAA 60 (L) 02/10/2020 0347   GFRAA >60 02/10/2020 0347    INR    Component Value Date/Time   INR 1.1 02/09/2020 1248     Intake/Output Summary (Last 24 hours) at 02/13/2020 0741 Last data filed at 02/13/2020 0600 Gross per 24 hour  Intake 720 ml  Output 2400 ml  Net -1680 ml     Assessment/Plan:  79 y.o. male is s/p open AAA repair with aortobiiliac graft 4 Days Post-Op   BLE well perfused with palpable DP pulses Tolerating regular diet Ready for discharge home today Pt refused HH PT Follow up with Dr. Edilia Bo in 2-3 weeks    Emilie Rutter, PA-C [facility] [phone] 02/13/2020 7:41 AM  I have independently interviewed and examined patient and agree with PA assessment and plan above.   Zilda No C. Randie Heinz, MD [facility] [place] Office: [phone] Pager: [phone]

## 2020-02-13 NOTE — Discharge Instructions (Signed)
 [facility] [place]  Discharge Instructions   Open Aortic Surgery  Please refer to the following instructions for your post-procedure care. Your surgeon or Physician Assistant will discuss any changes with you.  Activity  Avoid lifting more than eight pounds (a gallon of milk) until after your first post-operative visit. You are encouraged to walk as much as you can. You can slowly return to normal activities but must avoid strenuous activity and heavy lifting until your doctor tells you it's OK. Heavy lifting can hurt the incision and cause a hernia. Avoid activities such as vacuuming or swinging a golf club. It is normal to feel tired for several weeks after your surgery. Do not drive until your doctor gives the OK and you are no longer taking prescription pain medications. It is also normal to have difficulty with sleep habits, eating and bowl movements after surgery. These will go away with time.  Bathing/Showering  You may shower after you go home. Do not soak in a bathtub, hot tub, or swim until the incision heals.  Incision Care  Shower every day. Clean your incision with mild soap and water. Pat the area dry with a clean towel. You do not need a bandage unless otherwise instructed. Do not apply any ointments or creams to your incision. You may have skin glue on your incision. Do not peel it off. It will come off on its own in about one week. If you have staples or sutures along your incision, they will be removed at your post op appointment.  Diet  Resume your normal diet. There are no special food restriction following this procedure. A low fat/low cholesterol diet is recommended for all patients with vascular disease. After your aortic surgery, it's normal to feel full faster than usual and to not feel as hungry as you normally would. You will probably lose weight initially following your surgery. It's best to eat small, frequent meals over the course of  the day. Call the office if you find that you are unable to eat even small meals. In order to heal from your surgery, it is CRITICAL to get adequate nutrition. Your body requires vitamins, minerals, and protein. Vegetables are the best source of vitamins and minerals. causing pain, you may take over-the-counter pain reliever such as acetaminophen (Tylenol). If you were prescribed a stronger pain medication, please be aware these medication can cause nausea and constipation. Prevent nausea by taking the medication with a snack or meal. Avoid constipation by drinking plenty of fluids and eating foods with a high amount of fiber, such as fruits, vegetables and grains. Take 100mg of the over-the-counter stool softener Colace twice a day as needed to help with constipation. A laxative, such as Milk of Magnesia, may be recommended for you at this time. Do not take a laxative unless your surgeon or P.A. tells you it's OK. Do not take Tylenol if you are taking stronger pain medications (such as Percocet).  Follow Up  Our office will schedule a follow up appointment 2-3 weeks after discharge.  Please call us immediately for any of the following conditions    .     Severe or worsening pain in your legs or feet or in your abdomen back or chest. Increased pain, redness drainage (pus) from your incision site. Increased abdominal pain, bloating, nausea, vomiting, or persistent diarrhea. Fever of 101 degrees or higher. Swelling in your leg (s).  Reduce your risk of vascular disease  Stop smoking.   If you would like help, call [facility] at 1-800-QUIT-NOW (1-800-784-8669) or [place] at 336-586-4000. Manage your cholesterol Maintain a desired weight Control your diabetes Keep your blood pressure down  If you have any questions please call the office at [phone].   

## 2020-02-13 NOTE — Progress Notes (Signed)
02/13/2020 11:45 AM Discharge AVS meds taken today and those due this evening reviewed.  Follow-up appointments and when to call md reviewed.  D/C IV and TELE.  Questions and concerns addressed.   D/C home per orders. [name]

## 2020-02-14 NOTE — Discharge Summary (Signed)
Aorta Discharge Summary    Tony [name] March 01, 1941 79 y.o. male  [id number]  Admission Date: 02/09/2020  Discharge Date: 02/13/20  Physician: Dr. Scot Dock  Admission Diagnosis: AAA (abdominal aortic aneurysm) [facility]) [I71.4]  Discharge Day services:    see progress note 02/13/20    Hospital Course:  The patient was admitted to the hospital and taken to the operating room on 02/09/2020 and underwent: Open AAA repair using aortobiiliac graft.  The pt tolerated the procedure well and was transported to the PACU in fair condition.  He remained in the PACU for 24 to 36 hours postoperatively.  The remainder of hospital course consisted of increasing mobility, advancing diet, and postoperative pain control.  He maintained palpable and symmetric DP pulses throughout his hospital stay.  At the time of discharge he refused home health physical therapy services.  Of note, he has a aspirin and statin allergy.  He was prescribed 3 to 4 days of narcotic pain medication for continued postoperative pain control.  He will follow-up in office with Dr. Scot Dock in 2 to 3 weeks.  He was discharged in stable condition.  CBC    Component Value Date/Time   WBC 13.7 (H) 02/11/2020 0603   RBC 3.64 (L) 02/11/2020 0603   HGB 10.7 (L) 02/11/2020 0603   HCT 33.6 (L) 02/11/2020 0603   PLT 102 (L) 02/11/2020 0603   MCV 92.3 02/11/2020 0603   MCH 29.4 02/11/2020 0603   MCHC 31.8 02/11/2020 0603   RDW 13.6 02/11/2020 0603    BMET    Component Value Date/Time   NA 140 02/10/2020 0347   K 4.4 02/10/2020 0347   CL 108 02/10/2020 0347   CO2 21 (L) 02/10/2020 0347   GLUCOSE 173 (H) 02/10/2020 0347   BUN 16 02/10/2020 0347   CREATININE 1.17 02/10/2020 0347   CALCIUM 8.2 (L) 02/10/2020 0347   GFRNONAA 60 (L) 02/10/2020 0347   GFRAA >60 02/10/2020 0347       Discharge Diagnosis:  AAA (abdominal aortic aneurysm) ([place]) [I71.4]  Secondary Diagnosis: Patient Active Problem List   Diagnosis Date  Noted  . AAA (abdominal aortic aneurysm) ([place]) 02/09/2020  . Carotid stenosis, asymptomatic, bilateral 02/01/2020  . AAA (abdominal aortic aneurysm) without rupture ([place]) 01/31/2020  . Preop cardiovascular exam 01/31/2020  . Educated about COVID-19 virus infection 08/30/2019  . Rotator cuff tear arthropathy, right 06/25/2018  . Bilateral carotid artery disease ([place]) 02/06/2017  . Type 2 diabetes mellitus with complication, without long-term current use of insulin ([place]) 02/06/2017  . Dyslipidemia 02/06/2017  . Coronary artery disease involving native coronary artery of native heart without angina pectoris 02/06/2017  . Essential hypertension 02/06/2017  . CAD (coronary artery disease) of artery bypass graft 02/25/2013  . Unstable angina ([place]) 02/01/2013   Past Medical History:  Diagnosis Date  . AAA (abdominal aortic aneurysm) ([place])   . Arthritis   . Carotid artery disease ([place])    left ICA occlusion  . Coronary artery disease   . Diabetes mellitus Age 77   Type 2  . Gout   . Heart murmur    as a child  . Hyperlipidemia   . Hypertension   . Joint pain   . Leg pain   . PVD (peripheral vascular disease) ([facility])    SFA 50%, stenosis right profunda     Allergies as of 02/13/2020      Reactions   Aspirin Other (See Comments)   Causes gout flare   Statins Other (See  Comments)   Pt states severe muscle pains.      Medication List    TAKE these medications   benazepril 10 MG tablet Commonly known as: LOTENSIN Take 10 mg by mouth 2 (two) times daily.   glipiZIDE 5 MG tablet Commonly known as: GLUCOTROL Take 5 mg by mouth 2 (two) times daily.   HYDROcodone-acetaminophen 5-325 MG tablet Commonly known as: Norco Take 1 tablet by mouth every 6 (six) hours as needed for moderate pain.   Praluent 150 MG/ML Soaj Generic drug: Alirocumab Inject 150 mg into the skin every 14 (fourteen) days.   Voltaren 1 % Gel Generic drug: diclofenac Sodium Apply 2 g topically daily.         Instructions:  [facility] Discharge Instructions Open Aortic Surgery  Please refer to the following instructions for your post-procedure care. Your surgeon or Physician Assistant will discuss any changes with you.  Activity  Avoid lifting more than eight pounds (a gallon of milk) until after your first post-operative visit. You are encouraged to walk as much as you can. You can slowly return to normal activities but must avoid strenuous activity and heavy lifting until your doctor tells you it's OK. Heavy lifting can hurt the incision and cause a hernia. Avoid activities such as vacuuming or swinging a golf club. It is normal to feel tired for several weeks after your surgery. Do not drive until your doctor gives the OK and you are no longer taking prescription pain medications. It is also normal to have difficulty with sleep habits, eating and bowl movements after surgery. These will go away with time.  Bathing/Showering  You may shower after you go home. Do not soak in a bathtub, hot tub, or swim until the incision heals.  Incision Care  Shower every day. Clean your incision with mild soap and water. Pat the area dry with a clean towel. You do not need a bandage unless otherwise instructed. Do not apply any ointments or creams to your incision. You may have skin glue on your incision. Do not peel it off. It will come off on its own in about one week. If you have staples or sutures along your incision, they will be removed at your post op appointment.  If you have groin incisions, wash the groin wounds with soap and water daily and pat dry. (No tub bath-only shower)  Then put a dry gauze or washcloth in the groin to keep this area dry to help prevent wound infection.  Do this daily and as needed.  Do not use Vaseline or neosporin on your incisions.  Only use soap and water on your incisions and then protect and keep dry.  Diet  Resume your normal diet.  There are no special food restriction following this procedure. A low fat/low cholesterol diet is recommended for all patients with vascular disease. After your aortic surgery, it's normal to feel full faster than usual and to not feel as hungry as you normally would. You will probably lose weight initially following your surgery. It's best to eat small, frequent meals over the course of the day. Call the office if you find that you are unable to eat even small meals. In order to heal from your surgery, it is CRITICAL to get adequate nutrition. Your body requires vitamins, minerals, and protein. Vegetables are the best source of vitamins and minerals. causing pain, you may take over-the-counter pain reliever such as acetaminophen (Tylenol). If you were  prescribed a stronger pain medication, please be aware these medication can cause nausea and constipation. Prevent nausea by taking the medication with a snack or meal. Avoid constipation by drinking plenty of fluids and eating foods with a high amount of fiber, such as fruits, vegetables and grains. Take 100mg  of the over-the-counter stool softener Colace twice a day as needed to help with constipation. A laxative, such as Milk of Magnesia, may be recommended for you at this time. Do not take a laxative unless your surgeon or Physician Assistant. tells you it's OK. Do not take Tylenol if you are taking stronger pain medications (such as Percocet).  Follow Up  Our office will schedule a follow up appointment 2-3 weeks after discharge.  Please call immediately for any of the following conditions    .     Severe or worsening pain in your legs or feet or in your abdomen back or chest. . Increased pain, redness drainage (pus) from your incision site. . Increased abdominal pain, bloating, nausea, vomiting, or persistent diarrhea. . Fever of 101 degrees or higher. . Swelling in your leg (s). .  Reduce your risk of vascular disease  . Stop smoking. If you  would like help, call [facility] at 1-800-QUIT-NOW ((725) 107-9242) or [place] at 6602025572. . Manage your cholesterol . Maintain a desired weight . Control your diabetes . Keep your blood pressure down .  If you have any questions please call the office at [phone].   Disposition: home  Patient's condition: is Good  Follow up: 1. Dr. [phone] in 2 weeks   Edilia Bo, PA-C [facility] [phone] 02/14/2020  8:12 AM   - For VQI Registry use -  Post-op:  Time to Extubation: [x]  In OR, [ ]  < 12 hrs, [ ]  12-24 hrs, [ ]  >=24 hrs Vasopressors Req. Post-op: No ICU Stay: 1 day in ICU Transfusion: No   MI: No, [ ]  Troponin only, [ ]  EKG or Clinical New Arrhythmia: No  Complications: CHF: No Resp failure: No, [ ]  Pneumonia, [ ]  Ventilator Chg in renal function: No, [ ]  Inc. Cr > 0.5, [ ]  Temp. Dialysis, [ ]  Permanent dialysis Leg ischemia: No, no Surgery needed, [ ]  Yes, Surgery needed, [ ]  Amputation Bowel ischemia: No, [ ]  Medical Rx, [ ]  Surgical Rx Wound complication: No, [ ]  Superficial separation/infection, [ ]  Return to OR Return to OR: No  Return to OR for bleeding: No Stroke: No, [ ]  Minor, [ ]  Major  Discharge medications: Statin use:  No If No:  allergy ASA use:  No  If No:  allergy Plavix use:  No  Beta blocker use:  No  ACEI use:  Yes ARB use:  No CCB use:  No Coumadin use:  No

## 2020-02-17 DIAGNOSIS — Z299 Encounter for prophylactic measures, unspecified: Secondary | ICD-10-CM | POA: Diagnosis not present

## 2020-02-17 DIAGNOSIS — I10 Essential (primary) hypertension: Secondary | ICD-10-CM | POA: Diagnosis not present

## 2020-02-17 DIAGNOSIS — E1165 Type 2 diabetes mellitus with hyperglycemia: Secondary | ICD-10-CM | POA: Diagnosis not present

## 2020-02-17 DIAGNOSIS — Z09 Encounter for follow-up examination after completed treatment for conditions other than malignant neoplasm: Secondary | ICD-10-CM | POA: Diagnosis not present

## 2020-02-17 DIAGNOSIS — I714 Abdominal aortic aneurysm, without rupture: Secondary | ICD-10-CM | POA: Diagnosis not present

## 2020-02-17 DIAGNOSIS — Z6831 Body mass index (BMI) 31.0-31.9, adult: Secondary | ICD-10-CM | POA: Diagnosis not present

## 2020-02-24 ENCOUNTER — Encounter (HOSPITAL_COMMUNITY): Payer: Self-pay

## 2020-02-24 ENCOUNTER — Ambulatory Visit (HOSPITAL_COMMUNITY): Payer: Medicare HMO | Attending: Cardiovascular Disease

## 2020-02-24 NOTE — Progress Notes (Signed)
Verified appointment "no show" status with Tony [name] at 10:29.

## 2020-03-02 ENCOUNTER — Other Ambulatory Visit: Payer: Self-pay

## 2020-03-02 ENCOUNTER — Ambulatory Visit (INDEPENDENT_AMBULATORY_CARE_PROVIDER_SITE_OTHER): Payer: Self-pay | Admitting: Vascular Surgery

## 2020-03-02 ENCOUNTER — Encounter: Payer: Self-pay | Admitting: Vascular Surgery

## 2020-03-02 VITALS — BP 135/86 | HR 87 | Temp 98.4°F | Resp 20 | Ht 71.0 in | Wt 212.0 lb

## 2020-03-02 DIAGNOSIS — I714 Abdominal aortic aneurysm, without rupture, unspecified: Secondary | ICD-10-CM

## 2020-03-02 DIAGNOSIS — Z48812 Encounter for surgical aftercare following surgery on the circulatory system: Secondary | ICD-10-CM

## 2020-03-02 NOTE — Progress Notes (Signed)
   Patient name: Tony [name] MRN: [id number] DOB: 1941-07-03 Sex: male  REASON FOR VISIT:   Follow-up after open repair of abdominal aortic aneurysm.  HPI:   Tony [name] is a pleasant 79 y.o. male who presented with an enlarging abdominal aortic aneurysm.  This was 5.6 cm in maximum diameter.  Elective repair was recommended and he was not a candidate for an endovascular approach given that he had significant thrombus in the neck.  On 02/09/2020 he underwent aortic endarterectomy and repair of his abdominal aortic aneurysm with an aortobiiliac graft.  He did well postoperatively and was discharged on postoperative day 4.  Today he has no specific complaints except for some generalized fatigue.  He has been gradually resuming his normal activities.  His bowels have been functioning.  He has no claudication or rest pain.  Current Outpatient Medications  Medication Sig Dispense Refill  . Alirocumab (PRALUENT) 150 MG/ML SOAJ Inject 150 mg into the skin every 14 (fourteen) days. 2 pen 11  . benazepril (LOTENSIN) 10 MG tablet Take 10 mg by mouth 2 (two) times daily.     . diclofenac Sodium (VOLTAREN) 1 % GEL Apply 2 g topically daily.    Marland Kitchen glipiZIDE (GLUCOTROL) 5 MG tablet Take 5 mg by mouth 2 (two) times daily.      No current facility-administered medications for this visit.    REVIEW OF SYSTEMS:  [X]  denotes positive finding, [ ]  denotes negative finding Vascular    Leg swelling    Cardiac    Chest pain or chest pressure:    Shortness of breath upon exertion:    Short of breath when lying flat:    Irregular heart rhythm:    Constitutional    Fever or chills:     PHYSICAL EXAM:   Vitals:   03/02/20 1456  BP: 135/86  Pulse: 87  Resp: 20  Temp: 98.4 F (36.9 C)  SpO2: 98%  Weight: 212 lb (96.2 kg)  Height: 5\' 11"  (1.803 m)    GENERAL: The patient is a well-nourished male, in no acute distress. The vital signs are documented above. CARDIOVASCULAR: There is a regular rate  and rhythm. PULMONARY: There is good air exchange bilaterally without wheezing or rales. ABDOMEN: His abdomen is soft and nontender.  He has normal pitched bowel sounds.  His incision is healing nicely. VASCULAR: He has palpable femoral pulses.  Both feet are warm and well perfused.  DATA:   No new data  MEDICAL ISSUES:   S/P OPEN ABDOMINAL AORTIC ANEURYSM REPAIR: The patient is doing well status post open repair of his abdominal aortic aneurysm.  He has been gradually resuming his normal activities.  He has no specific complaints.  I have ordered follow-up ABIs in 3 months and I will see him back at that time.  He knows to call sooner if he has problems.  VASCULAR QUALITY INITIATIVE: He does not tolerate aspirin as he gets a gout flareup whenever he takes aspirin.  In addition he does not tolerate statins but does get Praluent injection every 2 weeks.   [facility] [place] [phone]

## 2020-03-03 ENCOUNTER — Other Ambulatory Visit: Payer: Self-pay | Admitting: *Deleted

## 2020-03-03 DIAGNOSIS — Z0100 Encounter for examination of eyes and vision without abnormal findings: Secondary | ICD-10-CM | POA: Diagnosis not present

## 2020-03-03 DIAGNOSIS — I714 Abdominal aortic aneurysm, without rupture, unspecified: Secondary | ICD-10-CM

## 2020-03-22 ENCOUNTER — Ambulatory Visit (HOSPITAL_COMMUNITY): Payer: Medicare HMO | Attending: Cardiology

## 2020-03-22 ENCOUNTER — Other Ambulatory Visit: Payer: Self-pay

## 2020-03-22 DIAGNOSIS — I350 Nonrheumatic aortic (valve) stenosis: Secondary | ICD-10-CM | POA: Insufficient documentation

## 2020-03-22 LAB — ECHOCARDIOGRAM COMPLETE
AR max vel: 1.8 cm2
AV Area VTI: 2.01 cm2
AV Area mean vel: 1.96 cm2
AV Mean grad: 11 mmHg
AV Peak grad: 23.2 mmHg
Ao pk vel: 2.41 m/s
Area-P 1/2: 2.84 cm2
S' Lateral: 3 cm

## 2020-04-04 DIAGNOSIS — E1165 Type 2 diabetes mellitus with hyperglycemia: Secondary | ICD-10-CM | POA: Diagnosis not present

## 2020-04-04 DIAGNOSIS — I10 Essential (primary) hypertension: Secondary | ICD-10-CM | POA: Diagnosis not present

## 2020-04-04 DIAGNOSIS — I714 Abdominal aortic aneurysm, without rupture: Secondary | ICD-10-CM | POA: Diagnosis not present

## 2020-04-04 DIAGNOSIS — N183 Chronic kidney disease, stage 3 unspecified: Secondary | ICD-10-CM | POA: Diagnosis not present

## 2020-04-04 DIAGNOSIS — Z6830 Body mass index (BMI) 30.0-30.9, adult: Secondary | ICD-10-CM | POA: Diagnosis not present

## 2020-04-04 DIAGNOSIS — Z299 Encounter for prophylactic measures, unspecified: Secondary | ICD-10-CM | POA: Diagnosis not present

## 2020-04-26 DIAGNOSIS — I10 Essential (primary) hypertension: Secondary | ICD-10-CM | POA: Diagnosis not present

## 2020-04-26 DIAGNOSIS — I714 Abdominal aortic aneurysm, without rupture: Secondary | ICD-10-CM | POA: Diagnosis not present

## 2020-04-26 DIAGNOSIS — E1122 Type 2 diabetes mellitus with diabetic chronic kidney disease: Secondary | ICD-10-CM | POA: Diagnosis not present

## 2020-04-26 DIAGNOSIS — E1165 Type 2 diabetes mellitus with hyperglycemia: Secondary | ICD-10-CM | POA: Diagnosis not present

## 2020-04-26 DIAGNOSIS — Z299 Encounter for prophylactic measures, unspecified: Secondary | ICD-10-CM | POA: Diagnosis not present

## 2020-04-26 DIAGNOSIS — N183 Chronic kidney disease, stage 3 unspecified: Secondary | ICD-10-CM | POA: Diagnosis not present

## 2020-05-05 ENCOUNTER — Other Ambulatory Visit: Payer: Self-pay

## 2020-05-05 ENCOUNTER — Ambulatory Visit (INDEPENDENT_AMBULATORY_CARE_PROVIDER_SITE_OTHER): Payer: Medicare HMO

## 2020-05-05 DIAGNOSIS — I6523 Occlusion and stenosis of bilateral carotid arteries: Secondary | ICD-10-CM | POA: Diagnosis not present

## 2020-05-06 DIAGNOSIS — M542 Cervicalgia: Secondary | ICD-10-CM | POA: Diagnosis not present

## 2020-05-06 DIAGNOSIS — E1122 Type 2 diabetes mellitus with diabetic chronic kidney disease: Secondary | ICD-10-CM | POA: Diagnosis not present

## 2020-05-06 DIAGNOSIS — N183 Chronic kidney disease, stage 3 unspecified: Secondary | ICD-10-CM | POA: Diagnosis not present

## 2020-05-06 DIAGNOSIS — Z299 Encounter for prophylactic measures, unspecified: Secondary | ICD-10-CM | POA: Diagnosis not present

## 2020-05-06 DIAGNOSIS — I10 Essential (primary) hypertension: Secondary | ICD-10-CM | POA: Diagnosis not present

## 2020-05-06 DIAGNOSIS — E1165 Type 2 diabetes mellitus with hyperglycemia: Secondary | ICD-10-CM | POA: Diagnosis not present

## 2020-05-11 DIAGNOSIS — R69 Illness, unspecified: Secondary | ICD-10-CM | POA: Diagnosis not present

## 2020-06-01 ENCOUNTER — Encounter: Payer: Self-pay | Admitting: Vascular Surgery

## 2020-06-01 ENCOUNTER — Ambulatory Visit (HOSPITAL_COMMUNITY)
Admission: RE | Admit: 2020-06-01 | Discharge: 2020-06-01 | Disposition: A | Discharge status: Home / Self Care | Payer: Medicare HMO | Source: Ambulatory Visit | Attending: Vascular Surgery | Admitting: Vascular Surgery

## 2020-06-01 ENCOUNTER — Ambulatory Visit (INDEPENDENT_AMBULATORY_CARE_PROVIDER_SITE_OTHER): Payer: Medicare HMO | Admitting: Vascular Surgery

## 2020-06-01 ENCOUNTER — Other Ambulatory Visit: Payer: Self-pay

## 2020-06-01 VITALS — BP 166/92 | HR 77 | Temp 98.5°F | Resp 20 | Ht 71.0 in | Wt 215.0 lb

## 2020-06-01 DIAGNOSIS — I714 Abdominal aortic aneurysm, without rupture, unspecified: Secondary | ICD-10-CM

## 2020-06-01 DIAGNOSIS — Z48812 Encounter for surgical aftercare following surgery on the circulatory system: Secondary | ICD-10-CM | POA: Diagnosis not present

## 2020-06-01 NOTE — Progress Notes (Signed)
REASON FOR VISIT:   Follow-up after open repair of abdominal aortic aneurysm  MEDICAL ISSUES:   ABDOMINAL AORTIC ANEURYSM: Patient is doing well status post open repair of his abdominal aortic aneurysm with an aortobiiliac graft.  He has palpable femoral pulses.  He has known infrainguinal arterial occlusive disease bilaterally.  I think he is been less active before and immediately after his surgery which likely explains his worsening symptoms.  I simply encouraged him to try to get more active and start to walk again.  Ideally want him on a structured walking program.  Fortunately he is not a smoker.  We also discussed the importance of nutrition.  I have ordered follow-up ABIs in 9 months and I will see him back at that time.  He knows to call sooner if he has problems.  He has had some scrotal surgery since surgery and I cannot explain this based on the fact that we did not make incisions in the groins.  If this worsens he will make arrangements to see a neurologist.  PERIPHERAL VASCULAR DISEASE: The patient has evidence of infrainguinal arterial occlusive disease bilaterally.  He has stable claudication.  He has no rest pain.  I simply encouraged him to stay as active as possible.  I ordered follow-up ABIs in 9 months and I will see him back.  VASCULAR QUALITY INITIATIVE: He does not tolerate aspirin as he gets a gout flareup whenever he takes aspirin.  In addition he does not tolerate statins but does get Praluent injection every 2 weeks.   HPI:   Tony [name] is a pleasant 79 y.o. male who presented with an enlarging abdominal aortic aneurysm.  Aneurysm was 5.6 cm in maximum diameter.  He was not a candidate for endovascular repair and on 02/09/2020 he underwent aortic endarterectomy and repair of his abdominal aortic aneurysm with an aortobiiliac graft.  Past Medical History:  Diagnosis Date  . AAA (abdominal aortic aneurysm) ([facility])   . Arthritis   . Carotid artery disease ([facility])     left ICA occlusion  . Coronary artery disease   . Diabetes mellitus Age 35   Type 2  . Gout   . Heart murmur    as a child  . Hyperlipidemia   . Hypertension   . Joint pain   . Leg pain   . PVD (peripheral vascular disease) ([facility])    SFA 50%, stenosis right profunda    Family History  Problem Relation Age of Onset  . Heart failure Mother 7  . Cancer Sister     SOCIAL HISTORY: Social History   Tobacco Use  . Smoking status: Former Smoker    Packs/day: 3.00    Years: 40.00    Pack years: 120.00    Types: Cigarettes    Quit date: 05/27/1992    Years since quitting: 28.0  . Smokeless tobacco: Never Used  Substance Use Topics  . Alcohol use: Not Currently    Comment: QUIT 1993    Allergies  Allergen Reactions  . Aspirin Other (See Comments)    Causes gout flare  . Statins Other (See Comments)    Pt states severe muscle pains.    Current Outpatient Medications  Medication Sig Dispense Refill  . Alirocumab (PRALUENT) 150 MG/ML SOAJ Inject 150 mg into the skin every 14 (fourteen) days. 2 pen 11  . benazepril (LOTENSIN) 20 MG tablet Take by mouth.    . diclofenac Sodium (VOLTAREN) 1 % GEL Apply 2  g topically daily.    Marland Kitchen glipiZIDE (GLUCOTROL) 5 MG tablet Take 5 mg by mouth 2 (two) times daily.      No current facility-administered medications for this visit.   REVIEW OF SYSTEMS:  [X]  denotes positive finding, [ ]  denotes negative finding Cardiac  Comments:  Chest pain or chest pressure:    Shortness of breath upon exertion:    Short of breath when lying flat:    Irregular heart rhythm:        Vascular    Pain in calf, thigh, or hip brought on by ambulation:    Pain in feet at night that wakes you up from your sleep:     Blood clot in your veins:    Leg swelling:         Pulmonary    Oxygen at home:    Productive cough:     Wheezing:         Neurologic    Sudden weakness in arms or legs:     Sudden numbness in arms or legs:     Sudden onset of  difficulty speaking or slurred speech:    Temporary loss of vision in one eye:     Problems with dizziness:         Gastrointestinal    Blood in stool:     Vomited blood:         Genitourinary    Burning when urinating:     Blood in urine:        Psychiatric    Major depression:         Hematologic    Bleeding problems:    Problems with blood clotting too easily:        Skin    Rashes or ulcers:        Constitutional    Fever or chills:     PHYSICAL EXAM:   Vitals:   06/01/20 1236  BP: (!) 166/92  Pulse: 77  Resp: 20  Temp: 98.5 F (36.9 C)  SpO2: 96%  Weight: 215 lb (97.5 kg)  Height: 5\' 11"  (1.803 m)    GENERAL: The patient is a well-nourished male, in no acute distress. The vital signs are documented above. CARDIAC: There is a regular rate and rhythm.  VASCULAR: I do not detect carotid bruits. He has palpable femoral pulses.  I cannot palpate pedal pulses. Both feet are warm and well perfused. He has no significant lower extremity swelling. PULMONARY: There is good air exchange bilaterally without wheezing or rales. ABDOMEN: Soft and non-tender with normal pitched bowel sounds.  His incision is healing nicely. MUSCULOSKELETAL: There are no major deformities or cyanosis. NEUROLOGIC: No focal weakness or paresthesias are detected. SKIN: There are no ulcers or rashes noted. PSYCHIATRIC: The patient has a normal affect.  DATA:    ARTERIAL DOPPLER STUDY: I have independently interpreted his arterial Doppler study today.  On the right side there is a monophasic posterior tibial signal with a biphasic dorsalis pedis signal.  ABI 69%.  Toe pressures 86 mmHg.  On the left side there is a monophasic dorsalis pedis signal.  Posterior tibial signal cannot be obtained ABI is 54%  [facility] 08/01/20 [phone]

## 2020-06-02 ENCOUNTER — Other Ambulatory Visit: Payer: Self-pay | Admitting: *Deleted

## 2020-06-02 DIAGNOSIS — M79606 Pain in leg, unspecified: Secondary | ICD-10-CM

## 2020-06-20 DIAGNOSIS — I10 Essential (primary) hypertension: Secondary | ICD-10-CM | POA: Diagnosis not present

## 2020-06-20 DIAGNOSIS — Z2821 Immunization not carried out because of patient refusal: Secondary | ICD-10-CM | POA: Diagnosis not present

## 2020-06-20 DIAGNOSIS — N433 Hydrocele, unspecified: Secondary | ICD-10-CM | POA: Diagnosis not present

## 2020-06-20 DIAGNOSIS — I739 Peripheral vascular disease, unspecified: Secondary | ICD-10-CM | POA: Diagnosis not present

## 2020-06-20 DIAGNOSIS — Z299 Encounter for prophylactic measures, unspecified: Secondary | ICD-10-CM | POA: Diagnosis not present

## 2020-06-27 DIAGNOSIS — I739 Peripheral vascular disease, unspecified: Secondary | ICD-10-CM | POA: Diagnosis not present

## 2020-06-27 DIAGNOSIS — I70211 Atherosclerosis of native arteries of extremities with intermittent claudication, right leg: Secondary | ICD-10-CM | POA: Diagnosis not present

## 2020-08-03 DIAGNOSIS — E1122 Type 2 diabetes mellitus with diabetic chronic kidney disease: Secondary | ICD-10-CM | POA: Diagnosis not present

## 2020-08-03 DIAGNOSIS — I10 Essential (primary) hypertension: Secondary | ICD-10-CM | POA: Diagnosis not present

## 2020-08-03 DIAGNOSIS — E1165 Type 2 diabetes mellitus with hyperglycemia: Secondary | ICD-10-CM | POA: Diagnosis not present

## 2020-08-03 DIAGNOSIS — N183 Chronic kidney disease, stage 3 unspecified: Secondary | ICD-10-CM | POA: Diagnosis not present

## 2020-08-03 DIAGNOSIS — Z299 Encounter for prophylactic measures, unspecified: Secondary | ICD-10-CM | POA: Diagnosis not present

## 2020-08-03 DIAGNOSIS — M542 Cervicalgia: Secondary | ICD-10-CM | POA: Diagnosis not present

## 2020-08-03 DIAGNOSIS — Z2821 Immunization not carried out because of patient refusal: Secondary | ICD-10-CM | POA: Diagnosis not present

## 2020-08-18 DIAGNOSIS — N430 Encysted hydrocele: Secondary | ICD-10-CM | POA: Diagnosis not present

## 2020-08-22 ENCOUNTER — Other Ambulatory Visit: Payer: Self-pay | Admitting: Urology

## 2020-08-22 ENCOUNTER — Telehealth: Payer: Self-pay | Admitting: Cardiology

## 2020-08-22 NOTE — Telephone Encounter (Signed)
        [facility] Pre-operative Risk Assessment    HEARTCARE STAFF: - Please ensure there is not already an duplicate clearance open for this procedure. - Under Visit Info/Reason for Call, type in Other and utilize the format Clearance MM/DD/YY or Clearance TBD. Do not use dashes or single digits. - If request is for dental extraction, please clarify the # of teeth to be extracted.  Request for surgical clearance:  1. What type of surgery is being performed? bilateral hydrocelectomy   2. When is this surgery scheduled? 09/09/20  3. What type of clearance is required (medical clearance vs. Pharmacy clearance to hold med vs. Both)? Medical  4. Are there any medications that need to be held prior to surgery and how long?  5. Practice name and name of physician performing surgery? Dr. Jeffie Pollock  6. What is the office phone number? [phone] ext. [phone]   7.   What is the office fax number? [phone]  8.   Anesthesia type (None, local, MAC, general) ? General   Tony [name] 08/22/2020, 2:18 PM  _________________________________________________________________   (provider comments below)

## 2020-08-22 NOTE — Telephone Encounter (Signed)
   Primary Cardiologist: [name], MD  Chart reviewed as part of pre-operative protocol coverage. Patient was contacted 08/22/2020 in reference to pre-operative risk assessment for pending surgery as outlined below.  Nyoka Lint was last seen on 02/02/20 by Dr. Antoine Poche.  Since that day, VICKIE PONDS has done well. Reports no chest pain, pressure, tightness, shortness of breath, nor dyspnea on exertion.   Therefore, based on ACC/AHA guidelines, the patient would be at acceptable risk for the planned procedure without further cardiovascular testing.   The patient was advised that if he develops new symptoms prior to surgery to contact our office to arrange for a follow-up visit, and he verbalized understanding.  I will route this recommendation to the requesting party via Epic fax function and remove from pre-op pool. Please call with questions.  Alver Sorrow, NP 08/22/2020, 3:00 PM

## 2020-08-31 ENCOUNTER — Encounter (HOSPITAL_COMMUNITY): Payer: Medicare HMO

## 2020-08-31 ENCOUNTER — Ambulatory Visit: Payer: Medicare HMO | Admitting: Vascular Surgery

## 2020-09-06 ENCOUNTER — Other Ambulatory Visit (HOSPITAL_COMMUNITY)
Admission: RE | Admit: 2020-09-06 | Discharge: 2020-09-06 | Disposition: A | Discharge status: Home / Self Care | Payer: Medicare HMO | Source: Ambulatory Visit | Attending: Urology | Admitting: Urology

## 2020-09-06 DIAGNOSIS — Z01812 Encounter for preprocedural laboratory examination: Secondary | ICD-10-CM | POA: Insufficient documentation

## 2020-09-06 DIAGNOSIS — Z20822 Contact with and (suspected) exposure to covid-19: Secondary | ICD-10-CM | POA: Insufficient documentation

## 2020-09-06 LAB — SARS CORONAVIRUS 2 (TAT 6-24 HRS): SARS Coronavirus 2: NEGATIVE

## 2020-09-07 ENCOUNTER — Encounter (HOSPITAL_COMMUNITY): Payer: Medicare HMO

## 2020-09-07 ENCOUNTER — Other Ambulatory Visit: Payer: Self-pay

## 2020-09-07 ENCOUNTER — Ambulatory Visit: Payer: Medicare HMO | Admitting: Vascular Surgery

## 2020-09-07 ENCOUNTER — Encounter (HOSPITAL_BASED_OUTPATIENT_CLINIC_OR_DEPARTMENT_OTHER): Payer: Self-pay | Admitting: Urology

## 2020-09-07 NOTE — Progress Notes (Addendum)
Spoke w/ via phone for pre-op interview--- PT Lab needs dos----  Istat             Lab results------ current ekg in epic/ chart COVID test ------ done 09-06-2020 negative result in epic Arrive at ------- 1045 on 09-09-2020 NPO after MN NO Solid Food.  Clear liquids from MN until--- 0945 Medications to take morning of surgery ----- NONE Diabetic medication ----- do not take glipizide morning of surgery Patient Special Instructions ----- n/a Pre-Op special Istructions ----- pt has telephone cardiac clearance by Gillian Shields NP on 08-22-2020 in epic / chart. Patient verbalized understanding of instructions that were given at this phone interview. Patient denies shortness of breath, chest pain, fever, cough at this phone interview.   Anesthesia Review:  HTN;  CAD s/p CABG x3 2014;  Mild AV stenosis (valve area 2.01cm^2);  PVD/ PAD s/p AAA/ bi-iliac bypass graft 06/ 2021;  Bilateral carotid stenosis, total occlusion left ICA/ right ICA 40-59%;  Known bilateral infrainguinal arterial occlusive disease;  DM2;  CKD3;  Pt denies any cardiac s&s, sob, and no peripheral swelling.  Pt does state has claudication LLE when walks/ go up stairs.  Cardiac clearance in epic.  PCP:  Dr Sherril Croon Cardiologist :  Dr Antoine Poche [facility] 02-02-2020 epic) Vascular:  Dr Edilia Bo (lov 06-01-2020 epic) Chest x-ray :  02-10-2020 epic EKG :  02-09-2020 epic Echo : 03-19-2020 epic Stress test: no Cardiac Cath :  02-05-2013 epic Activity level:  Denies sob w/ any activity Sleep Study/ CPAP :  NO Fasting Blood Sugar :  118--130    / Checks Blood Sugar -- times a day:   Daily in AM Blood Thinner/ Instructions [place] Small Dose: NO ASA / Instructions/ Last Dose :  NO

## 2020-09-08 NOTE — Progress Notes (Signed)
Spoke with Jodell Cipro pa ok to proceed per Jodell Cipro pa

## 2020-09-08 NOTE — H&P (Signed)
I have swelling in my scrotum.  HPI: Tony [name] is a 80 year-old male patient who was referred by Dr. Ignatius Specking, MD who is here for scrotal swelling.    CC: Tony [name] is a 80 yo WM who is sent in consultation by Dr. Sherril Croon for a hydrocele. He had a AAA repair in June and they got more swollen but have improved some since. He has no voiding complaints. He has no prior GU history.      ALLERGIES: Aspirin - aggravates gout Statins - muscle pain    MEDICATIONS: Benazepril Hcl 10 mg tablet  Glipizide 5 mg tablet  Praluent Pen 150 mg/ml pen injector  Voltaren Arthritis Pain 1 % gel     GU PSH: None     PSH Notes: aortic endarterectomy6-15-2021, cervical spine surgery   NON-GU PSH: Abdominal aortic aneurysm repair (open) - 02/09/2020 Back surgery CABG (coronary artery bypass grafting) - 2014 Shoulder Surgery (Unspecified), Right - 2019     GU PMH: None   NON-GU PMH: Arthritis Cardiac murmur, unspecified Diabetes Type 2 Gout Heart disease, unspecified Hypercholesterolemia    FAMILY HISTORY: Heart Disease - Father, Mother    Notes: No children   SOCIAL HISTORY: Marital Status: Married Preferred Language: English; Race: White Current Smoking Status: Patient does not smoke anymore. Has not smoked since 07/27/1992. Smoked for 40 years. Smoked 3 packs per day.   Tobacco Use Assessment Completed: Used Tobacco in last 30 days? Has never drank.  Drinks 4+ caffeinated drinks per day. Patient's occupation is/was retired.    REVIEW OF SYSTEMS:    GU Review Male:   Patient denies frequent urination, hard to postpone urination, burning/ pain with urination, get up at night to urinate, leakage of urine, stream starts and stops, trouble starting your stream, have to strain to urinate , erection problems, and penile pain.  Gastrointestinal (Upper):   Patient denies nausea, vomiting, and indigestion/ heartburn.  Gastrointestinal (Lower):   Patient denies diarrhea and constipation.   Constitutional:   Patient denies fever, night sweats, weight loss, and fatigue.  Skin:   Patient denies skin rash/ lesion and itching.  Eyes:   Patient denies blurred vision and double vision.  Ears/ Nose/ Throat:   Patient denies sore throat and sinus problems.  Hematologic/Lymphatic:   Patient denies swollen glands and easy bruising.  Cardiovascular:   Patient reports leg swelling. Patient denies chest pains.  Respiratory:   Patient denies cough and shortness of breath.  Endocrine:   Patient denies excessive thirst.  Musculoskeletal:   Patient reports back pain. Patient denies joint pain.  Neurological:   Patient denies headaches and dizziness.  Psychologic:   Patient denies depression and anxiety.   VITAL SIGNS:      08/18/2020 02:50 PM  Weight 212 lb / 96.16 kg  Height 71 in / 180.34 cm  BP 166/81 mmHg  Pulse 80 /min  Temperature 98.2 F / 36.7 C  BMI 29.6 kg/m   GU PHYSICAL EXAMINATION:    Scrotum: No lesions. No edema. No cysts. No warts.  Epididymides: not palpable  Testes: 5+ cm hydrocele left testis. 5+ cm hydrocele right testis. testes and epididymis impalpable.   Urethral Meatus: Normal size. No lesion, no wart, no discharge, no polyp. Normal location.  Penis: Penis uncircumcised. No foreskin warts, no cracks. No dorsal peyronie's plaques, no left corporal peyronie's plaques, no right corporal peyronie's plaques, no scarring, no shaft warts. No balanitis, no meatal stenosis.    MULTI-SYSTEM PHYSICAL EXAMINATION:  Constitutional: Well-nourished. No physical deformities. Normally developed. Good grooming.  Neck: Neck symmetrical, not swollen. Normal tracheal position.  Respiratory: Normal breath sounds. No labored breathing, no use of accessory muscles.   Cardiovascular: Regular rate and rhythm. No murmur, no gallop. SEM noted  Lymphatic: No enlargement, no tenderness of groin lymph nodes.  Skin: No paleness, no jaundice, no cyanosis. No lesion, no ulcer, no rash.   Neurologic / Psychiatric: Oriented to time, oriented to place, oriented to person. No depression, no anxiety, no agitation.  Gastrointestinal: No hernia. No mass, no tenderness, no rigidity, non obese abdomen.   Musculoskeletal: Normal gait and station of head and neck.     Complexity of Data:  Lab Test Review:   BMP, CBC with Diff  Records Review:   Previous Doctor Records  X-Ray Review: Scrotal Ultrasound: Reviewed Films. Discussed With Patient.  C.T. Abdomen/Pelvis: Reviewed Films. Reviewed Report. Bilateral hydroceles on CT in May 2021 but partially imaged.    Notes:                     Cr was normal in 6/21. He had a mild anemia with a Hgb of 10.7 post AAA repair. Marland Kitchen    PROCEDURES:         Scrotal Ultrasound - 42876  Right Testicle: Length: 4.2 cm  Height: 2.2 cm  Width: 2.3 cm  Left Testicle: Length: 5.1 cm  Height: 2.1 cm  Width: 2.5 cm  Left Testis/Epididymis:  Testicle within normal limits. Large left hydrocele. 10+ cm  Right Testis/Epididymis:  Testicle within normal limits. Large hydrocele 10+cm      Patient confirmed No Neulasta OnPro Device.     ASSESSMENT:      ICD-10 Details  1 GU:   Hydrocele - N43.0 Chronic, Worsening - He has bilateral hydroceles that are getting in the way and he would like them repaired. I reviewed the risks of bleeding, infection, testicular injury, recurrence, pain, residual swelling, thrombotic events and anesthetic complicaitons.    PLAN:           Orders X-Rays: Scrotal Ultrasound          Schedule Return Visit/Planned Activity: Next Available Appointment - Schedule Surgery  Procedure: Unspecified Date - Hydrocele repair - [phone], bilateral          Document Letter(s):  Created for Patient: Clinical Summary         Notes:   CC: Dr. Doreen Beam

## 2020-09-08 NOTE — H&P (View-Only) (Signed)
I have swelling in my scrotum.  HPI: Tony [name] is a 80 year-old male patient who was referred by Dr. Ignatius Specking, MD who is here for scrotal swelling.    CC: Tony [name] is a 80 yo WM who is sent in consultation by Dr. Sherril Croon for a hydrocele. He had a AAA repair in June and they got more swollen but have improved some since. He has no voiding complaints. He has no prior GU history.      ALLERGIES: Aspirin - aggravates gout Statins - muscle pain    MEDICATIONS: Benazepril Hcl 10 mg tablet  Glipizide 5 mg tablet  Praluent Pen 150 mg/ml pen injector  Voltaren Arthritis Pain 1 % gel     GU PSH: None     PSH Notes: aortic endarterectomy6-15-2021, cervical spine surgery   NON-GU PSH: Abdominal aortic aneurysm repair (open) - 02/09/2020 Back surgery CABG (coronary artery bypass grafting) - 2014 Shoulder Surgery (Unspecified), Right - 2019     GU PMH: None   NON-GU PMH: Arthritis Cardiac murmur, unspecified Diabetes Type 2 Gout Heart disease, unspecified Hypercholesterolemia    FAMILY HISTORY: Heart Disease - Father, Mother    Notes: No children   SOCIAL HISTORY: Marital Status: Married Preferred Language: English; Race: White Current Smoking Status: Patient does not smoke anymore. Has not smoked since 07/27/1992. Smoked for 40 years. Smoked 3 packs per day.   Tobacco Use Assessment Completed: Used Tobacco in last 30 days? Has never drank.  Drinks 4+ caffeinated drinks per day. Patient's occupation is/was retired.    REVIEW OF SYSTEMS:    GU Review Male:   Patient denies frequent urination, hard to postpone urination, burning/ pain with urination, get up at night to urinate, leakage of urine, stream starts and stops, trouble starting your stream, have to strain to urinate , erection problems, and penile pain.  Gastrointestinal (Upper):   Patient denies nausea, vomiting, and indigestion/ heartburn.  Gastrointestinal (Lower):   Patient denies diarrhea and constipation.   Constitutional:   Patient denies fever, night sweats, weight loss, and fatigue.  Skin:   Patient denies skin rash/ lesion and itching.  Eyes:   Patient denies blurred vision and double vision.  Ears/ Nose/ Throat:   Patient denies sore throat and sinus problems.  Hematologic/Lymphatic:   Patient denies swollen glands and easy bruising.  Cardiovascular:   Patient reports leg swelling. Patient denies chest pains.  Respiratory:   Patient denies cough and shortness of breath.  Endocrine:   Patient denies excessive thirst.  Musculoskeletal:   Patient reports back pain. Patient denies joint pain.  Neurological:   Patient denies headaches and dizziness.  Psychologic:   Patient denies depression and anxiety.   VITAL SIGNS:      08/18/2020 02:50 PM  Weight 212 lb / 96.16 kg  Height 71 in / 180.34 cm  BP 166/81 mmHg  Pulse 80 /min  Temperature 98.2 F / 36.7 C  BMI 29.6 kg/m   GU PHYSICAL EXAMINATION:    Scrotum: No lesions. No edema. No cysts. No warts.  Epididymides: not palpable  Testes: 5+ cm hydrocele left testis. 5+ cm hydrocele right testis. testes and epididymis impalpable.   Urethral Meatus: Normal size. No lesion, no wart, no discharge, no polyp. Normal location.  Penis: Penis uncircumcised. No foreskin warts, no cracks. No dorsal peyronie's plaques, no left corporal peyronie's plaques, no right corporal peyronie's plaques, no scarring, no shaft warts. No balanitis, no meatal stenosis.    MULTI-SYSTEM PHYSICAL EXAMINATION:  Constitutional: Well-nourished. No physical deformities. Normally developed. Good grooming.  Neck: Neck symmetrical, not swollen. Normal tracheal position.  Respiratory: Normal breath sounds. No labored breathing, no use of accessory muscles.   Cardiovascular: Regular rate and rhythm. No murmur, no gallop. SEM noted  Lymphatic: No enlargement, no tenderness of groin lymph nodes.  Skin: No paleness, no jaundice, no cyanosis. No lesion, no ulcer, no rash.   Neurologic / Psychiatric: Oriented to time, oriented to place, oriented to person. No depression, no anxiety, no agitation.  Gastrointestinal: No hernia. No mass, no tenderness, no rigidity, non obese abdomen.   Musculoskeletal: Normal gait and station of head and neck.     Complexity of Data:  Lab Test Review:   BMP, CBC with Diff  Records Review:   Previous Doctor Records  X-Ray Review: Scrotal Ultrasound: Reviewed Films. Discussed With Patient.  C.T. Abdomen/Pelvis: Reviewed Films. Reviewed Report. Bilateral hydroceles on CT in May 2021 but partially imaged.    Notes:                     Cr was normal in 6/21. He had a mild anemia with a Hgb of 10.7 post AAA repair. .    PROCEDURES:         Scrotal Ultrasound - 76870  Right Testicle: Length: 4.2 cm  Height: 2.2 cm  Width: 2.3 cm  Left Testicle: Length: 5.1 cm  Height: 2.1 cm  Width: 2.5 cm  Left Testis/Epididymis:  Testicle within normal limits. Large left hydrocele. 10+ cm  Right Testis/Epididymis:  Testicle within normal limits. Large hydrocele 10+cm      Patient confirmed No Neulasta OnPro Device.     ASSESSMENT:      ICD-10 Details  1 GU:   Hydrocele - N43.0 Chronic, Worsening - He has bilateral hydroceles that are getting in the way and he would like them repaired. I reviewed the risks of bleeding, infection, testicular injury, recurrence, pain, residual swelling, thrombotic events and anesthetic complicaitons.    PLAN:           Orders X-Rays: Scrotal Ultrasound          Schedule Return Visit/Planned Activity: Next Available Appointment - Schedule Surgery  Procedure: Unspecified Date - Hydrocele repair - 55040, bilateral          Document Letter(s):  Created for Patient: Clinical Summary         Notes:   CC: Dr. Dhruv Vyas   

## 2020-09-09 ENCOUNTER — Ambulatory Visit (HOSPITAL_BASED_OUTPATIENT_CLINIC_OR_DEPARTMENT_OTHER): Payer: Medicare HMO | Admitting: Physician Assistant

## 2020-09-09 ENCOUNTER — Ambulatory Visit (HOSPITAL_BASED_OUTPATIENT_CLINIC_OR_DEPARTMENT_OTHER)
Admission: RE | Admit: 2020-09-09 | Discharge: 2020-09-09 | Disposition: A | Discharge status: Home / Self Care | Payer: Medicare HMO | Attending: Urology | Admitting: Urology

## 2020-09-09 ENCOUNTER — Encounter (HOSPITAL_BASED_OUTPATIENT_CLINIC_OR_DEPARTMENT_OTHER): Payer: Self-pay | Admitting: Urology

## 2020-09-09 ENCOUNTER — Encounter (HOSPITAL_BASED_OUTPATIENT_CLINIC_OR_DEPARTMENT_OTHER)
Admission: RE | Disposition: A | Discharge status: Home / Self Care | Payer: Self-pay | Source: Home / Self Care | Attending: Urology

## 2020-09-09 DIAGNOSIS — N433 Hydrocele, unspecified: Secondary | ICD-10-CM | POA: Diagnosis not present

## 2020-09-09 DIAGNOSIS — Z79899 Other long term (current) drug therapy: Secondary | ICD-10-CM | POA: Diagnosis not present

## 2020-09-09 DIAGNOSIS — I25110 Atherosclerotic heart disease of native coronary artery with unstable angina pectoris: Secondary | ICD-10-CM | POA: Diagnosis not present

## 2020-09-09 DIAGNOSIS — N430 Encysted hydrocele: Secondary | ICD-10-CM | POA: Diagnosis not present

## 2020-09-09 DIAGNOSIS — E785 Hyperlipidemia, unspecified: Secondary | ICD-10-CM | POA: Diagnosis not present

## 2020-09-09 DIAGNOSIS — Z87891 Personal history of nicotine dependence: Secondary | ICD-10-CM | POA: Diagnosis not present

## 2020-09-09 DIAGNOSIS — I10 Essential (primary) hypertension: Secondary | ICD-10-CM | POA: Diagnosis not present

## 2020-09-09 DIAGNOSIS — Z951 Presence of aortocoronary bypass graft: Secondary | ICD-10-CM | POA: Insufficient documentation

## 2020-09-09 DIAGNOSIS — Z7984 Long term (current) use of oral hypoglycemic drugs: Secondary | ICD-10-CM | POA: Insufficient documentation

## 2020-09-09 HISTORY — DX: Nonrheumatic aortic (valve) stenosis: I35.0

## 2020-09-09 HISTORY — DX: Type 2 diabetes mellitus without complications: E11.9

## 2020-09-09 HISTORY — DX: Hydrocele, unspecified: N43.3

## 2020-09-09 HISTORY — PX: HYDROCELE EXCISION: SHX482

## 2020-09-09 HISTORY — DX: Occlusion and stenosis of bilateral carotid arteries: I65.23

## 2020-09-09 HISTORY — DX: Peripheral vascular disease, unspecified: I73.9

## 2020-09-09 HISTORY — DX: Chronic kidney disease, stage 3 unspecified: N18.30

## 2020-09-09 HISTORY — DX: Personal history of other diseases of the musculoskeletal system and connective tissue: Z87.39

## 2020-09-09 HISTORY — DX: Presence of spectacles and contact lenses: Z97.3

## 2020-09-09 LAB — POCT I-STAT, CHEM 8
BUN: 21 mg/dL (ref 8–23)
Calcium, Ion: 1.19 mmol/L (ref 1.15–1.40)
Chloride: 103 mmol/L (ref 98–111)
Creatinine, Ser: 1.2 mg/dL (ref 0.61–1.24)
Glucose, Bld: 113 mg/dL — ABNORMAL HIGH (ref 70–99)
HCT: 46 % (ref 39.0–52.0)
Hemoglobin: 15.6 g/dL (ref 13.0–17.0)
Potassium: 4 mmol/L (ref 3.5–5.1)
Sodium: 137 mmol/L (ref 135–145)
TCO2: 24 mmol/L (ref 22–32)

## 2020-09-09 SURGERY — HYDROCELECTOMY
Anesthesia: General | Site: Scrotum | Laterality: Bilateral

## 2020-09-09 MED ORDER — BUPIVACAINE HCL (PF) 0.25 % IJ SOLN
INTRAMUSCULAR | Status: DC | PRN
  Administered 2020-09-09: 11 mL

## 2020-09-09 MED ORDER — ACETAMINOPHEN 325 MG PO TABS: 325.0000 mg | ORAL_TABLET | ORAL | Status: DC | PRN

## 2020-09-09 MED ORDER — FENTANYL CITRATE (PF) 100 MCG/2ML IJ SOLN
INTRAMUSCULAR | Status: AC
  Filled 2020-09-09: qty 2

## 2020-09-09 MED ORDER — DEXAMETHASONE SODIUM PHOSPHATE 4 MG/ML IJ SOLN
INTRAMUSCULAR | Status: DC | PRN
  Administered 2020-09-09: 6 mg via INTRAVENOUS

## 2020-09-09 MED ORDER — FENTANYL CITRATE (PF) 100 MCG/2ML IJ SOLN: 25.0000 ug | INTRAMUSCULAR | Status: DC | PRN

## 2020-09-09 MED ORDER — HYDROCODONE-ACETAMINOPHEN 5-325 MG PO TABS: 1.0000 | ORAL_TABLET | Freq: Four times a day (QID) | ORAL | 0 refills | Status: DC | PRN

## 2020-09-09 MED ORDER — CEFAZOLIN SODIUM-DEXTROSE 2-4 GM/100ML-% IV SOLN
INTRAVENOUS | Status: AC
  Filled 2020-09-09: qty 100

## 2020-09-09 MED ORDER — ONDANSETRON HCL 4 MG/2ML IJ SOLN: 4.0000 mg | Freq: Once | INTRAMUSCULAR | Status: DC | PRN

## 2020-09-09 MED ORDER — LIDOCAINE 2% (20 MG/ML) 5 ML SYRINGE
INTRAMUSCULAR | Status: DC | PRN
  Administered 2020-09-09: 60 mg via INTRAVENOUS

## 2020-09-09 MED ORDER — MEPERIDINE HCL 25 MG/ML IJ SOLN: 6.2500 mg | INTRAMUSCULAR | Status: DC | PRN

## 2020-09-09 MED ORDER — ACETAMINOPHEN 160 MG/5ML PO SOLN: 325.0000 mg | ORAL | Status: DC | PRN

## 2020-09-09 MED ORDER — OXYCODONE HCL 5 MG PO TABS: 5.0000 mg | ORAL_TABLET | Freq: Once | ORAL | Status: DC | PRN

## 2020-09-09 MED ORDER — ONDANSETRON HCL 4 MG/2ML IJ SOLN
INTRAMUSCULAR | Status: DC | PRN
  Administered 2020-09-09: 4 mg via INTRAVENOUS

## 2020-09-09 MED ORDER — CEFAZOLIN SODIUM-DEXTROSE 2-4 GM/100ML-% IV SOLN
2.0000 g | INTRAVENOUS | Status: AC
  Administered 2020-09-09: 2 g via INTRAVENOUS

## 2020-09-09 MED ORDER — OXYCODONE HCL 5 MG/5ML PO SOLN: 5.0000 mg | Freq: Once | ORAL | Status: DC | PRN

## 2020-09-09 MED ORDER — SODIUM CHLORIDE 0.9% FLUSH: 3.0000 mL | Freq: Two times a day (BID) | INTRAVENOUS | Status: DC

## 2020-09-09 MED ORDER — PHENYLEPHRINE 40 MCG/ML (10ML) SYRINGE FOR IV PUSH (FOR BLOOD PRESSURE SUPPORT)
PREFILLED_SYRINGE | INTRAVENOUS | Status: DC | PRN
  Administered 2020-09-09: 80 ug via INTRAVENOUS
  Administered 2020-09-09: 120 ug via INTRAVENOUS
  Administered 2020-09-09: 80 ug via INTRAVENOUS
  Administered 2020-09-09: 120 ug via INTRAVENOUS

## 2020-09-09 MED ORDER — EPHEDRINE SULFATE-NACL 50-0.9 MG/10ML-% IV SOSY
PREFILLED_SYRINGE | INTRAVENOUS | Status: DC | PRN
  Administered 2020-09-09: 15 mg via INTRAVENOUS
  Administered 2020-09-09: 10 mg via INTRAVENOUS
  Administered 2020-09-09: 15 mg via INTRAVENOUS

## 2020-09-09 MED ORDER — PROPOFOL 10 MG/ML IV BOLUS
INTRAVENOUS | Status: DC | PRN
  Administered 2020-09-09: 150 mg via INTRAVENOUS

## 2020-09-09 MED ORDER — 0.9 % SODIUM CHLORIDE (POUR BTL) OPTIME
TOPICAL | Status: DC | PRN
  Administered 2020-09-09: 500 mL

## 2020-09-09 MED ORDER — SODIUM CHLORIDE 0.9 % IV SOLN: INTRAVENOUS | Status: DC

## 2020-09-09 MED ORDER — FENTANYL CITRATE (PF) 100 MCG/2ML IJ SOLN
INTRAMUSCULAR | Status: DC | PRN
  Administered 2020-09-09: 50 ug via INTRAVENOUS

## 2020-09-09 SURGICAL SUPPLY — 39 items
BLADE CLIPPER SENSICLIP SURGIC (BLADE) ×2 IMPLANT
BLADE SURG 15 STRL LF DISP TIS (BLADE) ×1 IMPLANT
BLADE SURG 15 STRL SS (BLADE) ×2
BNDG COHESIVE 2X5 TAN NS LF (GAUZE/BANDAGES/DRESSINGS) ×2 IMPLANT
BNDG COHESIVE 2X5 TAN STRL LF (GAUZE/BANDAGES/DRESSINGS) ×2 IMPLANT
BNDG GAUZE ELAST 4 BULKY (GAUZE/BANDAGES/DRESSINGS) ×2 IMPLANT
CANISTER SUCT 1200ML W/VALVE (MISCELLANEOUS) IMPLANT
CANISTER SUCT 3000ML PPV (MISCELLANEOUS) IMPLANT
CLEANER CAUTERY TIP 5X5 PAD (MISCELLANEOUS) IMPLANT
COVER BACK TABLE 60X90IN (DRAPES) ×2 IMPLANT
COVER MAYO STAND STRL (DRAPES) ×2 IMPLANT
COVER WAND RF STERILE (DRAPES) ×2 IMPLANT
DISSECTOR ROUND CHERRY 3/8 STR (MISCELLANEOUS) IMPLANT
DRAIN PENROSE 0.25X18 (DRAIN) ×2 IMPLANT
DRAPE LAPAROTOMY 100X72 PEDS (DRAPES) ×2 IMPLANT
ELECT REM PT RETURN 9FT ADLT (ELECTROSURGICAL) ×2
ELECTRODE REM PT RTRN 9FT ADLT (ELECTROSURGICAL) ×1 IMPLANT
GAUZE SPONGE 4X4 12PLY STRL LF (GAUZE/BANDAGES/DRESSINGS) ×2 IMPLANT
GLOVE SURG SS PI 8.0 STRL IVOR (GLOVE) ×2 IMPLANT
GOWN STRL REUS W/ TWL LRG LVL3 (GOWN DISPOSABLE) ×1 IMPLANT
GOWN STRL REUS W/ TWL XL LVL3 (GOWN DISPOSABLE) ×1 IMPLANT
GOWN STRL REUS W/TWL LRG LVL3 (GOWN DISPOSABLE) ×2
GOWN STRL REUS W/TWL XL LVL3 (GOWN DISPOSABLE) ×2
KIT TURNOVER CYSTO (KITS) ×2 IMPLANT
MANIFOLD NEPTUNE II (INSTRUMENTS) ×2 IMPLANT
NEEDLE HYPO 25X1 1.5 SAFETY (NEEDLE) ×2 IMPLANT
NS IRRIG 500ML POUR BTL (IV SOLUTION) ×2 IMPLANT
PACK BASIN DAY SURGERY FS (CUSTOM PROCEDURE TRAY) ×2 IMPLANT
PAD CLEANER CAUTERY TIP 5X5 (MISCELLANEOUS)
PENCIL SMOKE EVACUATOR (MISCELLANEOUS) ×2 IMPLANT
SUPPORT SCROTAL LG STRP (MISCELLANEOUS) ×2 IMPLANT
SUT CHROMIC 0 SH (SUTURE) IMPLANT
SUT CHROMIC 3 0 SH 27 (SUTURE) ×10 IMPLANT
SYR CONTROL 10ML LL (SYRINGE) ×2 IMPLANT
TOWEL OR 17X26 10 PK STRL BLUE (TOWEL DISPOSABLE) ×2 IMPLANT
TRAY DSU PREP LF (CUSTOM PROCEDURE TRAY) ×2 IMPLANT
TUBE CONNECTING 12X1/4 (SUCTIONS) ×2 IMPLANT
WATER STERILE IRR 500ML POUR (IV SOLUTION) IMPLANT
YANKAUER SUCT BULB TIP NO VENT (SUCTIONS) ×2 IMPLANT

## 2020-09-09 NOTE — Discharge Instructions (Signed)
Current Urology, 10(1), 1-14. [email or web address]">  Hydrocelectomy, Adult, Care After This sheet gives you information about how to care for yourself after your procedure. Your health care provider may also give you more specific instructions. If you have problems or questions, contact your health care provider. What can I expect after the procedure? After your procedure, it is common to have:  Mild discomfort and swelling in the pouch that holds your testicles (scrotum).  Bruising of the scrotum. Follow these instructions at home: Medicines  Take over-the-counter and prescription medicines only as told by your health care provider.  Ask your health care provider if the medicine prescribed to you: ? Requires you to avoid driving or using heavy machinery. ? Can cause constipation. You may need to take these actions to prevent or treat constipation:  Drink enough fluid to keep your urine pale yellow.  Take over-the-counter or prescription medicines.  Eat foods that are high in fiber, such as beans, whole grains, and fresh fruits and vegetables.  Limit foods that are high in fat and processed sugars, such as fried or sweet foods. Bathing  Do not take baths, swim, or use a hot tub until your health care provider approves. Ask your health care provider if you may take showers. You may only be allowed to take sponge baths.  If you were told to wear an athletic support strap (scrotal support), keep it dry. Take it off when you shower or bathe. Incision care  Follow instructions from your health care provider about how to take care of your incision. Make sure you: ? Wash your hands with soap and water before and after you change your bandage (dressing). If soap and water are not available, use hand sanitizer. ? Change your dressing as told by your health care provider. ? Leave stitches (sutures), skin glue, or adhesive strips in place. These skin closures may need to stay in  place for 2 weeks or longer. If adhesive strip edges start to loosen and curl up, you may trim the loose edges. Do not remove adhesive strips completely unless your health care provider tells you to do that.  Check your incision and scrotum every day for signs of infection. Check for: ? More redness, swelling, or pain. ? Fluid or blood. ? Warmth. ? Pus or a bad smell.   Managing pain and swelling If directed, put ice on the affected area. To do this:  Put ice in a plastic bag.  Place a towel between your skin and the bag.  Leave the ice on for 20 minutes, 2-3 times per day.   Activity  Do not do any high-energy activities for as long as told by your health care provider.  Do not lift anything that is heavier than 10 lb (4.5 kg), or the limit that you are told, until your health care provider says that it is safe.  Return to your normal activities as told by your health care provider. Ask your health care provider what activities are safe for you.  Do not drive for 24 hours if you were given a sedative during your procedure.  Ask your health care provider when it is safe to drive. General instructions  Do not use any products that contain nicotine or tobacco, such as cigarettes, e-cigarettes, and chewing tobacco. These can delay incision healing after surgery. If you need help quitting, ask your health care provider.  If you were given a scrotal support, wear it as told by your health care provider.  If you had a drain put in during the procedure, you will need to have it removed at a follow-up visit.  Keep all follow-up visits as told by your health care provider. This is important. Contact a health care provider if:  Your pain is not controlled with medicine.  You have more redness, swelling, or pain around your scrotum.  You have fluid or blood coming from your incision.  Your incision feels warm to the touch.  You have pus or a bad smell coming from your  scrotum.  You have a fever. Get help right away if:  You develop shaking, chills, and a fever that is higher than 101.59F (38.8C).  You have redness or swelling that starts at your scrotum and spreads outward to cover your whole groin.  You develop swelling of the legs or difficulty breathing. Summary  After a hydrocelectomy, it is common to have mild discomfort, swelling, and bruising.  Do not take baths, swim, or use a hot tub until your health care provider approves. Ask your health care provider if you may take showers.  If directed, put ice on the affected area to help with pain and swelling.  Do not do any high-energy activities or lift anything heavier than 10 lb (4.5 kg) for as long as told by your health care provider.  If you were given a scrotal support, keep it dry. Wear the scrotal support as told by your health care provider.  You may remove the dressing in the morning and then remove the drain.  It will be about 8-10 inches long and should just slide out.  If you have difficulty removing the drain, keep a dressing on it and call us for an appointment on Monday morning.  This information is not intended to replace advice given to you by your health care provider. Make sure you discuss any questions you have with your health care provider. Document Revised: 01/06/2019 Document Reviewed: 01/06/2019 Elsevier Patient Education  2021 [employer]    Post Anesthesia Home Care Instructions  Activity: Get plenty of rest for the remainder of the day. A responsible individual must stay with you for 24 hours following the procedure.  For the next 24 hours, DO NOT: -Drive a car -Advertising copywriter -Drink alcoholic beverages -Take any medication unless instructed by your physician -Make any legal decisions or sign important papers.  Meals: Start with liquid foods such as gelatin or soup. Progress to regular foods as tolerated. Avoid greasy, spicy, heavy foods. If nausea  and/or vomiting occur, drink only clear liquids until the nausea and/or vomiting subsides. Call your physician if vomiting continues.  Special Instructions/Symptoms: Your throat may feel dry or sore from the anesthesia or the breathing tube placed in your throat during surgery. If this causes discomfort, gargle with warm salt water. The discomfort should disappear within 24 hours.  If you had a scopolamine patch placed behind your ear for the management of post- operative nausea and/or vomiting:  1. The medication in the patch is effective for 72 hours, after which it should be removed.  Wrap patch in a tissue and discard in the trash. Wash hands thoroughly with soap and water. 2. You may remove the patch earlier than 72 hours if you experience unpleasant side effects which may include dry mouth, dizziness or visual disturbances. 3. Avoid touching the patch. Wash your hands with soap and water after contact with the patch.

## 2020-09-09 NOTE — Anesthesia Preprocedure Evaluation (Addendum)
Anesthesia Evaluation  Patient identified by MRN, date of birth, ID band Patient awake    Reviewed: Allergy & Precautions, NPO status , Patient's Chart, lab work & pertinent test results  Airway Mallampati: IV  TM Distance: >3 FB Neck ROM: Full    Dental  (+) Edentulous Upper, Edentulous Lower   Pulmonary former smoker,    Pulmonary exam normal        Cardiovascular hypertension, Pt. on medications + angina + CAD, + CABG and + Peripheral Vascular Disease  Normal cardiovascular exam + Systolic murmurs    Neuro/Psych negative neurological ROS  negative psych ROS   GI/Hepatic negative GI ROS, Neg liver ROS,   Endo/Other  diabetes, Type 2, Oral Hypoglycemic Agents  Renal/GU      Musculoskeletal   Abdominal Normal abdominal exam  (+)   Peds  Hematology negative hematology ROS (+)   Anesthesia Other Findings Left ventricular ejection fraction, by estimation, is 60 to 65%. The left ventricle has normal function. The left ventricle has no regional wall motion abnormalities. There is mild left ventricular hypertrophy. Left ventricular diastolic parameters are consistent with Grade I diastolic dysfunction (impaired relaxation). 2. Right ventricular systolic function is normal. The right ventricular size is normal. 3. The mitral valve is normal in structure. Trivial mitral valve regurgitation. No evidence of mitral stenosis. 4. The aortic valve is tricuspid. Aortic valve regurgitation is not visualized. Mild aortic valve stenosis. 5. Aortic dilatation noted. There is mild dilatation of the aortic root measuring 39 mm. Left Ventricle  Reproductive/Obstetrics                             Anesthesia Physical  Anesthesia Plan  ASA: III  Anesthesia Plan: General   Post-op Pain Management:    Induction: Intravenous  PONV Risk Score and Plan: 4 or greater and Ondansetron and Treatment may vary due  to age or medical condition  Airway Management Planned: LMA  Additional Equipment:   Intra-op Plan:   Post-operative Plan:   Informed Consent: I have reviewed the patients History and Physical, chart, labs and discussed the procedure including the risks, benefits and alternatives for the proposed anesthesia with the patient or authorized representative who has indicated his/her understanding and acceptance.     Dental advisory given  Plan Discussed with: CRNA  Anesthesia Plan Comments: (PAT note written 02/08/2020 by Shonna Chock, PA-C. )        Anesthesia Quick Evaluation

## 2020-09-09 NOTE — Anesthesia Procedure Notes (Signed)
Procedure Name: LMA Insertion Date/Time: 09/09/2020 11:36 AM Performed by: Caren Macadam, CRNA Pre-anesthesia Checklist: Patient identified, Emergency Drugs available, Suction available and Patient being monitored Patient Re-evaluated:Patient Re-evaluated prior to induction Oxygen Delivery Method: Circle system utilized Preoxygenation: Pre-oxygenation with 100% oxygen Induction Type: IV induction Ventilation: Mask ventilation without difficulty LMA: LMA inserted LMA Size: 5.0 Number of attempts: 1 Placement Confirmation: positive ETCO2 and breath sounds checked- equal and bilateral Tube secured with: Tape Dental Injury: Teeth and Oropharynx as per pre-operative assessment

## 2020-09-09 NOTE — Transfer of Care (Signed)
Immediate Anesthesia Transfer of Care Note  Patient: Tony [name]  Procedure(s) Performed: BILATERAL HYDROCELECTOMY ADULT (Bilateral Scrotum)  Patient Location: PACU  Anesthesia Type:General  Level of Consciousness: drowsy  Airway & Oxygen Therapy: Patient Spontanous Breathing and Patient connected to face mask oxygen  Post-op Assessment: Report given to RN and Post -op Vital signs reviewed and stable  Post vital signs: Reviewed and stable  Last Vitals:  Vitals Value Taken Time  BP 138/64 09/09/20 1246  Temp    Pulse 81 09/09/20 1250  Resp 18 09/09/20 1250  SpO2 99 % 09/09/20 1250  Vitals shown include unvalidated device data.  Last Pain:  Vitals:   09/09/20 1038  TempSrc: Oral  PainSc: 0-No pain      Patients Stated Pain Goal: 4 (09/09/20 1038)  Complications: No complications documented.

## 2020-09-09 NOTE — Anesthesia Postprocedure Evaluation (Signed)
Anesthesia Post Note  Patient: Tony [name]  Procedure(s) Performed: BILATERAL HYDROCELECTOMY ADULT (Bilateral Scrotum)     Patient location during evaluation: PACU Anesthesia Type: General Level of consciousness: awake Pain management: pain level controlled Vital Signs Assessment: post-procedure vital signs reviewed and stable Respiratory status: spontaneous breathing Cardiovascular status: stable Postop Assessment: no apparent nausea or vomiting Anesthetic complications: no   No complications documented.  Last Vitals:  Vitals:   09/09/20 1315 09/09/20 1339  BP: 136/73   Pulse: 74 75  Resp: 18 20  Temp:    SpO2: 94% 94%    Last Pain:  Vitals:   09/09/20 1339  TempSrc:   PainSc: 0-No pain   Pain Goal: Patients Stated Pain Goal: 4 (09/09/20 1038)                 Caren Macadam

## 2020-09-09 NOTE — Op Note (Signed)
Procedure: Bilateral hydrocelectomy.  Pre-op diagnosis: Bilateral hydroceles.  Postop diagnosis: Same.  Surgeon: Dr. Bjorn Pippin.  Anesthesia: General.  Specimen: Hydrocele sac from the right.  Drain: Copy drain.  EBL: 10 mL.  Complications: None.  Indications: The patient is a 80 year old male with chronic symptomatic bilateral hydroceles he is elected hydrocelectomy.  Procedure: He was given 2 g of Ancef.  A general anesthetic was induced.  He was placed in the supine position and fitted with PAS hose.  His scrotum was clipped.  He was prepped with Betadine solution and draped in usual sterile fashion.  An anterior midline scrotal incision was made with a knife after infiltration of the anterior scrotal wall with approximately 8 mL of quarter percent Marcaine.  The Bovie was then used to incise the dartos down to the right hydrocele sac which was dissected out using sharp and blunt dissection.  The sac was opened and drained with approximately 200 mL of fluid that was clear.  The sac was quite thick consistent with chronicity.  The redundant sac was excised and the residual sac was imbricated behind the testicle in a water bottle fashion with a running locked 3-0 chromic suture.  There was some venous oozing from some vessels in the cremasteric muscle layer there required fulguration and then oversewing with 3-0 chromic.  A cord block was performed with approximately 4 mL of quarter percent Marcaine.  The left hydrocele was then delivered through the same incision.  It was drained of a similar amount of fluid.  The sac was not as thick or inflamed as on the right.  The redundant sac was excised and during excision a second smaller fluid-filled sac was identified more proximally along the cord which appeared to be a possible spermatocele.  The residual hydrocele sac was imbricated behind the testicle in a water bottle fashion with a running locked 3-0 chromic suture.  The  smaller cystic structure was then also obliterated with a 3-0 chromic suture with multiple bites to bunch of the residual epithelium.  Cord block was then performed with another 4 mL of quarter percent Marcaine.  Both sides were inspected for hemostasis and no additional bleeding was identified.  1/4 inch Penrose drain was brought through a separate stab wound in the right hemiscrotum and then passed across the septum so that a portion of the drain was then both the right and left hemiscrotum.  The drain was initially secured with a towel clip.  The dartos fascia was then closed using a running 3-0 chromic with care taken to include the septum and also care taken to avoid entrapping the drain.  The skin was then closed using a running vertical mattress 3-0 chromic.  The drain was then trimmed to an appropriate length and the towel clip was removed after the wound had been cleaned.  A dressing of 4 x 4's on the drain site and wound was then followed by a Kerlix and Coban turban dressing to provide scrotal compression.  An athletic supporter was placed.  His anesthetic was reversed and moved to recovery room in stable condition.  There were no complications.

## 2020-09-09 NOTE — Interval H&P Note (Signed)
History and Physical Interval Note:  09/09/2020 11:25 AM  Tony [name]  has presented today for surgery, with the diagnosis of BILATERAL HYDROCELES.  The various methods of treatment have been discussed with the patient and family. After consideration of risks, benefits and other options for treatment, the patient has consented to  Procedure(s): BILATERAL HYDROCELECTOMY ADULT (Bilateral) as a surgical intervention.  The patient's history has been reviewed, patient examined, no change in status, stable for surgery.  I have reviewed the patient's chart and labs.  Questions were answered to the patient's satisfaction.     Bjorn Pippin

## 2020-09-12 ENCOUNTER — Encounter (HOSPITAL_BASED_OUTPATIENT_CLINIC_OR_DEPARTMENT_OTHER): Payer: Self-pay | Admitting: Urology

## 2020-09-19 DIAGNOSIS — N430 Encysted hydrocele: Secondary | ICD-10-CM | POA: Diagnosis not present

## 2020-10-04 ENCOUNTER — Other Ambulatory Visit: Payer: Self-pay | Admitting: Urology

## 2020-10-04 ENCOUNTER — Other Ambulatory Visit: Payer: Self-pay

## 2020-10-04 ENCOUNTER — Encounter (HOSPITAL_BASED_OUTPATIENT_CLINIC_OR_DEPARTMENT_OTHER): Payer: Self-pay | Admitting: Urology

## 2020-10-04 DIAGNOSIS — N430 Encysted hydrocele: Secondary | ICD-10-CM | POA: Diagnosis not present

## 2020-10-04 NOTE — Progress Notes (Signed)
Spoke w/ via phone for pre-op interview--- PT Lab needs dos----  Istat             Lab results------ current ekg in epic/ chart COVID test ------ 10-06-2020 @ 1000 Arrive at ------- 0845 on 10-07-2020 NPO after MN NO Solid Food.  Clear liquids from MN until--- 0745 Medications to take morning of surgery ----- NONE Diabetic medication ----- do not take glipizide morning of surgery Patient Special Instructions ----- n/a Pre-Op special Istructions ----- pt has telephone cardiac clearance by Gillian Shields NP on 08-22-2020 in epic, this was for pt's surgery on 09-09-2020.  Pt stated no changes in is health history since this clearance. Patient verbalized understanding of instructions that were given at this phone interview. Patient denies shortness of breath, chest pain, fever, cough at this phone interview.   Anesthesia Review:  HTN;  CAD s/p CABG x3 2014;  Mild AV stenosis (valve area 2.01cm^2);  PVD/ PAD s/p AAA/ bi-iliac bypass graft 06/ 2021;  Bilateral carotid stenosis, total occlusion left ICA/ right ICA 40-59%;  Known bilateral infrainguinal arterial occlusive disease;  DM2;  CKD3;  Pt denies any cardiac s&s, sob, and no peripheral swelling.  Pt does state has claudication LLE when walks/ go up stairs.   PCP:  Dr Sherril Croon Cardiologist :  Dr Antoine Poche [facility] 02-02-2020 epic) Vascular:  Dr Edilia Bo (lov 06-01-2020 epic) Chest x-ray :  02-10-2020 epic EKG :  02-09-2020 epic Echo : 03-19-2020 epic Stress test: no Cardiac Cath :  02-05-2013 epic Activity level:  Denies sob w/ any activity Sleep Study/ CPAP :  NO Fasting Blood Sugar :  118--130    / Checks Blood Sugar -- times a day:   Daily in AM Blood Thinner/ Instructions Maurice Small Dose: NO ASA / Instructions/ Last Dose :  NO

## 2020-10-05 ENCOUNTER — Ambulatory Visit: Payer: Self-pay | Admitting: Vascular Surgery

## 2020-10-05 ENCOUNTER — Encounter (HOSPITAL_COMMUNITY): Payer: Medicare HMO

## 2020-10-06 ENCOUNTER — Other Ambulatory Visit (HOSPITAL_COMMUNITY)
Admission: RE | Admit: 2020-10-06 | Discharge: 2020-10-06 | Disposition: A | Discharge status: Home / Self Care | Payer: Medicare HMO | Source: Ambulatory Visit | Attending: Urology | Admitting: Urology

## 2020-10-06 DIAGNOSIS — Z01812 Encounter for preprocedural laboratory examination: Secondary | ICD-10-CM | POA: Insufficient documentation

## 2020-10-06 DIAGNOSIS — Z20822 Contact with and (suspected) exposure to covid-19: Secondary | ICD-10-CM | POA: Diagnosis not present

## 2020-10-06 LAB — SARS CORONAVIRUS 2 (TAT 6-24 HRS): SARS Coronavirus 2: NEGATIVE

## 2020-10-06 NOTE — Anesthesia Preprocedure Evaluation (Addendum)
Anesthesia Evaluation  Patient identified by MRN, date of birth, ID band Patient awake    Reviewed: Allergy & Precautions, NPO status , Patient's Chart, lab work & pertinent test results  History of Anesthesia Complications Negative for: history of anesthetic complications  Airway Mallampati: II  TM Distance: >3 FB Neck ROM: Full    Dental  (+) Edentulous Lower, Edentulous Upper   Pulmonary neg pulmonary ROS, former smoker,    Pulmonary exam normal        Cardiovascular hypertension, Pt. on medications + CAD, + CABG and + Peripheral Vascular Disease  Normal cardiovascular exam  TTE 02/2020: EF 60-65%, mild LVH, grade I diastolic dysfunction, mild AS, mild dilatation of aortic root  measuring 75mm   Neuro/Psych negative neurological ROS  negative psych ROS   GI/Hepatic negative GI ROS, Neg liver ROS,   Endo/Other  diabetes, Type 2, Oral Hypoglycemic Agents  Renal/GU Renal InsufficiencyRenal disease   hydrocele    Musculoskeletal  (+) Arthritis ,   Abdominal   Peds  Hematology negative hematology ROS (+)   Anesthesia Other Findings Day of surgery medications reviewed with patient.  Reproductive/Obstetrics negative OB ROS                            Anesthesia Physical Anesthesia Plan  ASA: III  Anesthesia Plan: General   Post-op Pain Management:    Induction: Intravenous  PONV Risk Score and Plan: 2 and Treatment may vary due to age or medical condition, Ondansetron and Dexamethasone  Airway Management Planned: LMA  Additional Equipment: None  Intra-op Plan:   Post-operative Plan: Extubation in OR  Informed Consent: I have reviewed the patients History and Physical, chart, labs and discussed the procedure including the risks, benefits and alternatives for the proposed anesthesia with the patient or authorized representative who has indicated his/her understanding and  acceptance.     Dental advisory given  Plan Discussed with: CRNA  Anesthesia Plan Comments:        Anesthesia Quick Evaluation

## 2020-10-07 ENCOUNTER — Ambulatory Visit (HOSPITAL_BASED_OUTPATIENT_CLINIC_OR_DEPARTMENT_OTHER): Payer: Medicare HMO | Admitting: Anesthesiology

## 2020-10-07 ENCOUNTER — Other Ambulatory Visit: Payer: Self-pay

## 2020-10-07 ENCOUNTER — Encounter (HOSPITAL_BASED_OUTPATIENT_CLINIC_OR_DEPARTMENT_OTHER): Payer: Self-pay | Admitting: Urology

## 2020-10-07 ENCOUNTER — Encounter (HOSPITAL_BASED_OUTPATIENT_CLINIC_OR_DEPARTMENT_OTHER)
Admission: RE | Disposition: A | Discharge status: Home / Self Care | Payer: Self-pay | Source: Home / Self Care | Attending: Urology

## 2020-10-07 ENCOUNTER — Ambulatory Visit (HOSPITAL_BASED_OUTPATIENT_CLINIC_OR_DEPARTMENT_OTHER)
Admission: RE | Admit: 2020-10-07 | Discharge: 2020-10-07 | Disposition: A | Discharge status: Home / Self Care | Payer: Medicare HMO | Attending: Urology | Admitting: Urology

## 2020-10-07 DIAGNOSIS — T8140XA Infection following a procedure, unspecified, initial encounter: Secondary | ICD-10-CM | POA: Diagnosis not present

## 2020-10-07 DIAGNOSIS — Z8679 Personal history of other diseases of the circulatory system: Secondary | ICD-10-CM | POA: Diagnosis not present

## 2020-10-07 DIAGNOSIS — Z8249 Family history of ischemic heart disease and other diseases of the circulatory system: Secondary | ICD-10-CM | POA: Diagnosis not present

## 2020-10-07 DIAGNOSIS — N5089 Other specified disorders of the male genital organs: Secondary | ICD-10-CM | POA: Diagnosis not present

## 2020-10-07 DIAGNOSIS — Z951 Presence of aortocoronary bypass graft: Secondary | ICD-10-CM | POA: Diagnosis not present

## 2020-10-07 DIAGNOSIS — Z888 Allergy status to other drugs, medicaments and biological substances status: Secondary | ICD-10-CM | POA: Diagnosis not present

## 2020-10-07 DIAGNOSIS — Z87891 Personal history of nicotine dependence: Secondary | ICD-10-CM | POA: Diagnosis not present

## 2020-10-07 DIAGNOSIS — Z886 Allergy status to analgesic agent status: Secondary | ICD-10-CM | POA: Diagnosis not present

## 2020-10-07 DIAGNOSIS — N433 Hydrocele, unspecified: Secondary | ICD-10-CM | POA: Insufficient documentation

## 2020-10-07 DIAGNOSIS — N99842 Postprocedural seroma of a genitourinary system organ or structure following a genitourinary system procedure: Secondary | ICD-10-CM | POA: Diagnosis not present

## 2020-10-07 DIAGNOSIS — E785 Hyperlipidemia, unspecified: Secondary | ICD-10-CM | POA: Diagnosis not present

## 2020-10-07 DIAGNOSIS — I6523 Occlusion and stenosis of bilateral carotid arteries: Secondary | ICD-10-CM | POA: Diagnosis not present

## 2020-10-07 HISTORY — PX: SCROTAL EXPLORATION: SHX2386

## 2020-10-07 LAB — POCT I-STAT, CHEM 8
BUN: 17 mg/dL (ref 8–23)
Calcium, Ion: 1.22 mmol/L (ref 1.15–1.40)
Chloride: 100 mmol/L (ref 98–111)
Creatinine, Ser: 1 mg/dL (ref 0.61–1.24)
Glucose, Bld: 124 mg/dL — ABNORMAL HIGH (ref 70–99)
HCT: 41 % (ref 39.0–52.0)
Hemoglobin: 13.9 g/dL (ref 13.0–17.0)
Potassium: 3.9 mmol/L (ref 3.5–5.1)
Sodium: 138 mmol/L (ref 135–145)
TCO2: 25 mmol/L (ref 22–32)

## 2020-10-07 LAB — GLUCOSE, CAPILLARY: Glucose-Capillary: 116 mg/dL — ABNORMAL HIGH (ref 70–99)

## 2020-10-07 SURGERY — EXPLORATION, SCROTUM
Anesthesia: General | Site: Scrotum | Laterality: Bilateral

## 2020-10-07 MED ORDER — FENTANYL CITRATE (PF) 100 MCG/2ML IJ SOLN
INTRAMUSCULAR | Status: AC
  Filled 2020-10-07: qty 2

## 2020-10-07 MED ORDER — LIDOCAINE HCL (PF) 2 % IJ SOLN
INTRAMUSCULAR | Status: AC
  Filled 2020-10-07: qty 5

## 2020-10-07 MED ORDER — LIDOCAINE HCL (CARDIAC) PF 100 MG/5ML IV SOSY
PREFILLED_SYRINGE | INTRAVENOUS | Status: DC | PRN
  Administered 2020-10-07: 100 mg via INTRAVENOUS

## 2020-10-07 MED ORDER — FENTANYL CITRATE (PF) 100 MCG/2ML IJ SOLN
INTRAMUSCULAR | Status: DC | PRN
  Administered 2020-10-07 (×2): 25 ug via INTRAVENOUS
  Administered 2020-10-07: 50 ug via INTRAVENOUS

## 2020-10-07 MED ORDER — SODIUM CHLORIDE 0.9% FLUSH: 3.0000 mL | Freq: Two times a day (BID) | INTRAVENOUS | Status: DC

## 2020-10-07 MED ORDER — ACETAMINOPHEN 500 MG PO TABS
1000.0000 mg | ORAL_TABLET | Freq: Once | ORAL | Status: AC
  Administered 2020-10-07: 1000 mg via ORAL

## 2020-10-07 MED ORDER — DEXAMETHASONE SODIUM PHOSPHATE 4 MG/ML IJ SOLN
INTRAMUSCULAR | Status: DC | PRN
  Administered 2020-10-07: 5 mg via INTRAVENOUS

## 2020-10-07 MED ORDER — ONDANSETRON HCL 4 MG/2ML IJ SOLN
INTRAMUSCULAR | Status: AC
  Filled 2020-10-07: qty 2

## 2020-10-07 MED ORDER — CEFAZOLIN SODIUM-DEXTROSE 2-4 GM/100ML-% IV SOLN
INTRAVENOUS | Status: AC
  Filled 2020-10-07: qty 100

## 2020-10-07 MED ORDER — EPHEDRINE SULFATE 50 MG/ML IJ SOLN
INTRAMUSCULAR | Status: DC | PRN
  Administered 2020-10-07 (×2): 10 mg via INTRAVENOUS

## 2020-10-07 MED ORDER — PROPOFOL 10 MG/ML IV BOLUS
INTRAVENOUS | Status: DC | PRN
  Administered 2020-10-07: 170 mg via INTRAVENOUS

## 2020-10-07 MED ORDER — ACETAMINOPHEN 500 MG PO TABS
ORAL_TABLET | ORAL | Status: AC
  Filled 2020-10-07: qty 2

## 2020-10-07 MED ORDER — DEXAMETHASONE SODIUM PHOSPHATE 10 MG/ML IJ SOLN
INTRAMUSCULAR | Status: AC
  Filled 2020-10-07: qty 1

## 2020-10-07 MED ORDER — ONDANSETRON HCL 4 MG/2ML IJ SOLN
INTRAMUSCULAR | Status: DC | PRN
  Administered 2020-10-07: 4 mg via INTRAVENOUS

## 2020-10-07 MED ORDER — CEFAZOLIN SODIUM-DEXTROSE 2-4 GM/100ML-% IV SOLN
2.0000 g | INTRAVENOUS | Status: AC
  Administered 2020-10-07: 2 g via INTRAVENOUS

## 2020-10-07 MED ORDER — HYDROCODONE-ACETAMINOPHEN 5-325 MG PO TABS: 1.0000 | ORAL_TABLET | Freq: Four times a day (QID) | ORAL | 0 refills | Status: AC | PRN

## 2020-10-07 MED ORDER — PHENYLEPHRINE HCL (PRESSORS) 10 MG/ML IV SOLN
INTRAVENOUS | Status: DC | PRN
  Administered 2020-10-07 (×2): 120 ug via INTRAVENOUS

## 2020-10-07 MED ORDER — SODIUM CHLORIDE 0.9 % IV SOLN: INTRAVENOUS | Status: DC

## 2020-10-07 SURGICAL SUPPLY — 54 items
APL SKNCLS STERI-STRIP NONHPOA (GAUZE/BANDAGES/DRESSINGS) ×1
APL SWBSTK 6 STRL LF DISP (MISCELLANEOUS)
APPLICATOR COTTON TIP 6 STRL (MISCELLANEOUS) IMPLANT
APPLICATOR COTTON TIP 6IN STRL (MISCELLANEOUS)
BENZOIN TINCTURE PRP APPL 2/3 (GAUZE/BANDAGES/DRESSINGS) ×2 IMPLANT
BLADE CLIPPER SENSICLIP SURGIC (BLADE) ×2 IMPLANT
BLADE SURG 15 STRL LF DISP TIS (BLADE) ×1 IMPLANT
BLADE SURG 15 STRL SS (BLADE) ×2
BNDG GAUZE ELAST 4 BULKY (GAUZE/BANDAGES/DRESSINGS) ×2 IMPLANT
CANISTER SUCT 1200ML W/VALVE (MISCELLANEOUS) ×2 IMPLANT
CLEANER CAUTERY TIP 5X5 PAD (MISCELLANEOUS) ×1 IMPLANT
CLOTH BEACON ORANGE TIMEOUT ST (SAFETY) ×2 IMPLANT
COVER BACK TABLE 60X90IN (DRAPES) ×2 IMPLANT
COVER MAYO STAND STRL (DRAPES) ×2 IMPLANT
COVER WAND RF STERILE (DRAPES) ×2 IMPLANT
DISSECTOR ROUND CHERRY 3/8 STR (MISCELLANEOUS) IMPLANT
DRAIN PENROSE 0.25X18 (DRAIN) IMPLANT
DRAPE LAPAROTOMY 100X72 PEDS (DRAPES) ×2 IMPLANT
DRSG TEGADERM 4X4.75 (GAUZE/BANDAGES/DRESSINGS) ×2 IMPLANT
ELECT REM PT RETURN 9FT ADLT (ELECTROSURGICAL) ×2
ELECTRODE REM PT RTRN 9FT ADLT (ELECTROSURGICAL) ×1 IMPLANT
GAUZE PACKING IODOFORM 1/2 (PACKING) ×4 IMPLANT
GAUZE SPONGE 4X4 12PLY STRL (GAUZE/BANDAGES/DRESSINGS) ×2 IMPLANT
GLOVE SURG POLYISO LF SZ8 (GLOVE) ×2 IMPLANT
GLOVE SURG UNDER POLY LF SZ6.5 (GLOVE) ×2 IMPLANT
GLOVE SURG UNDER POLY LF SZ7.5 (GLOVE) ×2 IMPLANT
GOWN STRL REUS W/TWL LRG LVL3 (GOWN DISPOSABLE) ×2 IMPLANT
KIT TURNOVER CYSTO (KITS) ×2 IMPLANT
MANIFOLD NEPTUNE II (INSTRUMENTS) IMPLANT
NEEDLE HYPO 22GX1.5 SAFETY (NEEDLE) IMPLANT
NS IRRIG 500ML POUR BTL (IV SOLUTION) IMPLANT
PACK BASIN DAY SURGERY FS (CUSTOM PROCEDURE TRAY) ×2 IMPLANT
PAD CLEANER CAUTERY TIP 5X5 (MISCELLANEOUS) ×1
PENCIL SMOKE EVACUATOR (MISCELLANEOUS) ×2 IMPLANT
STRIP CLOSURE SKIN 1/2X4 (GAUZE/BANDAGES/DRESSINGS) ×2 IMPLANT
SUPPORT SCROTAL LG STRP (MISCELLANEOUS) IMPLANT
SUT CHROMIC 3 0 SH 27 (SUTURE) IMPLANT
SUT CHROMIC GUT AB #0 18 (SUTURE) ×2 IMPLANT
SUT PROLENE 2 0 CT2 30 (SUTURE) ×2 IMPLANT
SUT SILK 3 0 TIES 17X18 (SUTURE)
SUT SILK 3-0 18XBRD TIE BLK (SUTURE) IMPLANT
SUT VIC AB 3-0 SH 27 (SUTURE) ×2
SUT VIC AB 3-0 SH 27X BRD (SUTURE) ×1 IMPLANT
SUT VIC AB 4-0 P-3 18XBRD (SUTURE) ×1 IMPLANT
SUT VIC AB 4-0 P3 18 (SUTURE) ×2
SUT VIC AB 4-0 PS2 18 (SUTURE) ×2 IMPLANT
SUT VICRYL 0 TIES 12 18 (SUTURE) ×2 IMPLANT
SYR BULB IRRIG 60ML STRL (SYRINGE) ×2 IMPLANT
SYR CONTROL 10ML LL (SYRINGE) IMPLANT
TOWEL OR 17X26 10 PK STRL BLUE (TOWEL DISPOSABLE) ×4 IMPLANT
TRAY DSU PREP LF (CUSTOM PROCEDURE TRAY) ×2 IMPLANT
TUBE CONNECTING 12X1/4 (SUCTIONS) ×2 IMPLANT
WATER STERILE IRR 500ML POUR (IV SOLUTION) IMPLANT
YANKAUER SUCT BULB TIP NO VENT (SUCTIONS) ×2 IMPLANT

## 2020-10-07 NOTE — Transfer of Care (Signed)
Immediate Anesthesia Transfer of Care Note  Patient: Tony [name]  Procedure(s) Performed: SCROTUM EXPLORATION, DRAINAGE OF HYDOCELE (Bilateral Scrotum)  Patient Location: PACU  Anesthesia Type:General  Level of Consciousness: awake, alert , oriented and patient cooperative  Airway & Oxygen Therapy: Patient Spontanous Breathing and Patient connected to nasal cannula oxygen  Post-op Assessment: Report given to RN and Post -op Vital signs reviewed and stable  Post vital signs: Reviewed and stable  Last Vitals:  Vitals Value Taken Time  BP    Temp    Pulse 61 10/07/20 1053  Resp 18 10/07/20 1053  SpO2 100 % 10/07/20 1053  Vitals shown include unvalidated device data.  Last Pain:  Vitals:   10/07/20 0832  TempSrc: Oral         Complications: No complications documented.

## 2020-10-07 NOTE — Discharge Instructions (Addendum)
Post Anesthesia Home Care Instructions  Activity: Get plenty of rest for the remainder of the day. A responsible adult should stay with you for 24 hours following the procedure.  For the next 24 hours, DO NOT: -Drive a car -Advertising copywriter -Drink alcoholic beverages -Take any medication unless instructed by your physician -Make any legal decisions or sign important papers.  Meals: Start with liquid foods such as gelatin or soup. Progress to regular foods as tolerated. Avoid greasy, spicy, heavy foods. If nausea and/or vomiting occur, drink only clear liquids until the nausea and/or vomiting subsides. Call your physician if vomiting continues.  Special Instructions/Symptoms: Your throat may feel dry or sore from the anesthesia or the breathing tube placed in your throat during surgery. If this causes discomfort, gargle with warm salt water. The discomfort should disappear within 24 hours.  If you had a scopolamine patch placed behind your ear for the management of post- operative nausea and/or vomiting:  1. The medication in the patch is effective for 72 hours, after which it should be removed.  Wrap patch in a tissue and discard in the trash. Wash hands thoroughly with soap and water. 2. You may remove the patch earlier than 72 hours if you experience unpleasant side effects which may include dry mouth, dizziness or visual disturbances. 3. Avoid touching the patch. Wash your hands with soap and water after contact with the patch.   Current Urology, 10(1), 1-14. https://doi.[email or web address]/[email or web address]">  Hydrocelectomy, Adult, Care After This sheet gives you information about how to care for yourself after your procedure. Your health care provider may also give you more specific instructions. If you have problems or questions, contact your health care provider. What can I expect after the procedure? After your procedure, it is common to have:  Mild discomfort and swelling in the pouch that  holds your testicles (scrotum).  Bruising of the scrotum. Follow these instructions at home: Medicines  Take over-the-counter and prescription medicines only as told by your health care provider.  Ask your health care provider if the medicine prescribed to you: ? Requires you to avoid driving or using heavy machinery. ? Can cause constipation. You may need to take these actions to prevent or treat constipation:  Drink enough fluid to keep your urine pale yellow.  Take over-the-counter or prescription medicines.  Eat foods that are high in fiber, such as beans, whole grains, and fresh fruits and vegetables.  Limit foods that are high in fat and processed sugars, such as fried or sweet foods. Bathing  Do not take baths, swim, or use a hot tub until your health care provider approves. Ask your health care provider if you may take showers. You may only be allowed to take sponge baths.  If you were told to wear an athletic support strap (scrotal support), keep it dry. Take it off when you shower or bathe. Incision care  Follow instructions from your health care provider about how to take care of your incision. Make sure you: ? Wash your hands with soap and water before and after you change your bandage (dressing). If soap and water are not available, use hand sanitizer. ? Change your dressing as told by your health care provider. ? Leave stitches (sutures), skin glue, or adhesive strips in place. These skin closures may need to stay in place for 2 weeks or longer. If adhesive strip edges start to loosen and curl up, you may trim the loose edges. Do not remove adhesive strips completely unless  your health care provider tells you to do that.  Check your incision and scrotum every day for signs of infection. Check for: ? More redness, swelling, or pain. ? Fluid or blood. ? Warmth. ? Pus or a bad smell.   Managing pain and swelling If directed, put ice on the affected area. To do  this:  Put ice in a plastic bag.  Place a towel between your skin and the bag.  Leave the ice on for 20 minutes, 2-3 times per day.   Activity  Do not do any high-energy activities for as long as told by your health care provider.  Do not lift anything that is heavier than 10 lb (4.5 kg), or the limit that you are told, until your health care provider says that it is safe.  Return to your normal activities as told by your health care provider. Ask your health care provider what activities are safe for you.  Do not drive for 24 hours if you were given a sedative during your procedure.  Ask your health care provider when it is safe to drive. General instructions  Do not use any products that contain nicotine or tobacco, such as cigarettes, e-cigarettes, and chewing tobacco. These can delay incision healing after surgery. If you need help quitting, ask your health care provider.  If you were given a scrotal support, wear it as told by your health care provider.  If you had a drain put in during the procedure, you will need to have it removed at a follow-up visit.  Keep all follow-up visits as told by your health care provider. This is important. Contact a health care provider if:  Your pain is not controlled with medicine.  You have more redness, swelling, or pain around your scrotum.  You have fluid or blood coming from your incision.  Your incision feels warm to the touch.  You have pus or a bad smell coming from your scrotum.  You have a fever. Get help right away if:  You develop shaking, chills, and a fever that is higher than 101.44F (38.8C).  You have redness or swelling that starts at your scrotum and spreads outward to cover your whole groin.  You develop swelling of the legs or difficulty breathing. Summary  After a hydrocelectomy, it is common to have mild discomfort, swelling, and bruising.  Do not take baths, swim, or use a hot tub until your health care  provider approves. Ask your health care provider if you may take showers.  If directed, put ice on the affected area to help with pain and swelling.  Do not do any high-energy activities or lift anything heavier than 10 lb (4.5 kg) for as long as told by your health care provider.  If you were given a scrotal support, keep it dry. Wear the scrotal support as told by your health care provider.  Because of the nature of the fluid collections in the scrotum, I felt that gauze packing would be the most appropriate drainage option.  The packings are secured to the skin with suture and I would like to have you return to the office early next week to begin removal.   We will need to gradually remove the packings over several days and we will instruct you how to proceed with that at follow up next week.  Please keep a dressing on the wound has you have been doing.      This information is not intended to replace advice  given to you by your health care provider. Make sure you discuss any questions you have with your health care provider. Document Revised: 01/06/2019 Document Reviewed: 01/06/2019 Elsevier Patient Education  2021 [employer].

## 2020-10-07 NOTE — Anesthesia Postprocedure Evaluation (Signed)
Anesthesia Post Note  Patient: Tony [name]  Procedure(s) Performed: SCROTUM EXPLORATION, DRAINAGE OF HYDOCELE (Bilateral Scrotum)     Patient location during evaluation: PACU Anesthesia Type: General Level of consciousness: awake and alert and oriented Pain management: pain level controlled Vital Signs Assessment: post-procedure vital signs reviewed and stable Respiratory status: spontaneous breathing, nonlabored ventilation and respiratory function stable Cardiovascular status: blood pressure returned to baseline Postop Assessment: no apparent nausea or vomiting Anesthetic complications: no   No complications documented.  Last Vitals:  Vitals:   10/07/20 1125 10/07/20 1128  BP:    Pulse: 73 73  Resp: 13 17  Temp:  (!) 36.3 C  SpO2: 100% 99%    Last Pain:  Vitals:   10/07/20 1128  TempSrc: Oral                 Kaylyn Layer

## 2020-10-07 NOTE — Anesthesia Procedure Notes (Signed)
Procedure Name: LMA Insertion Date/Time: 10/07/2020 10:14 AM Performed by: Earmon Phoenix, CRNA Pre-anesthesia Checklist: Emergency Drugs available, Suction available, Patient identified, Patient being monitored and Timeout performed Patient Re-evaluated:Patient Re-evaluated prior to induction Oxygen Delivery Method: Circle system utilized Preoxygenation: Pre-oxygenation with 100% oxygen Induction Type: IV induction LMA: LMA inserted LMA Size: 4.0 Number of attempts: 1 Placement Confirmation: positive ETCO2,  CO2 detector and breath sounds checked- equal and bilateral Tube secured with: Tape Dental Injury: Teeth and Oropharynx as per pre-operative assessment

## 2020-10-07 NOTE — Interval H&P Note (Signed)
History and Physical Interval Note:  Mr. Tony [name] has had recurrent swelling with a scrotal hematoma following his initial hydrocelectomy.   He has not improved significantly despite office drainage of the left hematoma.  On [place] he appears to have recurrent right hydrocele with which is loculated and a probable resolving hematoma on the left.   I will explore both sides and definitely drain the right but I made him aware that the left may not be drainable depending on the degree of hematoma fibrosis/organization.    10/07/2020 9:45 AM  Tony [name]  has presented today for surgery, with the diagnosis of scrotal swelling recurrent hydrocele.  The various methods of treatment have been discussed with the patient and family. After consideration of risks, benefits and other options for treatment, the patient has consented to  Procedure(s): SCROTUM EXPLORATION (Bilateral) as a surgical intervention.  The patient's history has been reviewed, patient examined, no change in status, stable for surgery.  I have reviewed the patient's chart and labs.  Questions were answered to the patient's satisfaction.     Bjorn Pippin

## 2020-10-07 NOTE — Op Note (Signed)
Procedure: Bilateral scrotal exploration with drainage of recurrent right hydrocele.  Preop diagnosis: Recurrent right hydrocele and persistent left scrotal swelling.  Postop diagnosis: Same.  Surgeon: Dr. Bjorn Pippin.  Anesthesia: General.  Specimen: None.  Drain: Iodoform gauze packing.  EBL: 2 mL.  Complications: None.  Indications: The patient is a 80 year old male who had undergone bilateral hydrocelectomy for chronic hydroceles in mid January. Postoperatively he had recurrent swelling with pain and was seen in the office where he underwent evacuation of the left scrotal hematoma. He is returned on multiple occasions with persistent drainage and a recent scrotal ultrasound demonstrated a large fluid collection inferior to the right testicle. There was some heterogenous material adjacent to the left testicle which was more consistent with an organized hematoma and a fluid collection. He has had some persistent drainage of the purulent nature from his mid scrotal incision. It was felt that exploration was indicated to try to expedite his recovery.  Procedure: He was taken to the operating room where he was given 2 g of Ancef. A general anesthetic was induced. He was placed in the supine position his scrotum was clipped. He was prepped with Betadine solution and draped in the usual sterile fashion.  On inspection there was an anterior mid scrotal wound approximately 3 cm in length with a good granulation bed but a sinus in the anterior portion with purulent drainage. A finger was used to probe this area and open up the prior hematoma cavity which had some mild purulent collection. The cavity was probed sufficient to break down all loculations. The testicle on that side was enlarged but more indurated and consistent with a more usual post hydrocelectomy finding and I did not feel further exploration of that side would be of value as this process should resolve spontaneously.  I then used the  Bovie to incise through the dartos fascia into the fluid collection below the right testicle. There was an epithelial lined sac that contained 200 mg of clear amber fluid but nothing to suggest infection. The sac was probed to break down loculations and then a 4 x 4 sponge with a hemostat was used to roughen up the epithelium to aid subsequent resolution.  A good length of half inch iodoform gauze was then packed into the right hemiscrotum and 2 additional shorter lengths were packed into the pockets and the left hemiscrotum. Each of these pieces of iodoform gauze were secured to the skin with a 3-0 chromic suture in the skin between the packings were loosely reapproximated with a 3-0 chromic suture to facilitate wound healing.  A dressing of 4 x 4's, fluff Kerlix and a scrotal support was then applied. His anesthetic was reversed and he was moved to recovery room in stable condition. There were no complications.

## 2020-10-10 ENCOUNTER — Encounter (HOSPITAL_BASED_OUTPATIENT_CLINIC_OR_DEPARTMENT_OTHER): Payer: Self-pay | Admitting: Urology

## 2020-11-10 DIAGNOSIS — Z299 Encounter for prophylactic measures, unspecified: Secondary | ICD-10-CM | POA: Diagnosis not present

## 2020-11-10 DIAGNOSIS — E1165 Type 2 diabetes mellitus with hyperglycemia: Secondary | ICD-10-CM | POA: Diagnosis not present

## 2020-11-10 DIAGNOSIS — I10 Essential (primary) hypertension: Secondary | ICD-10-CM | POA: Diagnosis not present

## 2020-11-10 DIAGNOSIS — N183 Chronic kidney disease, stage 3 unspecified: Secondary | ICD-10-CM | POA: Diagnosis not present

## 2020-11-10 DIAGNOSIS — E1122 Type 2 diabetes mellitus with diabetic chronic kidney disease: Secondary | ICD-10-CM | POA: Diagnosis not present

## 2020-11-10 DIAGNOSIS — I714 Abdominal aortic aneurysm, without rupture: Secondary | ICD-10-CM | POA: Diagnosis not present

## 2020-11-21 ENCOUNTER — Other Ambulatory Visit (HOSPITAL_COMMUNITY): Payer: Self-pay | Admitting: Cardiology

## 2020-11-21 DIAGNOSIS — I6523 Occlusion and stenosis of bilateral carotid arteries: Secondary | ICD-10-CM

## 2020-12-13 ENCOUNTER — Telehealth: Payer: Self-pay

## 2020-12-13 NOTE — Telephone Encounter (Signed)
Pt called with c/o ongoing pain in LLE that "burns" when he walks and a "red spot" off to the side of L calf. This has all been going on since last year when he saw MD. It has remained unchanged. He has been scheduled to see MD next month. Pt verbalized understanding and will call us back if anything changes.

## 2020-12-26 ENCOUNTER — Other Ambulatory Visit: Payer: Self-pay

## 2020-12-26 DIAGNOSIS — I739 Peripheral vascular disease, unspecified: Secondary | ICD-10-CM

## 2021-01-04 ENCOUNTER — Other Ambulatory Visit: Payer: Self-pay

## 2021-01-04 ENCOUNTER — Encounter: Payer: Self-pay | Admitting: Vascular Surgery

## 2021-01-04 ENCOUNTER — Ambulatory Visit: Payer: Medicare HMO | Admitting: Vascular Surgery

## 2021-01-04 ENCOUNTER — Ambulatory Visit (HOSPITAL_COMMUNITY)
Admission: RE | Admit: 2021-01-04 | Discharge: 2021-01-04 | Disposition: A | Discharge status: Home / Self Care | Payer: Medicare HMO | Source: Ambulatory Visit | Attending: Vascular Surgery | Admitting: Vascular Surgery

## 2021-01-04 VITALS — BP 152/96 | HR 71 | Temp 98.4°F | Resp 20 | Ht 71.0 in | Wt 218.0 lb

## 2021-01-04 DIAGNOSIS — I739 Peripheral vascular disease, unspecified: Secondary | ICD-10-CM

## 2021-01-04 DIAGNOSIS — I714 Abdominal aortic aneurysm, without rupture, unspecified: Secondary | ICD-10-CM

## 2021-01-04 NOTE — Progress Notes (Signed)
REASON FOR VISIT:   Follow-up after open repair of abdominal aortic aneurysm.  MEDICAL ISSUES:   S/P OPEN REPAIR OF ABDOMINAL AORTIC ANEURYSM: The patient is undergone open repair of his abdominal aortic aneurysm in June 2021.  He is doing well from that standpoint.  He has no evidence of ventral hernia.  His incision is healing nicely.  PERIPHERAL VASCULAR DISEASE: He has infrainguinal arterial occlusive disease bilaterally.  His symptoms are more significant on the left side where he has more significant disease.  However he has no rest pain or nonhealing ulcers.  I encouraged him to walk as much as possible.  He is not a smoker.  I plan on seeing him back in 2 years.  I have not ordered formal ABIs as he prefers to have these done closer to home.  VASCULAR QUALITY INITIATIVE: He does not tolerate aspirin as he gets a gout flareup whenever he takes aspirin. In addition he does not tolerate statins but does get Praluent injection every 2 weeks.  HPI:   Tony [name] is a pleasant 80 y.o. male who I last saw on 06/01/2020.  He had presented with an enlarging abdominal aortic aneurysm.  The aneurysm was 5.6 cm in maximum diameter.  He was not a candidate for an endovascular repair.  On 02/09/2020 he underwent aortic endarterectomy and repair of the abdominal aortic aneurysm with an aortobiiliac bypass graft.  When I saw him last the patient was doing well.  He had palpable femoral pulses.  He did have evidence of infrainguinal arterial occlusive disease bilaterally.  He comes in for a routine follow-up visit to follow his peripheral vascular disease.  Since I saw him last he has had quite an issue with a hydrocele which is finally healing.  He does have stable claudication bilaterally which is more significant on the left side.  He has calf claudication which is brought on by ambulation and relieved with rest.  He can walk about 200 feet on a flat surface without experiencing symptoms.  He  denies any history of rest pain or nonhealing ulcers.   Past Medical History:  Diagnosis Date  . Aortic valve stenosis, mild    last echo in epic 03-22-2020, valve area 2.01 cm^2  . Arthritis   . Bilateral hydrocele   . CKD (chronic kidney disease), stage III (HCC)   . Claudication of left lower extremity (HCC)    per pt when walks, go up stairs  . Coronary artery disease cardiologist--- dr hochrein   s/p  severe 3V CAD,  CABG x3 02-09-2013; s/p AAA and bi-iliac bypass graft 02-09-2020  . Heart murmur    as a child  . History of gout    09-07-2020  per pt has been several yrs, effects right great toe and trigger by asa  . Hyperlipidemia   . Hypertension   . Occlusion and stenosis of bilateral carotid arteries    per last duplex in epic 05-05-2020 right ICA 40-59%, total occlusion left ICA  . PAD (peripheral artery disease) (HCC)    known bilateral infrainguinal artial occlusive disease  . PVD (peripheral vascular disease) (HCC)    vascular --- dr Edilia Bo---  s/p  AAA repair and aorta bi-iliac bypass graft  . S/P AAA (abdominal aortic aneurysm) repair 02/09/2020  . S/P CABG x 3 02/09/2013   LIMA - LAD,  SVG -- RCA,  seqSVG -- OM1 and distal CFx  . Type 2 diabetes mellitus (HCC)  followed by pcp---- (09-07-2020 per pt checks blood sugar in am dialy,  fasting sugar--- 118--130)  . Wears glasses     Family History  Problem Relation Age of Onset  . Heart failure Mother 60  . Cancer Sister     SOCIAL HISTORY: Social History   Tobacco Use  . Smoking status: Former Smoker    Packs/day: 3.00    Years: 40.00    Pack years: 120.00    Types: Cigarettes    Quit date: 05/27/1992    Years since quitting: 28.6  . Smokeless tobacco: Never Used  Substance Use Topics  . Alcohol use: Not Currently    Comment: QUIT 1993    Allergies  Allergen Reactions  . Aspirin Other (See Comments)    Causes gout flare  . Statins Other (See Comments)    Pt states severe muscle pains.     Current Outpatient Medications  Medication Sig Dispense Refill  . benazepril (LOTENSIN) 20 MG tablet Take 20 mg by mouth every evening.    . diclofenac (VOLTAREN) 75 MG EC tablet Take 75 mg by mouth 2 (two) times daily as needed.    . diclofenac Sodium (VOLTAREN) 1 % GEL Apply 2 g topically daily as needed.    . fenofibrate 160 MG tablet Take 160 mg by mouth daily.    Marland Kitchen glipiZIDE (GLUCOTROL) 5 MG tablet Take 5 mg by mouth 2 (two) times daily.    . Alirocumab (PRALUENT) 150 MG/ML SOAJ Inject 150 mg into the skin every 14 (fourteen) days. (Patient not taking: Reported on 01/04/2021) 2 pen 11  . doxycycline (VIBRAMYCIN) 100 MG capsule Take 100 mg by mouth 2 (two) times daily. (Patient not taking: Reported on 01/04/2021)    . HYDROcodone-acetaminophen (NORCO/VICODIN) 5-325 MG tablet Take 1 tablet by mouth every 6 (six) hours as needed for moderate pain. (Patient not taking: Reported on 01/04/2021) 12 tablet 0   No current facility-administered medications for this visit.    REVIEW OF SYSTEMS:  [X]  denotes positive finding, [ ]  denotes negative finding Cardiac  Comments:  Chest pain or chest pressure:    Shortness of breath upon exertion:    Short of breath when lying flat:    Irregular heart rhythm:        Vascular    Pain in calf, thigh, or hip brought on by ambulation:    Pain in feet at night that wakes you up from your sleep:     Blood clot in your veins:    Leg swelling:         Pulmonary    Oxygen at home:    Productive cough:     Wheezing:         Neurologic    Sudden weakness in arms or legs:     Sudden numbness in arms or legs:     Sudden onset of difficulty speaking or slurred speech:    Temporary loss of vision in one eye:     Problems with dizziness:         Gastrointestinal    Blood in stool:     Vomited blood:         Genitourinary    Burning when urinating:     Blood in urine:        Psychiatric    Major depression:         Hematologic    Bleeding  problems:    Problems with blood clotting too easily:  Skin    Rashes or ulcers:        Constitutional    Fever or chills:     PHYSICAL EXAM:   Vitals:   01/04/21 0947  BP: (!) 152/96  Pulse: 71  Resp: 20  Temp: 98.4 F (36.9 C)  SpO2: 97%  Weight: 218 lb (98.9 kg)  Height: 5\' 11"  (1.803 m)    GENERAL: The patient is a well-nourished male, in no acute distress. The vital signs are documented above. CARDIAC: There is a regular rate and rhythm.  He has a systolic murmur. VASCULAR: I do not detect carotid bruits. He has palpable femoral pulses.  I cannot palpate pedal pulses. PULMONARY: There is good air exchange bilaterally without wheezing or rales. ABDOMEN: Soft and non-tender with normal pitched bowel sounds.  MUSCULOSKELETAL: There are no major deformities or cyanosis. NEUROLOGIC: No focal weakness or paresthesias are detected. SKIN: There are no ulcers or rashes noted. PSYCHIATRIC: The patient has a normal affect.  DATA:    ARTERIAL DOPPLER STUDY: I have independently interpreted his arterial Doppler study today.  On the right side there is a monophasic dorsalis pedis and posterior tibial signal.  ABI is 66%.  Toe pressure is 93 mmHg.  On the left side there is a monophasic anterior tibial signal.  There is no posterior tibial signal.  ABIs 39%.  Toe pressure 62 mmHg.  [facility] [phone]

## 2021-01-06 IMAGING — DX DG ABD PORTABLE 1V
2 series · 2 of 2 positions shown · non-contrast
Comparison: CT abdomen and pelvis January 10, 2018

CLINICAL DATA: Status post abdominal aortic aneurysm repair.

EXAM:
PORTABLE ABDOMEN - 1 VIEW

[abdomen kub (1 of 2)]
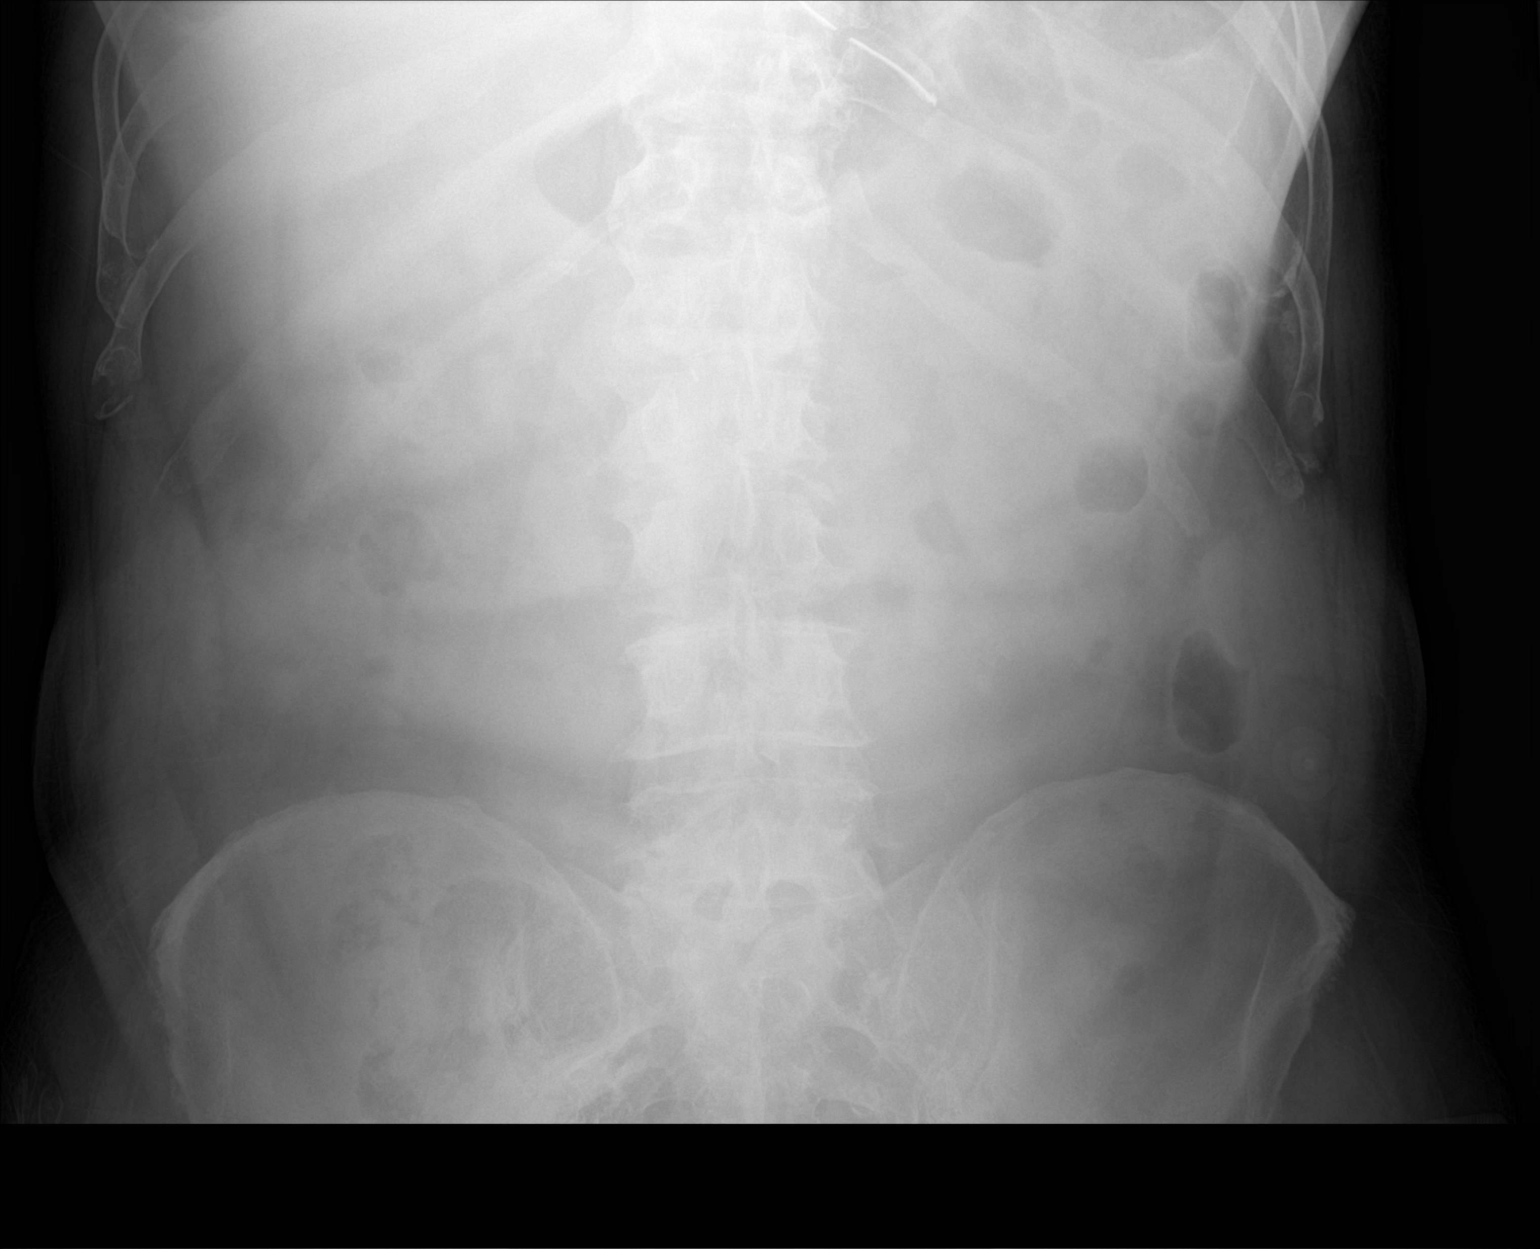

[abdomen kub (2 of 2)]
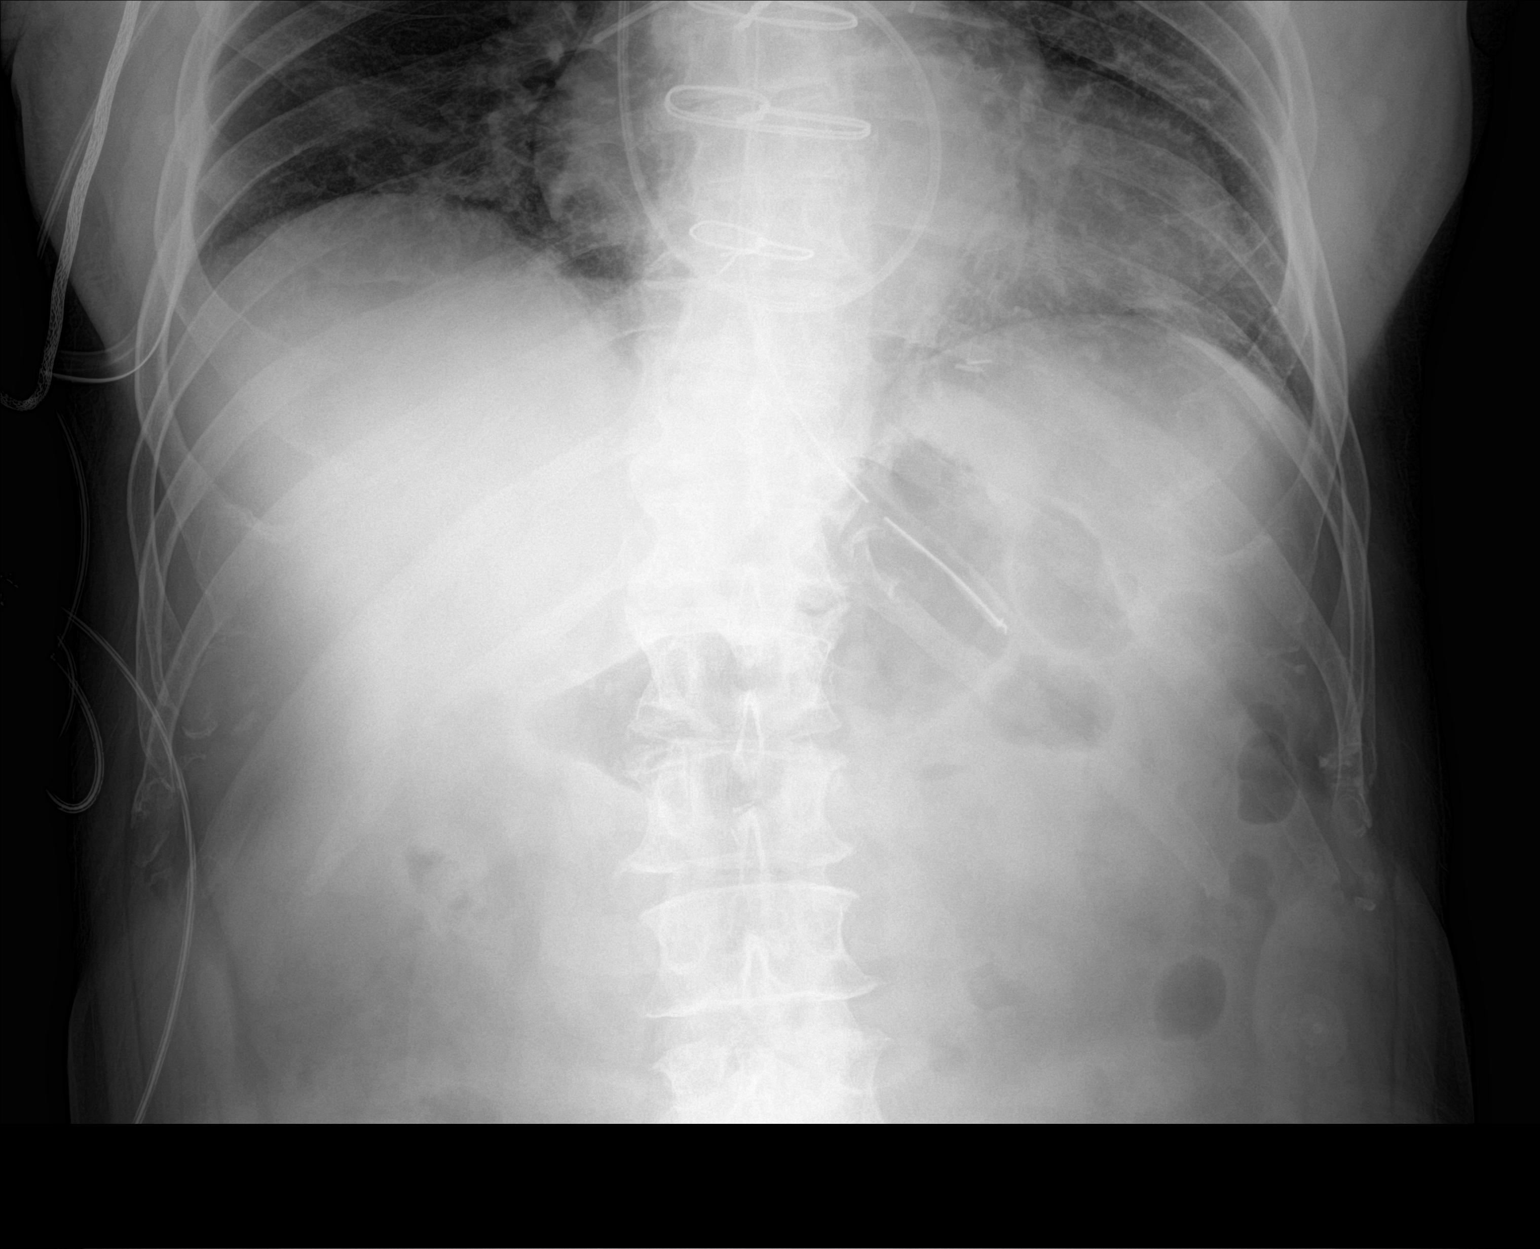

[2 of 2 positions shown; findings below may reference images not displayed]

FINDINGS: Nasogastric tube tip and side port are in the body of the stomach.
There is no bowel dilatation or air-fluid level to suggest bowel
obstruction. No free air appreciable.
IMPRESSION: Nasogastric tube tip and side port in body of stomach. No bowel
obstruction or free air demonstrable.

## 2021-01-06 IMAGING — DX DG CHEST 1V PORT
1 series · 1 of 1 positions shown · non-contrast
Comparison: March 09, 2013

CLINICAL DATA: Status post abdominal aortic aneurysm repair.

EXAM:
PORTABLE CHEST 1 VIEW

[chest ap]
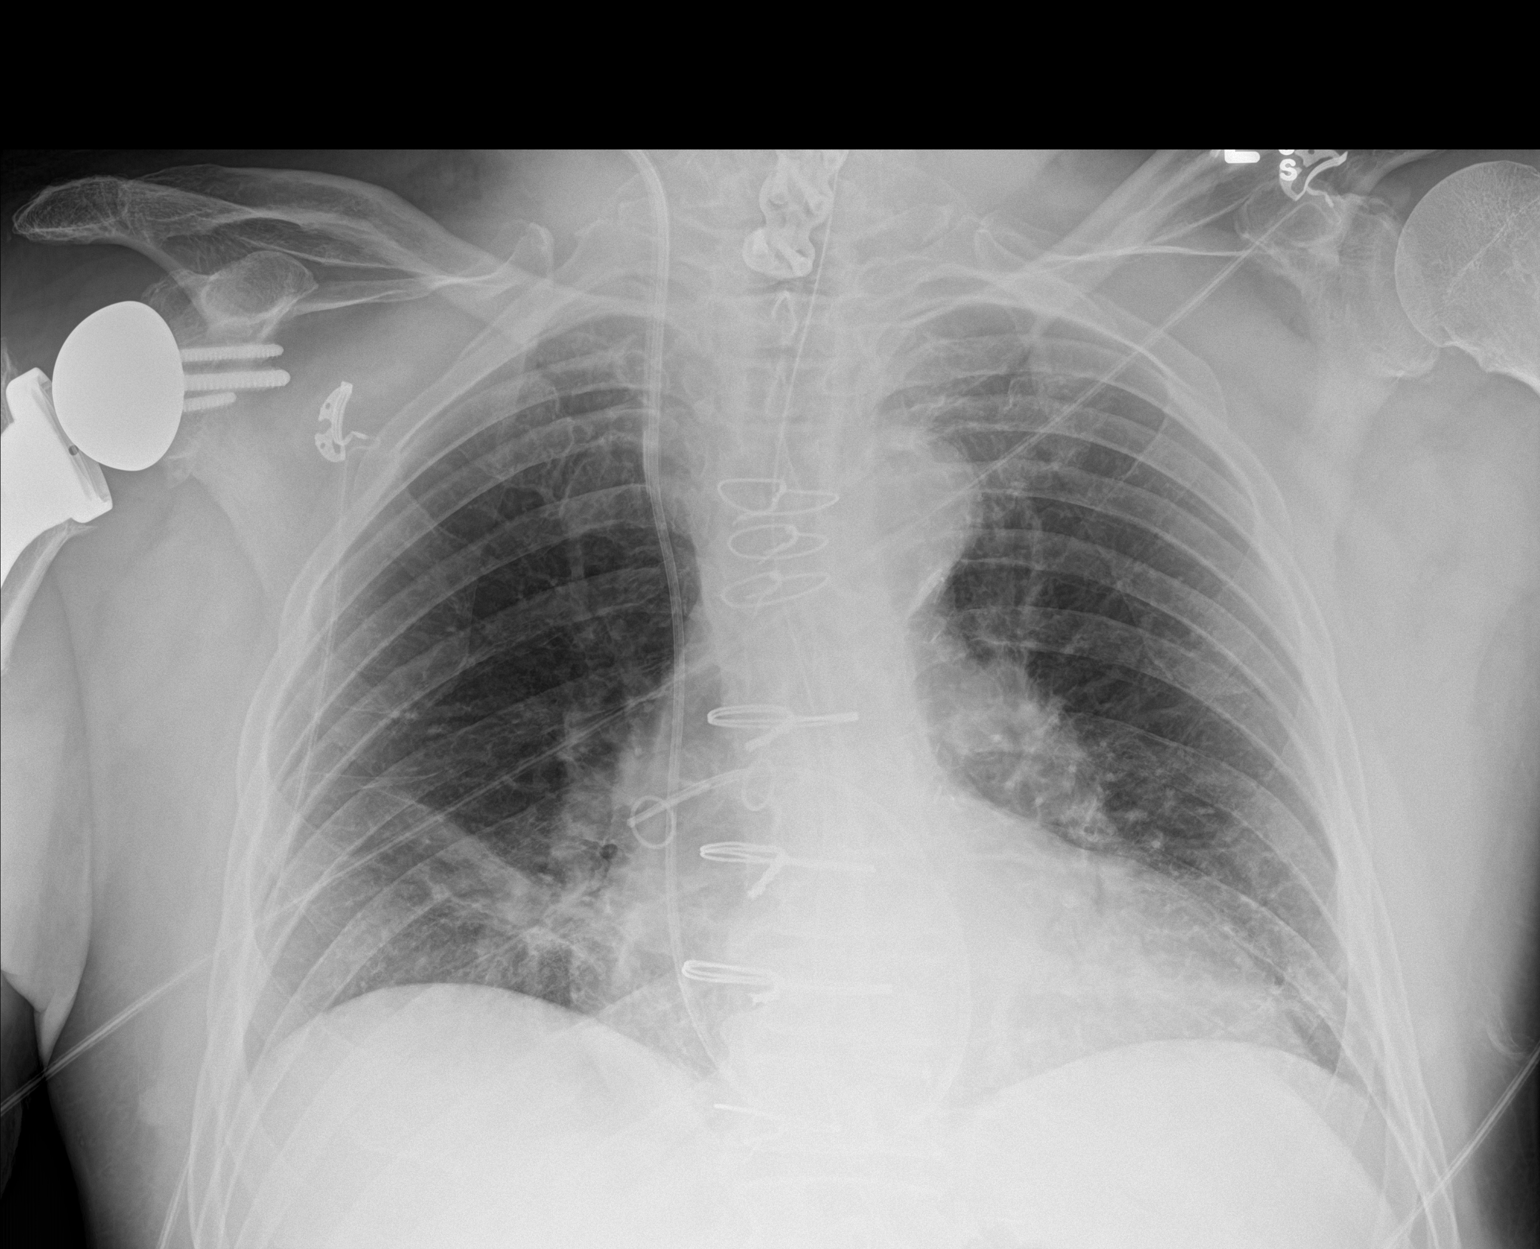

[1 of 1 positions shown; findings below may reference images not displayed]

FINDINGS: There is airspace opacity in the right base. Lungs elsewhere are
clear. Heart is borderline enlarged with pulmonary vascularity
normal. There is aortic atherosclerosis. Status post coronary artery
bypass grafting.

Swan-Ganz catheter tip is in the distal right main pulmonary artery.
Nasogastric tube tip and side port are below the diaphragm.

Patient is status post right total shoulder replacement. There is
postoperative change in the lower cervical spine.
IMPRESSION: Atelectasis with questionable superimposed pneumonia or aspiration
in the right base. Lungs otherwise clear.

Heart is borderline enlarged with postoperative coronary artery
bypass grafting. Aortic Atherosclerosis (12J1C-5V8.8).

Tube and catheter positions as described without evident
pneumothorax.

## 2021-01-23 DIAGNOSIS — E119 Type 2 diabetes mellitus without complications: Secondary | ICD-10-CM | POA: Diagnosis not present

## 2021-01-23 DIAGNOSIS — I10 Essential (primary) hypertension: Secondary | ICD-10-CM | POA: Diagnosis not present

## 2021-01-24 DIAGNOSIS — Z0100 Encounter for examination of eyes and vision without abnormal findings: Secondary | ICD-10-CM | POA: Diagnosis not present

## 2021-01-31 DIAGNOSIS — I350 Nonrheumatic aortic (valve) stenosis: Secondary | ICD-10-CM | POA: Insufficient documentation

## 2021-01-31 NOTE — Progress Notes (Addendum)
Cardiology Office Note   Date:  02/01/2021   ID:  Tony [name], DOB Jan 08, 1941, MRN [id number]  PCP:  Ignatius Specking, MD  Cardiologist:   [name], MD   Chief Complaint  Patient presents with  . Coronary Artery Disease      History of Present Illness: Tony [name] is a 80 y.o. male who presents for followup after CABG in June of 2014.   Since I last saw him he had bilateral hydrocelectomy by Dr. Annabell Howells.   He had open repair of an AAA.  He did well with both of his surgeries finally. The patient denies any new symptoms such as chest discomfort, neck or arm discomfort. There has been no new shortness of breath, PND or orthopnea. There have been no reported palpitations, presyncope or syncope.  He does have some stable claudication.  He has some ankle pain related to previous injury.  Unfortunately he has not been able to afford the Praluent.  Though he apparently has a scholarship they are charging a "$50 handling feet.".  He has been started on fenofibrate for hypertriglyceridemia.  He has labs pending.   Past Medical History:  Diagnosis Date  . Aortic valve stenosis, mild    last echo in epic 03-22-2020, valve area 2.01 cm^2  . Arthritis   . Bilateral hydrocele   . CKD (chronic kidney disease), stage III (HCC)   . Claudication of left lower extremity (HCC)    per pt when walks, go up stairs  . Coronary artery disease cardiologist--- dr Kunaal Walkins   s/p  severe 3V CAD,  CABG x3 02-09-2013; s/p AAA and bi-iliac bypass graft 02-09-2020  . Heart murmur    as a child  . History of gout    09-07-2020  per pt has been several yrs, effects right great toe and trigger by asa  . Hyperlipidemia   . Hypertension   . Occlusion and stenosis of bilateral carotid arteries    per last duplex in epic 05-05-2020 right ICA 40-59%, total occlusion left ICA  . PAD (peripheral artery disease) (HCC)    known bilateral infrainguinal artial occlusive disease  . PVD (peripheral vascular  disease) (HCC)    vascular --- dr Edilia Bo---  s/p  AAA repair and aorta bi-iliac bypass graft  . S/P AAA (abdominal aortic aneurysm) repair 02/09/2020  . S/P CABG x 3 02/09/2013   LIMA - LAD,  SVG -- RCA,  seqSVG -- OM1 and distal CFx  . Type 2 diabetes mellitus (HCC)    followed by pcp---- (09-07-2020 per pt checks blood sugar in am dialy,  fasting sugar--- 118--130)  . Wears glasses     Past Surgical History:  Procedure Laterality Date  . ABDOMINAL AORTIC ANEURYSM REPAIR N/A 02/09/2020   Procedure: ABDOMINAL AORTIC ANEURYSM REPAIR;  Surgeon: Chuck Hint, MD;  Location: [facility] OR;  Service: Vascular;  Laterality: N/A;  . ANTERIOR CERVICAL DECOMP/DISCECTOMY FUSION  12-13-2008  @ [facility]   C5--6  . AORTA - BILATERAL FEMORAL ARTERY BYPASS GRAFT Bilateral 02/09/2020   Procedure: AORTA BI-ILIAC  BYPASS GRAFT;  Surgeon: Chuck Hint, MD;  Location: [facility] OR;  Service: Vascular;  Laterality: Bilateral;  . AORTIC ENDARTERECETOMY N/A 02/09/2020   Procedure: AORTIC ENDARTERECETOMY;  Surgeon: Chuck Hint, MD;  Location: [facility] OR;  Service: Vascular;  Laterality: N/A;  . CARDIAC CATHETERIZATION  02-05-2013  dr Jena Tegeler   severe 3V CAD, preserved EF  . CARDIAC CATHETERIZATION  per pt 1993  had one blockage with no intervention per cardiology note  . CORONARY ARTERY BYPASS GRAFT N/A 02/09/2013   Procedure: CORONARY ARTERY BYPASS GRAFTING (CABG);  Surgeon: Kerin Perna, MD;  Location: [facility] OR;  Service: Open Heart Surgery;  Laterality: N/A;  LIMA to the LAD, SVG to right coronary artery, sequential SVG to OM and distal circumflex.  Marland Kitchen HYDROCELE EXCISION Bilateral 09/09/2020   Procedure: BILATERAL HYDROCELECTOMY ADULT;  Surgeon: Bjorn Pippin, MD;  Location: [facility];  Service: Urology;  Laterality: Bilateral;  . INTRAOPERATIVE TRANSESOPHAGEAL ECHOCARDIOGRAM N/A 02/09/2013   Procedure: INTRAOPERATIVE TRANSESOPHAGEAL ECHOCARDIOGRAM;  Surgeon: Kerin Perna, MD;  Location:  [facility] OR;  Service: Open Heart Surgery;  Laterality: N/A;  . LUMBAR FUSION  08-04-2007  @[facility]    L3 -- L5  . REVERSE SHOULDER ARTHROPLASTY Right 06/25/2018   Procedure: RIGHT REVERSE SHOULDER ARTHROPLASTY;  Surgeon: 06/27/2018, MD;  Location: [facility] OR;  Service: Orthopedics;  Laterality: Right;  . SCROTAL EXPLORATION Bilateral 10/07/2020   Procedure: SCROTUM EXPLORATION, DRAINAGE OF HYDOCELE;  Surgeon: 12/05/2020, MD;  Location: [facility];  Service: Urology;  Laterality: Bilateral;   Prior to Admission medications   Medication Sig Start Date End Date Taking? Authorizing Provider  benazepril (LOTENSIN) 20 MG tablet Take 20 mg by mouth every evening. 05/30/20  Yes [provider]  diclofenac Sodium (VOLTAREN) 1 % GEL Apply 2 g topically daily as needed.   Yes [provider]  fenofibrate 160 MG tablet Take 160 mg by mouth daily. 01/03/21  Yes [provider]  glipiZIDE (GLUCOTROL) 5 MG tablet Take 5 mg by mouth 2 (two) times daily.   Yes [provider]  Alirocumab (PRALUENT) 150 MG/ML SOAJ Inject 150 mg into the skin every 14 (fourteen) days. Patient not taking: No sig reported 10/12/19   10/14/19, MD  HYDROcodone-acetaminophen (NORCO/VICODIN) 5-325 MG tablet Take 1 tablet by mouth every 6 (six) hours as needed for moderate pain. Patient not taking: No sig reported 10/07/20 10/07/21  12/05/21, MD     Allergies:   Aspirin and Statins    ROS:  Please see the history of present illness.   Otherwise, review of systeM are positive for none.   All other systems are reviewed and negative.    PHYSICAL EXAM: VS:  BP (!) 142/90   Pulse 64   Ht 5\' 11"  (1.803 m)   Wt 216 lb (98 kg)   BMI 30.13 kg/m  , BMI Body mass index is 30.13 kg/m. GENERAL:  Well appearing NECK:  No jugular venous distention, waveform within normal limits, carotid upstroke brisk and symmetric, no bruits, no thyromegaly LUNGS:  Clear to auscultation bilaterally CHEST:   Well healed sternotomy scar. HEART:  PMI not displaced or sustained,S1 and S2 within normal limits, no S3, no S4, no clicks, no rubs, 3 out of 6 systolic murmur radiating slightly at the aortic outflow tract and early peaking, no diastolic murmurs ABD:  Flat, positive bowel sounds normal in frequency in pitch, no bruits, no rebound, no guarding, no midline pulsatile mass, no hepatomegaly, no splenomegaly, well-healed surgical scar EXT:  2 plus pulses upper and decreased dorsalis pedis bilaterally, no edema, no cyanosis no clubbing   EKG:  EKG is  ordered today. The ekg ordered today demonstrates sinus rhythm, rate 64, axis within normal limits, intervals within normal limits, poor anterior R wave progression, old anteroseptal infarct, nonspecific lateral T wave changes.  No change from previous.   Recent Labs:  02/10/2020: ALT 15; Magnesium 2.3 02/11/2020: Platelets 102 10/07/2020: BUN 17; Creatinine, Ser 1.00; Hemoglobin 13.9; Potassium 3.9; Sodium 138    Lipid Panel    Component Value Date/Time   CHOL 210 (H) 02/06/2013 0425   TRIG 377 (H) 02/06/2013 0425   HDL 32 (L) 02/06/2013 0425   CHOLHDL 6.6 02/06/2013 0425   VLDL 75 (H) 02/06/2013 0425   LDLCALC 103 (H) 02/06/2013 0425      Wt Readings from Last 3 Encounters:  02/01/21 216 lb (98 kg)  01/04/21 218 lb (98.9 kg)  10/07/20 213 lb 14.4 oz (97 kg)      Other studies Reviewed: Additional studies/ records that were reviewed today include: VVS notes, notes from recent surgery with Dr. Annabell Howells Review of the above records demonstrates:  Please see elsewhere in the note.     ASSESSMENT AND PLAN:   CAD/CABG:   The patient has no new sypmtoms.  No further cardiovascular testing is indicated.  Of note he is not taking aspirin because of an allergy.  I am going to start Plavix 75 mg daily.    HTN:    The blood pressure is elevated but he says it is always well controlled at home and he takes it every day.  He is in 120s systolic.  No  change in therapy.   AS:   This was mild in 2021.  No change by exam.  I am not planning further imaging at this point.    CAROTID STENOSIS:   He is followed for this and his stable claudication by Dr. Edilia Bo.  He will continue with risk reduction.   AAA:    He is now status post repair and doing well.   HYPERLIPIDEMIA:    He is intolerant of statins.  We talked about the fact that he is not taking his Praluent.  I sent a message to our pharmacist but I doubt we can do anything about $50 handling fee.  I would encourage him to work with the company or the pharmacy or find a means of getting that has its important.  DM:    A1C was 6.2.    No change in therapy.   Current medicines are reviewed at length with the patient today.  The patient does not have concerns regarding medicines.  The following changes have been made: As above  Labs/ tests ordered today include: None  No orders of the defined types were placed in this encounter.    Disposition:   FU with me in 12 months .     Signed, [name], MD  02/01/2021 9:37 AM    [facility]

## 2021-02-01 ENCOUNTER — Encounter: Payer: Self-pay | Admitting: Cardiology

## 2021-02-01 ENCOUNTER — Other Ambulatory Visit: Payer: Self-pay | Admitting: Cardiology

## 2021-02-01 ENCOUNTER — Telehealth: Payer: Self-pay

## 2021-02-01 ENCOUNTER — Other Ambulatory Visit: Payer: Self-pay

## 2021-02-01 ENCOUNTER — Ambulatory Visit: Payer: Medicare HMO | Admitting: Cardiology

## 2021-02-01 VITALS — BP 142/90 | HR 64 | Ht 71.0 in | Wt 216.0 lb

## 2021-02-01 DIAGNOSIS — I25810 Atherosclerosis of coronary artery bypass graft(s) without angina pectoris: Secondary | ICD-10-CM

## 2021-02-01 DIAGNOSIS — I350 Nonrheumatic aortic (valve) stenosis: Secondary | ICD-10-CM

## 2021-02-01 DIAGNOSIS — I714 Abdominal aortic aneurysm, without rupture, unspecified: Secondary | ICD-10-CM

## 2021-02-01 DIAGNOSIS — E785 Hyperlipidemia, unspecified: Secondary | ICD-10-CM | POA: Diagnosis not present

## 2021-02-01 DIAGNOSIS — I10 Essential (primary) hypertension: Secondary | ICD-10-CM | POA: Diagnosis not present

## 2021-02-01 MED ORDER — CLOPIDOGREL BISULFATE 75 MG PO TABS: 75.0000 mg | ORAL_TABLET | Freq: Every day | ORAL | 3 refills | Status: DC

## 2021-02-01 NOTE — Telephone Encounter (Signed)
-----   Message from Pearletha Furl, RPH-CPP sent at 02/01/2021  9:37 AM EDT ----- Cinda Quest will check with pharmacy and patient for me and we will provide any available assistance.  Looks like we have him on [employer], but will troubleshoot.  Thanks,  Raquel ----- Message ----- From: [name], MD Sent: 02/01/2021   9:30 AM EDT To: Pearletha Furl, RPH-CPP  [facility] charges a 50 dollar handling fee for Praluent.  Anything we can do about that.  I suspect not Thanks.

## 2021-02-01 NOTE — Patient Instructions (Signed)
Medication Instructions:  Please start Plavix (Clopidogrel) 75 mg a day. Continue all other medications as listed.  *If you need a refill on your cardiac medications before your next appointment, please call your pharmacy*  Follow-Up: At [facility], you and your health needs are our priority.  As part of our continuing mission to provide you with exceptional heart care, we have created designated Provider Care Teams.  These Care Teams include your primary Cardiologist (physician) and Advanced Practice Providers (APPs -  Physician Assistants and Nurse Practitioners) who all work together to provide you with the care you need, when you need it.  We recommend signing up for the patient portal called "MyChart".  Sign up information is provided on this After Visit Summary.  MyChart is used to connect with patients for Virtual Visits (Telemedicine).  Patients are able to view lab/test results, encounter notes, upcoming appointments, etc.  Non-urgent messages can be sent to your provider as well.   To learn more about what you can do with MyChart, go to [email or web address].    Your next appointment:   12 month(s)  The format for your next appointment:   In Person  Provider:   [name], MD   Thank you for choosing [facility]!!

## 2021-02-01 NOTE — Telephone Encounter (Signed)
Called and spoke w/pt's wife and stated that the [employer] is closed for funding and they voiced understanding

## 2021-02-14 DIAGNOSIS — R5383 Other fatigue: Secondary | ICD-10-CM | POA: Diagnosis not present

## 2021-02-14 DIAGNOSIS — E1165 Type 2 diabetes mellitus with hyperglycemia: Secondary | ICD-10-CM | POA: Diagnosis not present

## 2021-02-14 DIAGNOSIS — Z79899 Other long term (current) drug therapy: Secondary | ICD-10-CM | POA: Diagnosis not present

## 2021-02-14 DIAGNOSIS — I10 Essential (primary) hypertension: Secondary | ICD-10-CM | POA: Diagnosis not present

## 2021-02-14 DIAGNOSIS — Z7189 Other specified counseling: Secondary | ICD-10-CM | POA: Diagnosis not present

## 2021-02-14 DIAGNOSIS — Z1339 Encounter for screening examination for other mental health and behavioral disorders: Secondary | ICD-10-CM | POA: Diagnosis not present

## 2021-02-14 DIAGNOSIS — E7800 Pure hypercholesterolemia, unspecified: Secondary | ICD-10-CM | POA: Diagnosis not present

## 2021-02-14 DIAGNOSIS — Z1331 Encounter for screening for depression: Secondary | ICD-10-CM | POA: Diagnosis not present

## 2021-02-14 DIAGNOSIS — Z6829 Body mass index (BMI) 29.0-29.9, adult: Secondary | ICD-10-CM | POA: Diagnosis not present

## 2021-02-14 DIAGNOSIS — Z125 Encounter for screening for malignant neoplasm of prostate: Secondary | ICD-10-CM | POA: Diagnosis not present

## 2021-02-14 DIAGNOSIS — Z0000 Encounter for general adult medical examination without abnormal findings: Secondary | ICD-10-CM | POA: Diagnosis not present

## 2021-02-14 DIAGNOSIS — Z299 Encounter for prophylactic measures, unspecified: Secondary | ICD-10-CM | POA: Diagnosis not present

## 2021-02-28 DIAGNOSIS — Z87891 Personal history of nicotine dependence: Secondary | ICD-10-CM | POA: Diagnosis not present

## 2021-02-28 DIAGNOSIS — M545 Low back pain, unspecified: Secondary | ICD-10-CM | POA: Diagnosis not present

## 2021-02-28 DIAGNOSIS — I10 Essential (primary) hypertension: Secondary | ICD-10-CM | POA: Diagnosis not present

## 2021-02-28 DIAGNOSIS — Z299 Encounter for prophylactic measures, unspecified: Secondary | ICD-10-CM | POA: Diagnosis not present

## 2021-03-26 DIAGNOSIS — E1165 Type 2 diabetes mellitus with hyperglycemia: Secondary | ICD-10-CM | POA: Diagnosis not present

## 2021-03-26 DIAGNOSIS — I10 Essential (primary) hypertension: Secondary | ICD-10-CM | POA: Diagnosis not present

## 2021-04-26 DIAGNOSIS — E1165 Type 2 diabetes mellitus with hyperglycemia: Secondary | ICD-10-CM | POA: Diagnosis not present

## 2021-04-26 DIAGNOSIS — I10 Essential (primary) hypertension: Secondary | ICD-10-CM | POA: Diagnosis not present

## 2021-05-24 DIAGNOSIS — I10 Essential (primary) hypertension: Secondary | ICD-10-CM | POA: Diagnosis not present

## 2021-05-24 DIAGNOSIS — Z2821 Immunization not carried out because of patient refusal: Secondary | ICD-10-CM | POA: Diagnosis not present

## 2021-05-24 DIAGNOSIS — G729 Myopathy, unspecified: Secondary | ICD-10-CM | POA: Diagnosis not present

## 2021-05-24 DIAGNOSIS — E1165 Type 2 diabetes mellitus with hyperglycemia: Secondary | ICD-10-CM | POA: Diagnosis not present

## 2021-05-24 DIAGNOSIS — E1122 Type 2 diabetes mellitus with diabetic chronic kidney disease: Secondary | ICD-10-CM | POA: Diagnosis not present

## 2021-05-24 DIAGNOSIS — N183 Chronic kidney disease, stage 3 unspecified: Secondary | ICD-10-CM | POA: Diagnosis not present

## 2021-05-24 DIAGNOSIS — Z299 Encounter for prophylactic measures, unspecified: Secondary | ICD-10-CM | POA: Diagnosis not present

## 2021-05-26 DIAGNOSIS — E1165 Type 2 diabetes mellitus with hyperglycemia: Secondary | ICD-10-CM | POA: Diagnosis not present

## 2021-05-26 DIAGNOSIS — I10 Essential (primary) hypertension: Secondary | ICD-10-CM | POA: Diagnosis not present

## 2021-06-01 ENCOUNTER — Encounter (HOSPITAL_COMMUNITY): Payer: Medicare HMO

## 2021-06-13 DIAGNOSIS — M7751 Other enthesopathy of right foot: Secondary | ICD-10-CM | POA: Diagnosis not present

## 2021-06-26 DIAGNOSIS — I10 Essential (primary) hypertension: Secondary | ICD-10-CM | POA: Diagnosis not present

## 2021-06-26 DIAGNOSIS — E7800 Pure hypercholesterolemia, unspecified: Secondary | ICD-10-CM | POA: Diagnosis not present

## 2021-06-28 DIAGNOSIS — L501 Idiopathic urticaria: Secondary | ICD-10-CM | POA: Diagnosis not present

## 2021-06-28 DIAGNOSIS — Z79899 Other long term (current) drug therapy: Secondary | ICD-10-CM | POA: Diagnosis not present

## 2021-06-28 DIAGNOSIS — I10 Essential (primary) hypertension: Secondary | ICD-10-CM | POA: Diagnosis not present

## 2021-06-28 DIAGNOSIS — E7800 Pure hypercholesterolemia, unspecified: Secondary | ICD-10-CM | POA: Diagnosis not present

## 2021-06-28 DIAGNOSIS — M542 Cervicalgia: Secondary | ICD-10-CM | POA: Diagnosis not present

## 2021-06-28 DIAGNOSIS — Z299 Encounter for prophylactic measures, unspecified: Secondary | ICD-10-CM | POA: Diagnosis not present

## 2021-06-29 ENCOUNTER — Ambulatory Visit (INDEPENDENT_AMBULATORY_CARE_PROVIDER_SITE_OTHER): Payer: Medicare HMO

## 2021-06-29 DIAGNOSIS — I6523 Occlusion and stenosis of bilateral carotid arteries: Secondary | ICD-10-CM

## 2021-07-05 ENCOUNTER — Encounter: Payer: Self-pay | Admitting: *Deleted

## 2021-07-05 DIAGNOSIS — M542 Cervicalgia: Secondary | ICD-10-CM | POA: Diagnosis not present

## 2021-07-05 DIAGNOSIS — M79604 Pain in right leg: Secondary | ICD-10-CM | POA: Diagnosis not present

## 2021-07-05 DIAGNOSIS — M2550 Pain in unspecified joint: Secondary | ICD-10-CM | POA: Diagnosis not present

## 2021-07-05 DIAGNOSIS — Z299 Encounter for prophylactic measures, unspecified: Secondary | ICD-10-CM | POA: Diagnosis not present

## 2021-07-05 DIAGNOSIS — I10 Essential (primary) hypertension: Secondary | ICD-10-CM | POA: Diagnosis not present

## 2021-08-16 ENCOUNTER — Other Ambulatory Visit (HOSPITAL_COMMUNITY): Payer: Self-pay | Admitting: Cardiology

## 2021-08-16 DIAGNOSIS — I6523 Occlusion and stenosis of bilateral carotid arteries: Secondary | ICD-10-CM

## 2021-09-01 DIAGNOSIS — I739 Peripheral vascular disease, unspecified: Secondary | ICD-10-CM | POA: Diagnosis not present

## 2021-09-01 DIAGNOSIS — E1122 Type 2 diabetes mellitus with diabetic chronic kidney disease: Secondary | ICD-10-CM | POA: Diagnosis not present

## 2021-09-01 DIAGNOSIS — Z299 Encounter for prophylactic measures, unspecified: Secondary | ICD-10-CM | POA: Diagnosis not present

## 2021-09-01 DIAGNOSIS — I700 Atherosclerosis of aorta: Secondary | ICD-10-CM | POA: Diagnosis not present

## 2021-09-01 DIAGNOSIS — I10 Essential (primary) hypertension: Secondary | ICD-10-CM | POA: Diagnosis not present

## 2021-09-01 DIAGNOSIS — N1831 Chronic kidney disease, stage 3a: Secondary | ICD-10-CM | POA: Diagnosis not present

## 2021-09-18 ENCOUNTER — Other Ambulatory Visit: Payer: Self-pay

## 2021-09-18 ENCOUNTER — Ambulatory Visit (INDEPENDENT_AMBULATORY_CARE_PROVIDER_SITE_OTHER): Payer: Medicare HMO

## 2021-09-18 DIAGNOSIS — I6523 Occlusion and stenosis of bilateral carotid arteries: Secondary | ICD-10-CM | POA: Diagnosis not present

## 2021-09-20 ENCOUNTER — Other Ambulatory Visit (HOSPITAL_BASED_OUTPATIENT_CLINIC_OR_DEPARTMENT_OTHER): Payer: Self-pay | Admitting: *Deleted

## 2021-09-20 DIAGNOSIS — I6523 Occlusion and stenosis of bilateral carotid arteries: Secondary | ICD-10-CM

## 2021-10-12 DIAGNOSIS — M542 Cervicalgia: Secondary | ICD-10-CM | POA: Diagnosis not present

## 2021-10-12 DIAGNOSIS — E1165 Type 2 diabetes mellitus with hyperglycemia: Secondary | ICD-10-CM | POA: Diagnosis not present

## 2021-10-12 DIAGNOSIS — Z79899 Other long term (current) drug therapy: Secondary | ICD-10-CM | POA: Diagnosis not present

## 2021-10-12 DIAGNOSIS — Z299 Encounter for prophylactic measures, unspecified: Secondary | ICD-10-CM | POA: Diagnosis not present

## 2021-10-12 DIAGNOSIS — Z789 Other specified health status: Secondary | ICD-10-CM | POA: Diagnosis not present

## 2021-10-12 DIAGNOSIS — I10 Essential (primary) hypertension: Secondary | ICD-10-CM | POA: Diagnosis not present

## 2021-10-12 DIAGNOSIS — K432 Incisional hernia without obstruction or gangrene: Secondary | ICD-10-CM | POA: Diagnosis not present

## 2021-10-12 DIAGNOSIS — K4090 Unilateral inguinal hernia, without obstruction or gangrene, not specified as recurrent: Secondary | ICD-10-CM | POA: Diagnosis not present

## 2021-11-23 DIAGNOSIS — I10 Essential (primary) hypertension: Secondary | ICD-10-CM | POA: Diagnosis not present

## 2021-11-23 DIAGNOSIS — E785 Hyperlipidemia, unspecified: Secondary | ICD-10-CM | POA: Diagnosis not present

## 2021-11-29 DIAGNOSIS — I10 Essential (primary) hypertension: Secondary | ICD-10-CM | POA: Diagnosis not present

## 2021-11-29 DIAGNOSIS — K4020 Bilateral inguinal hernia, without obstruction or gangrene, not specified as recurrent: Secondary | ICD-10-CM | POA: Diagnosis not present

## 2021-11-29 DIAGNOSIS — Z299 Encounter for prophylactic measures, unspecified: Secondary | ICD-10-CM | POA: Diagnosis not present

## 2021-12-05 DIAGNOSIS — I10 Essential (primary) hypertension: Secondary | ICD-10-CM | POA: Diagnosis not present

## 2021-12-05 DIAGNOSIS — Z299 Encounter for prophylactic measures, unspecified: Secondary | ICD-10-CM | POA: Diagnosis not present

## 2021-12-05 DIAGNOSIS — E1165 Type 2 diabetes mellitus with hyperglycemia: Secondary | ICD-10-CM | POA: Diagnosis not present

## 2021-12-11 DIAGNOSIS — K4020 Bilateral inguinal hernia, without obstruction or gangrene, not specified as recurrent: Secondary | ICD-10-CM | POA: Diagnosis not present

## 2021-12-18 DIAGNOSIS — K4020 Bilateral inguinal hernia, without obstruction or gangrene, not specified as recurrent: Secondary | ICD-10-CM | POA: Diagnosis not present

## 2021-12-19 DIAGNOSIS — K4020 Bilateral inguinal hernia, without obstruction or gangrene, not specified as recurrent: Secondary | ICD-10-CM | POA: Diagnosis not present

## 2021-12-19 DIAGNOSIS — I350 Nonrheumatic aortic (valve) stenosis: Secondary | ICD-10-CM | POA: Diagnosis not present

## 2021-12-19 DIAGNOSIS — E785 Hyperlipidemia, unspecified: Secondary | ICD-10-CM | POA: Diagnosis not present

## 2021-12-19 DIAGNOSIS — E119 Type 2 diabetes mellitus without complications: Secondary | ICD-10-CM | POA: Diagnosis not present

## 2021-12-19 DIAGNOSIS — I10 Essential (primary) hypertension: Secondary | ICD-10-CM | POA: Diagnosis not present

## 2021-12-19 DIAGNOSIS — I2510 Atherosclerotic heart disease of native coronary artery without angina pectoris: Secondary | ICD-10-CM | POA: Diagnosis not present

## 2021-12-19 DIAGNOSIS — R69 Illness, unspecified: Secondary | ICD-10-CM | POA: Diagnosis not present

## 2022-01-30 DIAGNOSIS — Z0100 Encounter for examination of eyes and vision without abnormal findings: Secondary | ICD-10-CM | POA: Diagnosis not present

## 2022-01-30 DIAGNOSIS — H524 Presbyopia: Secondary | ICD-10-CM | POA: Diagnosis not present

## 2022-01-30 DIAGNOSIS — H5213 Myopia, bilateral: Secondary | ICD-10-CM | POA: Diagnosis not present

## 2022-01-30 DIAGNOSIS — H52229 Regular astigmatism, unspecified eye: Secondary | ICD-10-CM | POA: Diagnosis not present

## 2022-01-30 DIAGNOSIS — H5203 Hypermetropia, bilateral: Secondary | ICD-10-CM | POA: Diagnosis not present

## 2022-02-19 DIAGNOSIS — E7800 Pure hypercholesterolemia, unspecified: Secondary | ICD-10-CM | POA: Diagnosis not present

## 2022-02-19 DIAGNOSIS — Z299 Encounter for prophylactic measures, unspecified: Secondary | ICD-10-CM | POA: Diagnosis not present

## 2022-02-19 DIAGNOSIS — Z1339 Encounter for screening examination for other mental health and behavioral disorders: Secondary | ICD-10-CM | POA: Diagnosis not present

## 2022-02-19 DIAGNOSIS — Z79899 Other long term (current) drug therapy: Secondary | ICD-10-CM | POA: Diagnosis not present

## 2022-02-19 DIAGNOSIS — Z789 Other specified health status: Secondary | ICD-10-CM | POA: Diagnosis not present

## 2022-02-19 DIAGNOSIS — Z6829 Body mass index (BMI) 29.0-29.9, adult: Secondary | ICD-10-CM | POA: Diagnosis not present

## 2022-02-19 DIAGNOSIS — I10 Essential (primary) hypertension: Secondary | ICD-10-CM | POA: Diagnosis not present

## 2022-02-19 DIAGNOSIS — Z7189 Other specified counseling: Secondary | ICD-10-CM | POA: Diagnosis not present

## 2022-02-19 DIAGNOSIS — R5383 Other fatigue: Secondary | ICD-10-CM | POA: Diagnosis not present

## 2022-02-19 DIAGNOSIS — Z125 Encounter for screening for malignant neoplasm of prostate: Secondary | ICD-10-CM | POA: Diagnosis not present

## 2022-02-19 DIAGNOSIS — Z0000 Encounter for general adult medical examination without abnormal findings: Secondary | ICD-10-CM | POA: Diagnosis not present

## 2022-02-19 DIAGNOSIS — Z1331 Encounter for screening for depression: Secondary | ICD-10-CM | POA: Diagnosis not present

## 2022-02-23 DIAGNOSIS — I10 Essential (primary) hypertension: Secondary | ICD-10-CM | POA: Diagnosis not present

## 2022-02-23 DIAGNOSIS — E1165 Type 2 diabetes mellitus with hyperglycemia: Secondary | ICD-10-CM | POA: Diagnosis not present

## 2022-03-02 DIAGNOSIS — M109 Gout, unspecified: Secondary | ICD-10-CM | POA: Diagnosis not present

## 2022-03-02 DIAGNOSIS — M25571 Pain in right ankle and joints of right foot: Secondary | ICD-10-CM | POA: Diagnosis not present

## 2022-03-02 DIAGNOSIS — M7751 Other enthesopathy of right foot: Secondary | ICD-10-CM | POA: Diagnosis not present

## 2022-03-05 DIAGNOSIS — M542 Cervicalgia: Secondary | ICD-10-CM | POA: Diagnosis not present

## 2022-03-05 DIAGNOSIS — Z299 Encounter for prophylactic measures, unspecified: Secondary | ICD-10-CM | POA: Diagnosis not present

## 2022-03-05 DIAGNOSIS — I10 Essential (primary) hypertension: Secondary | ICD-10-CM | POA: Diagnosis not present

## 2022-03-05 DIAGNOSIS — M25571 Pain in right ankle and joints of right foot: Secondary | ICD-10-CM | POA: Diagnosis not present

## 2022-03-05 DIAGNOSIS — R52 Pain, unspecified: Secondary | ICD-10-CM | POA: Diagnosis not present

## 2022-03-05 DIAGNOSIS — M959 Acquired deformity of musculoskeletal system, unspecified: Secondary | ICD-10-CM | POA: Diagnosis not present

## 2022-03-05 DIAGNOSIS — M7989 Other specified soft tissue disorders: Secondary | ICD-10-CM | POA: Diagnosis not present

## 2022-03-05 NOTE — Progress Notes (Deleted)
Cardiology Office Note   Date:  03/05/2022   ID:  Tony [name], DOB Feb 21, 1941, MRN [id number]  PCP:  Ignatius Specking, MD  Cardiologist:   [name], MD   No chief complaint on file.     History of Present Illness: Tony [name] is a 81 y.o. male who presents for followup after CABG in June of 2014.   Since I last saw him he had bilateral hydrocelectomy by Dr. Annabell Howells.   He had open repair of an AAA.  He did well with both of his surgeries finally. The patient denies any new symptoms such as chest discomfort, neck or arm discomfort. There has been no new shortness of breath, PND or orthopnea. There have been no reported palpitations, presyncope or syncope.  He does have some stable claudication.  He has some ankle pain related to previous injury.  He has not been able to afford the Praluent.  He returns for follow up.  ***     ***  Though he apparently has a scholarship they are charging a "$50 handling feet.".  He has been started on fenofibrate for hypertriglyceridemia.  He has labs pending.   Past Medical History:  Diagnosis Date   Aortic valve stenosis, mild    last echo in epic 03-22-2020, valve area 2.01 cm^2   Arthritis    Bilateral hydrocele    CKD (chronic kidney disease), stage III (HCC)    Claudication of left lower extremity (HCC)    per pt when walks, go up stairs   Coronary artery disease cardiologist--- dr Aubriel Khanna   s/p  severe 3V CAD,  CABG x3 02-09-2013; s/p AAA and bi-iliac bypass graft 02-09-2020   Heart murmur    as a child   History of gout    09-07-2020  per pt has been several yrs, effects right great toe and trigger by asa   Hyperlipidemia    Hypertension    Occlusion and stenosis of bilateral carotid arteries    per last duplex in epic 05-05-2020 right ICA 40-59%, total occlusion left ICA   PAD (peripheral artery disease) (HCC)    known bilateral infrainguinal artial occlusive disease   PVD (peripheral vascular disease) (HCC)    vascular  --- dr Edilia Bo---  s/p  AAA repair and aorta bi-iliac bypass graft   S/P AAA (abdominal aortic aneurysm) repair 02/09/2020   S/P CABG x 3 02/09/2013   LIMA - LAD,  SVG -- RCA,  seqSVG -- OM1 and distal CFx   Type 2 diabetes mellitus (HCC)    followed by pcp---- (09-07-2020 per pt checks blood sugar in am dialy,  fasting sugar--- 118--130)   Wears glasses     Past Surgical History:  Procedure Laterality Date   ABDOMINAL AORTIC ANEURYSM REPAIR N/A 02/09/2020   Procedure: ABDOMINAL AORTIC ANEURYSM REPAIR;  Surgeon: Chuck Hint, MD;  Location: [facility] OR;  Service: Vascular;  Laterality: N/A;   ANTERIOR CERVICAL DECOMP/DISCECTOMY FUSION  12-13-2008  @ [facility]   C5--6   AORTA - BILATERAL FEMORAL ARTERY BYPASS GRAFT Bilateral 02/09/2020   Procedure: AORTA BI-ILIAC  BYPASS GRAFT;  Surgeon: Chuck Hint, MD;  Location: [facility] OR;  Service: Vascular;  Laterality: Bilateral;   AORTIC ENDARTERECETOMY N/A 02/09/2020   Procedure: AORTIC ENDARTERECETOMY;  Surgeon: Chuck Hint, MD;  Location: [facility] OR;  Service: Vascular;  Laterality: N/A;   CARDIAC CATHETERIZATION  02-05-2013  dr Chellsea Beckers   severe 3V CAD, preserved EF   CARDIAC CATHETERIZATION  per pt 1993   had one blockage with no intervention per cardiology note   CORONARY ARTERY BYPASS GRAFT N/A 02/09/2013   Procedure: CORONARY ARTERY BYPASS GRAFTING (CABG);  Surgeon: Ivin Poot, MD;  Location: [place];  Service: Open Heart Surgery;  Laterality: N/A;  LIMA to the LAD, SVG to right coronary artery, sequential SVG to OM and distal circumflex.   HYDROCELE EXCISION Bilateral 09/09/2020   Procedure: BILATERAL HYDROCELECTOMY ADULT;  Surgeon: Irine Seal, MD;  Location: [facility];  Service: Urology;  Laterality: Bilateral;   INTRAOPERATIVE TRANSESOPHAGEAL ECHOCARDIOGRAM N/A 02/09/2013   Procedure: INTRAOPERATIVE TRANSESOPHAGEAL ECHOCARDIOGRAM;  Surgeon: Ivin Poot, MD;  Location: [place];  Service: Open Heart Surgery;   Laterality: N/A;   LUMBAR FUSION  08-04-2007  @[facility]    L3 -- L5   REVERSE SHOULDER ARTHROPLASTY Right 06/25/2018   Procedure: RIGHT REVERSE SHOULDER ARTHROPLASTY;  Surgeon: Hiram Gash, MD;  Location: [place];  Service: Orthopedics;  Laterality: Right;   SCROTAL EXPLORATION Bilateral 10/07/2020   Procedure: SCROTUM EXPLORATION, DRAINAGE OF HYDOCELE;  Surgeon: Irine Seal, MD;  Location: [facility];  Service: Urology;  Laterality: Bilateral;   Prior to Admission medications   Medication Sig Start Date End Date Taking? Authorizing Provider  benazepril (LOTENSIN) 20 MG tablet Take 20 mg by mouth every evening. 05/30/20  Yes [provider]  diclofenac Sodium (VOLTAREN) 1 % GEL Apply 2 g topically daily as needed.   Yes [provider]  fenofibrate 160 MG tablet Take 160 mg by mouth daily. 01/03/21  Yes [provider]  glipiZIDE (GLUCOTROL) 5 MG tablet Take 5 mg by mouth 2 (two) times daily.   Yes [provider]  Alirocumab (PRALUENT) 150 MG/ML SOAJ Inject 150 mg into the skin every 14 (fourteen) days. Patient not taking: No sig reported 10/12/19   Minus Breeding, MD  HYDROcodone-acetaminophen (NORCO/VICODIN) 5-325 MG tablet Take 1 tablet by mouth every 6 (six) hours as needed for moderate pain. Patient not taking: No sig reported 10/07/20 10/07/21  Irine Seal, MD     Allergies:   Aspirin and Statins    ROS:  Please see the history of present illness.   Otherwise, review of systeM are positive for ***.   All other systems are reviewed and negative.    PHYSICAL EXAM: VS:  There were no vitals taken for this visit. , BMI There is no height or weight on file to calculate BMI. GENERAL:  Well appearing NECK:  No jugular venous distention, waveform within normal limits, carotid upstroke brisk and symmetric, no bruits, no thyromegaly LUNGS:  Clear to auscultation bilaterally CHEST:  Well healed sternotomy scar. HEART:  PMI not displaced or  sustained,S1 and S2 within normal limits, no S3, no S4, no clicks, no rubs, *** murmurs ABD:  Flat, positive bowel sounds normal in frequency in pitch, no bruits, no rebound, no guarding, no midline pulsatile mass, no hepatomegaly, no splenomegaly EXT:  2 plus pulses throughout, no edema, no cyanosis no clubbing     ***GENERAL:  Well appearing NECK:  No jugular venous distention, waveform within normal limits, carotid upstroke brisk and symmetric, no bruits, no thyromegaly LUNGS:  Clear to auscultation bilaterally CHEST:  Well healed sternotomy scar. HEART:  PMI not displaced or sustained,S1 and S2 within normal limits, no S3, no S4, no clicks, no rubs, 3 out of 6 systolic murmur radiating slightly at the aortic outflow tract and early peaking, no diastolic murmurs ABD:  Flat, positive  bowel sounds normal in frequency in pitch, no bruits, no rebound, no guarding, no midline pulsatile mass, no hepatomegaly, no splenomegaly, well-healed surgical scar EXT:  2 plus pulses upper and decreased dorsalis pedis bilaterally, no edema, no cyanosis no clubbing   EKG:  EKG is *** ordered today. The ekg ordered today demonstrates sinus rhythm, rate ***, axis within normal limits, intervals within normal limits, poor anterior R wave progression, old anteroseptal infarct, nonspecific lateral T wave changes.  No change from previous.   Recent Labs: No results found for requested labs within last 365 days.    Lipid Panel    Component Value Date/Time   CHOL 210 (H) 02/06/2013 0425   TRIG 377 (H) 02/06/2013 0425   HDL 32 (L) 02/06/2013 0425   CHOLHDL 6.6 02/06/2013 0425   VLDL 75 (H) 02/06/2013 0425   LDLCALC 103 (H) 02/06/2013 0425      Wt Readings from Last 3 Encounters:  02/01/21 216 lb (98 kg)  01/04/21 218 lb (98.9 kg)  10/07/20 213 lb 14.4 oz (97 kg)      Other studies Reviewed: Additional studies/ records that were reviewed today include: *** Review of the above records demonstrates:   Please see elsewhere in the note.     ASSESSMENT AND PLAN:   CAD/CABG:  ***   The patient has no new sypmtoms.  No further cardiovascular testing is indicated.  Of note he is not taking aspirin because of an allergy.  I am going to start Plavix 75 mg daily.    HTN:    The blood pressure is *** elevated but he says it is always well controlled at home and he takes it every day.  He is in 120s systolic.  No change in therapy.   AS:   This was mild in 2021. ***   No change by exam.  I am not planning further imaging at this point.   CAROTID STENOSIS:    He is followed by [facility].  ***  e is followed for this and his stable claudication by Dr. Edilia Bo.  He will continue with risk reduction.   AAA:    He is now status post repair and doing well.   HYPERLIPIDEMIA:    He is intolerant of statins.  W*** e talked about the fact that he is not taking his Praluent.  I sent a message to our pharmacist but I doubt we can do anything about $50 handling fee.  I would encourage him to work with the company or the pharmacy or find a means of getting that has its important.  DM:    A1C was *** 6.2.    No change in therapy.   Current medicines are reviewed at length with the patient today.  The patient does not have concerns regarding medicines.  The following changes have been made: ***  Labs/ tests ordered today include: ***  No orders of the defined types were placed in this encounter.    Disposition:   FU with me in *** months .     Signed, [name], MD  03/05/2022 8:47 PM    [facility]

## 2022-03-06 DIAGNOSIS — L03115 Cellulitis of right lower limb: Secondary | ICD-10-CM | POA: Diagnosis not present

## 2022-03-07 ENCOUNTER — Ambulatory Visit: Payer: Medicare HMO | Admitting: Cardiology

## 2022-03-07 DIAGNOSIS — E118 Type 2 diabetes mellitus with unspecified complications: Secondary | ICD-10-CM

## 2022-03-07 DIAGNOSIS — I350 Nonrheumatic aortic (valve) stenosis: Secondary | ICD-10-CM

## 2022-03-07 DIAGNOSIS — I10 Essential (primary) hypertension: Secondary | ICD-10-CM

## 2022-03-07 DIAGNOSIS — E785 Hyperlipidemia, unspecified: Secondary | ICD-10-CM

## 2022-03-07 DIAGNOSIS — I25810 Atherosclerosis of coronary artery bypass graft(s) without angina pectoris: Secondary | ICD-10-CM

## 2022-03-20 DIAGNOSIS — M75101 Unspecified rotator cuff tear or rupture of right shoulder, not specified as traumatic: Secondary | ICD-10-CM | POA: Diagnosis not present

## 2022-04-20 DIAGNOSIS — E1159 Type 2 diabetes mellitus with other circulatory complications: Secondary | ICD-10-CM | POA: Diagnosis not present

## 2022-04-20 DIAGNOSIS — Z6829 Body mass index (BMI) 29.0-29.9, adult: Secondary | ICD-10-CM | POA: Diagnosis not present

## 2022-04-20 DIAGNOSIS — I10 Essential (primary) hypertension: Secondary | ICD-10-CM | POA: Diagnosis not present

## 2022-04-20 DIAGNOSIS — I152 Hypertension secondary to endocrine disorders: Secondary | ICD-10-CM | POA: Diagnosis not present

## 2022-04-20 DIAGNOSIS — Z299 Encounter for prophylactic measures, unspecified: Secondary | ICD-10-CM | POA: Diagnosis not present

## 2022-04-26 DIAGNOSIS — I10 Essential (primary) hypertension: Secondary | ICD-10-CM | POA: Diagnosis not present

## 2022-04-26 DIAGNOSIS — N183 Chronic kidney disease, stage 3 unspecified: Secondary | ICD-10-CM | POA: Diagnosis not present

## 2022-04-26 DIAGNOSIS — E1165 Type 2 diabetes mellitus with hyperglycemia: Secondary | ICD-10-CM | POA: Diagnosis not present

## 2022-05-29 NOTE — Progress Notes (Unsigned)
Cardiology Office Note   Date:  05/30/2022   ID:  Tony [name], DOB 06/16/1941, MRN [id number]  PCP:  Glenda Chroman, MD  Cardiologist:   Minus Breeding, MD   Chief Complaint  Patient presents with   Coronary Artery Disease      History of Present Illness: Tony [name] is a 81 y.o. male who presents for followup after CABG in June of 2014.   He had open repair of an AAA.  Inguinal hernia was done in April and he did well with this.  He has had some exertional chest discomfort in the past year.  He says he walks about 15 degree incline about 125 feet and he might have some burning discomfort but this has been stable over the years.  He denies any resting symptoms.  He does not think the symptoms are getting worse.  He is not having any new shortness of breath, PND or orthopnea.  Is not had any new palpitations, presyncope or syncope.  He has had no weight gain or edema.   Past Medical History:  Diagnosis Date   Aortic valve stenosis, mild    last echo in epic 03-22-2020, valve area 2.01 cm^2   Arthritis    Bilateral hydrocele    CKD (chronic kidney disease), stage III ([place])    Claudication of left lower extremity ([place])    per pt when walks, go up stairs   Coronary artery disease cardiologist--- dr Jontay Maston   s/p  severe 3V CAD,  CABG x3 02-09-2013; s/p AAA and bi-iliac bypass graft 02-09-2020   Heart murmur    as a child   History of gout    09-07-2020  per pt has been several yrs, effects right great toe and trigger by asa   Hyperlipidemia    Hypertension    Occlusion and stenosis of bilateral carotid arteries    per last duplex in epic 05-05-2020 right ICA 40-59%, total occlusion left ICA   PAD (peripheral artery disease) ([place])    known bilateral infrainguinal artial occlusive disease   PVD (peripheral vascular disease) ([place])    vascular --- dr Scot Dock---  s/p  AAA repair and aorta bi-iliac bypass graft   S/P AAA (abdominal aortic aneurysm) repair 02/09/2020    S/P CABG x 3 02/09/2013   LIMA - LAD,  SVG -- RCA,  seqSVG -- OM1 and distal CFx   Type 2 diabetes mellitus ([place])    followed by pcp---- (09-07-2020 per pt checks blood sugar in am dialy,  fasting sugar--- 118--130)   Wears glasses     Past Surgical History:  Procedure Laterality Date   ABDOMINAL AORTIC ANEURYSM REPAIR N/A 02/09/2020   Procedure: ABDOMINAL AORTIC ANEURYSM REPAIR;  Surgeon: Angelia Mould, MD;  Location: [place];  Service: Vascular;  Laterality: N/A;   ANTERIOR CERVICAL DECOMP/DISCECTOMY FUSION  12-13-2008  @ [facility]   C5--6   AORTA - BILATERAL FEMORAL ARTERY BYPASS GRAFT Bilateral 02/09/2020   Procedure: AORTA BI-ILIAC  BYPASS GRAFT;  Surgeon: Angelia Mould, MD;  Location: [place];  Service: Vascular;  Laterality: Bilateral;   AORTIC ENDARTERECETOMY N/A 02/09/2020   Procedure: AORTIC ENDARTERECETOMY;  Surgeon: Angelia Mould, MD;  Location: [place];  Service: Vascular;  Laterality: N/A;   CARDIAC CATHETERIZATION  02-05-2013  dr Lakisa Lotz   severe 3V CAD, preserved EF   CARDIAC CATHETERIZATION  per pt 1993   had one blockage with no intervention per cardiology note  CORONARY ARTERY BYPASS GRAFT N/A 02/09/2013   Procedure: CORONARY ARTERY BYPASS GRAFTING (CABG);  Surgeon: Ivin Poot, MD;  Location: [place];  Service: Open Heart Surgery;  Laterality: N/A;  LIMA to the LAD, SVG to right coronary artery, sequential SVG to OM and distal circumflex.   HYDROCELE EXCISION Bilateral 09/09/2020   Procedure: BILATERAL HYDROCELECTOMY ADULT;  Surgeon: Irine Seal, MD;  Location: [facility];  Service: Urology;  Laterality: Bilateral;   INTRAOPERATIVE TRANSESOPHAGEAL ECHOCARDIOGRAM N/A 02/09/2013   Procedure: INTRAOPERATIVE TRANSESOPHAGEAL ECHOCARDIOGRAM;  Surgeon: Ivin Poot, MD;  Location: [place];  Service: Open Heart Surgery;  Laterality: N/A;   LUMBAR FUSION  08-04-2007  @[facility]    L3 -- L5   REVERSE SHOULDER ARTHROPLASTY Right 06/25/2018   Procedure:  RIGHT REVERSE SHOULDER ARTHROPLASTY;  Surgeon: Hiram Gash, MD;  Location: [place];  Service: Orthopedics;  Laterality: Right;   SCROTAL EXPLORATION Bilateral 10/07/2020   Procedure: SCROTUM EXPLORATION, DRAINAGE OF HYDOCELE;  Surgeon: Irine Seal, MD;  Location: [facility];  Service: Urology;  Laterality: Bilateral;   Prior to Admission medications   Medication Sig Start Date End Date Taking? Authorizing Provider  Alirocumab (PRALUENT) 150 MG/ML SOAJ Inject 150 mg into the skin every 14 (fourteen) days. Patient not taking: No sig reported 10/12/19   Minus Breeding, MD  benazepril (LOTENSIN) 20 MG tablet Take 20 mg by mouth every evening. 05/30/20   [provider]  clopidogrel (PLAVIX) 75 MG tablet Take 1 tablet (75 mg total) by mouth daily. 02/01/21   Minus Breeding, MD  diclofenac Sodium (VOLTAREN) 1 % GEL Apply 2 g topically daily as needed.    [provider]  fenofibrate 160 MG tablet Take 160 mg by mouth daily. 01/03/21   [provider]  glipiZIDE (GLUCOTROL) 5 MG tablet Take 5 mg by mouth 2 (two) times daily.    [provider]     Allergies:   Aspirin and Statins    ROS:  Please see the history of present illness.   Otherwise, review of systeM are positive for none.   All other systems are reviewed and negative.    PHYSICAL EXAM: VS:  BP (!) 146/80   Pulse 82   Ht 5\' 11"  (1.803 m)   Wt 215 lb (97.5 kg)   BMI 29.99 kg/m  , BMI Body mass index is 29.99 kg/m. GENERAL:  Well appearing NECK:  No jugular venous distention, waveform within normal limits, carotid upstroke brisk and symmetric, no bruits, no thyromegaly LUNGS:  Clear to auscultation bilaterally CHEST: Well-healed surgical scar HEART:  PMI not displaced or sustained,S1 and S2 within normal limits, no S3, no S4, no clicks, no rubs, 3 out of 6 systolic murmur early to mid peaking, no diastolic murmurs ABD:  Flat, positive bowel sounds normal in frequency in pitch, no  bruits, no rebound, no guarding, no midline pulsatile mass, no hepatomegaly, no splenomegaly EXT:  2 plus pulses upper, mild bilateral decreased dorsalis pedis posterior tibialis, nonpitting right leg swelling ,  no cyanosis no clubbing   EKG:  EKG is   ordered today. The ekg ordered today demonstrates sinus rhythm, rate 82, axis within normal limits, intervals within normal limits, poor anterior R wave progression, old anteroseptal infarct, nonspecific lateral T wave changes.  No change from previous.   Recent Labs: No results found for requested labs within last 365 days.    Lipid Panel    Component Value Date/Time   CHOL 210 (H)  02/06/2013 0425   TRIG 377 (H) 02/06/2013 0425   HDL 32 (L) 02/06/2013 0425   CHOLHDL 6.6 02/06/2013 0425   VLDL 75 (H) 02/06/2013 0425   LDLCALC 103 (H) 02/06/2013 0425      Wt Readings from Last 3 Encounters:  05/30/22 215 lb (97.5 kg)  02/01/21 216 lb (98 kg)  01/04/21 218 lb (98.9 kg)      Other studies Reviewed: Additional studies/ records that were reviewed today include: Labs Review of the above records demonstrates:  Please see elsewhere in the note.     ASSESSMENT AND PLAN:   CAD/CABG:   The patient has no new sypmtoms.  No further cardiovascular testing is indicated.  We will continue with aggressive risk reduction and meds as listed.  HTN:    The blood pressure is at target.  No change in therapy.  AS:   This was mild in 2021.  We will repeat an echocardiogram.  CAROTID STENOSIS:    He is followed by [facility].   AAA:    He is status post repair.   HYPERLIPIDEMIA:     He is intolerant of statins.  He could not afford PCSK9.  He is taking fenofibrate.  I am going to add Zetia 10 mg daily.  I sent a message to see if he would qualify for any clinical trials.  DM:    A1C was 5.9.  No change in therapy.   Current medicines are reviewed at length with the patient today.  The patient does not have concerns regarding medicines.  The  following changes have been made:  None  Labs/ tests ordered today include:    None    Orders Placed This Encounter  Procedures   EKG 12-Lead   ECHOCARDIOGRAM COMPLETE     Disposition:   FU with me in 12 months .     Signed, Minus Breeding, MD  05/30/2022 2:14 PM    [place]

## 2022-05-30 ENCOUNTER — Ambulatory Visit: Payer: Medicare HMO | Admitting: Cardiology

## 2022-05-30 ENCOUNTER — Encounter: Payer: Self-pay | Admitting: Cardiology

## 2022-05-30 VITALS — BP 146/80 | HR 82 | Ht 71.0 in | Wt 215.0 lb

## 2022-05-30 DIAGNOSIS — E785 Hyperlipidemia, unspecified: Secondary | ICD-10-CM

## 2022-05-30 DIAGNOSIS — E118 Type 2 diabetes mellitus with unspecified complications: Secondary | ICD-10-CM | POA: Diagnosis not present

## 2022-05-30 DIAGNOSIS — I6529 Occlusion and stenosis of unspecified carotid artery: Secondary | ICD-10-CM | POA: Diagnosis not present

## 2022-05-30 DIAGNOSIS — I10 Essential (primary) hypertension: Secondary | ICD-10-CM

## 2022-05-30 DIAGNOSIS — I714 Abdominal aortic aneurysm, without rupture, unspecified: Secondary | ICD-10-CM

## 2022-05-30 DIAGNOSIS — I25810 Atherosclerosis of coronary artery bypass graft(s) without angina pectoris: Secondary | ICD-10-CM

## 2022-05-30 DIAGNOSIS — I350 Nonrheumatic aortic (valve) stenosis: Secondary | ICD-10-CM | POA: Diagnosis not present

## 2022-05-30 MED ORDER — EZETIMIBE 10 MG PO TABS: 10.0000 mg | ORAL_TABLET | Freq: Every day | ORAL | 3 refills | Status: DC

## 2022-05-30 NOTE — Patient Instructions (Signed)
Medication Instructions:  Please start Zetia 10 mg a day. Continue all other medications as listed.  *If you need a refill on your cardiac medications before your next appointment, please call your pharmacy*  Testing/Procedures: Your physician has requested that you have an echocardiogram. Echocardiography is a painless test that uses sound waves to create images of your heart. It provides your doctor with information about the size and shape of your heart and how well your heart's chambers and valves are working. This procedure takes approximately one hour. There are no restrictions for this procedure.  Follow-Up: At [facility], you and your health needs are our priority.  As part of our continuing mission to provide you with exceptional heart care, we have created designated Provider Care Teams.  These Care Teams include your primary Cardiologist (physician) and Advanced Practice Providers (APPs -  Physician Assistants and Nurse Practitioners) who all work together to provide you with the care you need, when you need it.  We recommend signing up for the patient portal called "MyChart".  Sign up information is provided on this After Visit Summary.  MyChart is used to connect with patients for Virtual Visits (Telemedicine).  Patients are able to view lab/test results, encounter notes, upcoming appointments, etc.  Non-urgent messages can be sent to your provider as well.   To learn more about what you can do with MyChart, go to [email or web address].    Your next appointment:   1 year(s)  The format for your next appointment:   In Person  Provider:   Minus Breeding, MD     Important Information About [place]

## 2022-05-31 ENCOUNTER — Ambulatory Visit: Payer: Medicare HMO | Attending: Cardiology

## 2022-05-31 DIAGNOSIS — I350 Nonrheumatic aortic (valve) stenosis: Secondary | ICD-10-CM

## 2022-05-31 LAB — ECHOCARDIOGRAM COMPLETE
AR max vel: 1.08 cm2
AV Area VTI: 1.18 cm2
AV Area mean vel: 1.04 cm2
AV Mean grad: 26 mmHg
AV Peak grad: 46.7 mmHg
Ao pk vel: 3.42 m/s
Area-P 1/2: 2.05 cm2
Calc EF: 74.5 %
MV M vel: 4.33 m/s
MV Peak grad: 74.9 mmHg
S' Lateral: 2.08 cm
Single Plane A2C EF: 77.6 %
Single Plane A4C EF: 69 %

## 2022-06-04 ENCOUNTER — Encounter: Payer: Self-pay | Admitting: *Deleted

## 2022-08-03 DIAGNOSIS — Z299 Encounter for prophylactic measures, unspecified: Secondary | ICD-10-CM | POA: Diagnosis not present

## 2022-08-03 DIAGNOSIS — E1159 Type 2 diabetes mellitus with other circulatory complications: Secondary | ICD-10-CM | POA: Diagnosis not present

## 2022-08-03 DIAGNOSIS — I700 Atherosclerosis of aorta: Secondary | ICD-10-CM | POA: Diagnosis not present

## 2022-08-03 DIAGNOSIS — I152 Hypertension secondary to endocrine disorders: Secondary | ICD-10-CM | POA: Diagnosis not present

## 2022-08-03 DIAGNOSIS — I10 Essential (primary) hypertension: Secondary | ICD-10-CM | POA: Diagnosis not present

## 2022-08-03 DIAGNOSIS — Z2821 Immunization not carried out because of patient refusal: Secondary | ICD-10-CM | POA: Diagnosis not present

## 2022-09-18 ENCOUNTER — Ambulatory Visit: Payer: Medicare HMO | Attending: Cardiology

## 2022-09-18 DIAGNOSIS — I6523 Occlusion and stenosis of bilateral carotid arteries: Secondary | ICD-10-CM

## 2022-09-24 ENCOUNTER — Encounter: Payer: Self-pay | Admitting: *Deleted

## 2022-10-16 ENCOUNTER — Other Ambulatory Visit: Payer: Self-pay | Admitting: Cardiology

## 2022-10-16 DIAGNOSIS — I6523 Occlusion and stenosis of bilateral carotid arteries: Secondary | ICD-10-CM

## 2022-11-15 DIAGNOSIS — I152 Hypertension secondary to endocrine disorders: Secondary | ICD-10-CM | POA: Diagnosis not present

## 2022-11-15 DIAGNOSIS — E1159 Type 2 diabetes mellitus with other circulatory complications: Secondary | ICD-10-CM | POA: Diagnosis not present

## 2022-11-15 DIAGNOSIS — Z299 Encounter for prophylactic measures, unspecified: Secondary | ICD-10-CM | POA: Diagnosis not present

## 2022-11-15 DIAGNOSIS — N1831 Chronic kidney disease, stage 3a: Secondary | ICD-10-CM | POA: Diagnosis not present

## 2022-11-15 DIAGNOSIS — I10 Essential (primary) hypertension: Secondary | ICD-10-CM | POA: Diagnosis not present

## 2023-01-01 DIAGNOSIS — H52229 Regular astigmatism, unspecified eye: Secondary | ICD-10-CM | POA: Diagnosis not present

## 2023-01-01 DIAGNOSIS — H524 Presbyopia: Secondary | ICD-10-CM | POA: Diagnosis not present

## 2023-01-01 DIAGNOSIS — H5213 Myopia, bilateral: Secondary | ICD-10-CM | POA: Diagnosis not present

## 2023-01-07 DIAGNOSIS — H25811 Combined forms of age-related cataract, right eye: Secondary | ICD-10-CM | POA: Diagnosis not present

## 2023-01-07 DIAGNOSIS — H25812 Combined forms of age-related cataract, left eye: Secondary | ICD-10-CM | POA: Diagnosis not present

## 2023-01-17 DIAGNOSIS — H25812 Combined forms of age-related cataract, left eye: Secondary | ICD-10-CM | POA: Diagnosis not present

## 2023-01-24 ENCOUNTER — Ambulatory Visit: Payer: Medicare HMO | Admitting: Vascular Surgery

## 2023-01-27 NOTE — H&P (View-Only) (Signed)
Cardiology Office Note:   Date:  01/30/2023  ID:  Tony [name], DOB 09/20/1940, MRN [id number] PCP: Ignatius Specking, MD  [facility] Providers Cardiologist:  [name], MD    History of Present Illness:   Tony [name] is a 82 y.o. male  who presents for followup after CABG in June of 2014.   He had open repair of an AAA.  Inguinal hernia was done in April 2023 and he did well with this.    Since I last saw him he has had progressive angina.  He is getting this walking up a 10% incline in his yard.  He had this for a while is a stable pattern but it has increased in frequency and intensity.  It comes on with musculus exertion.  Last for 10 to 15 minutes and goes away with time.  He has not taken a nitroglycerin.  He does not describe any new shortness of breath, PND or orthopnea.  He says he just feels heavy in his chest may be a little flushed.  He is not having any of this at rest.  He has not had any palpitations, presyncope or syncope.  He is lost about 30 pounds intentionally through diet.  ROS: As stated in the HPI and negative for all other systems.  Studies Reviewed:    EKG: Sinus rhythm, rate 72, axis within normal limits, intervals within normal limits, old anteroseptal infarct, lateral T wave inversions unchanged from previous.    Risk Assessment/Calculations:            Physical Exam:   VS:  BP 138/72   Pulse 72   Ht 5\' 11"  (1.803 m)   Wt 213 lb (96.6 kg)   BMI 29.71 kg/m    Wt Readings from Last 3 Encounters:  01/30/23 213 lb (96.6 kg)  05/30/22 215 lb (97.5 kg)  02/01/21 216 lb (98 kg)     GEN: Well nourished, well developed in no acute distress NECK: No JVD; No carotid bruits CARDIAC: RRR, 3 out of 6 apical systolic murmur radiating slightly at the aortic outflow tract, no diastolic murmurs, rubs, gallops RESPIRATORY:  Clear to auscultation without rales, wheezing or rhonchi  ABDOMEN: Soft, non-tender, non-distended EXTREMITIES:  No edema; No  deformity   ASSESSMENT AND PLAN:     CAD/CABG:   He has progressive exertional angina (unstable).  Cardiac cath is indicated. The patient understands that risks included but are not limited to stroke (1 in 1000), death (1 in 1000), kidney failure [usually temporary] (1 in 500), bleeding (1 in 200), allergic reaction [possibly serious] (1 in 200).  The patient understands and agrees to proceed.  See below regarding his renal function.  Of note he does report an aspirin allergy but actually says he has been taking aspirin and the allergy is vague.  He can continue his aspirin.  HTN:    The blood pressure is at target.Marland Kitchen  No change in therapy.    AS:   This was moderate and had increased in intensity in October of last year.  My plan was to get another echocardiogram in October of this year.  Certainly if he does not have any obstructive coronary disease he could have progressive AAS and I would measure that sooner.   CAROTID STENOSIS:   This is followed by [facility].   AAA:    He is status post repair.  No change in therapy.   HYPERLIPIDEMIA:     He is  intolerant of statins.  He could not afford PCSK9.  He had done well on PCSK9 previously and I am going to try to see if we can get him any further assistance with this.   CKD: This looks to be class IIIa at least.  His last creatinine was 1.52.  I will check the med and bring him in for early hydration if he remains stable around this level.   DM:    A1C was 6.1.  No change in therapy.         Signed, [name], MD

## 2023-01-27 NOTE — Progress Notes (Unsigned)
Cardiology Office Note:   Date:  01/30/2023  ID:  Tony [name], DOB 09/20/1940, MRN [id number] PCP: Ignatius Specking, MD  [facility] Providers Cardiologist:  [name], MD    History of Present Illness:   Tony [name] is a 82 y.o. male  who presents for followup after CABG in June of 2014.   He had open repair of an AAA.  Inguinal hernia was done in April 2023 and he did well with this.    Since I last saw him he has had progressive angina.  He is getting this walking up a 10% incline in his yard.  He had this for a while is a stable pattern but it has increased in frequency and intensity.  It comes on with musculus exertion.  Last for 10 to 15 minutes and goes away with time.  He has not taken a nitroglycerin.  He does not describe any new shortness of breath, PND or orthopnea.  He says he just feels heavy in his chest may be a little flushed.  He is not having any of this at rest.  He has not had any palpitations, presyncope or syncope.  He is lost about 30 pounds intentionally through diet.  ROS: As stated in the HPI and negative for all other systems.  Studies Reviewed:    EKG: Sinus rhythm, rate 72, axis within normal limits, intervals within normal limits, old anteroseptal infarct, lateral T wave inversions unchanged from previous.    Risk Assessment/Calculations:            Physical Exam:   VS:  BP 138/72   Pulse 72   Ht 5\' 11"  (1.803 m)   Wt 213 lb (96.6 kg)   BMI 29.71 kg/m    Wt Readings from Last 3 Encounters:  01/30/23 213 lb (96.6 kg)  05/30/22 215 lb (97.5 kg)  02/01/21 216 lb (98 kg)     GEN: Well nourished, well developed in no acute distress NECK: No JVD; No carotid bruits CARDIAC: RRR, 3 out of 6 apical systolic murmur radiating slightly at the aortic outflow tract, no diastolic murmurs, rubs, gallops RESPIRATORY:  Clear to auscultation without rales, wheezing or rhonchi  ABDOMEN: Soft, non-tender, non-distended EXTREMITIES:  No edema; No  deformity   ASSESSMENT AND PLAN:     CAD/CABG:   He has progressive exertional angina (unstable).  Cardiac cath is indicated. The patient understands that risks included but are not limited to stroke (1 in 1000), death (1 in 1000), kidney failure [usually temporary] (1 in 500), bleeding (1 in 200), allergic reaction [possibly serious] (1 in 200).  The patient understands and agrees to proceed.  See below regarding his renal function.  Of note he does report an aspirin allergy but actually says he has been taking aspirin and the allergy is vague.  He can continue his aspirin.  HTN:    The blood pressure is at target.Marland Kitchen  No change in therapy.    AS:   This was moderate and had increased in intensity in October of last year.  My plan was to get another echocardiogram in October of this year.  Certainly if he does not have any obstructive coronary disease he could have progressive AAS and I would measure that sooner.   CAROTID STENOSIS:   This is followed by [facility].   AAA:    He is status post repair.  No change in therapy.   HYPERLIPIDEMIA:     He is  intolerant of statins.  He could not afford PCSK9.  He had done well on PCSK9 previously and I am going to try to see if we can get him any further assistance with this.   CKD: This looks to be class IIIa at least.  His last creatinine was 1.52.  I will check the med and bring him in for early hydration if he remains stable around this level.   DM:    A1C was 6.1.  No change in therapy.         Signed, [name], MD

## 2023-01-30 ENCOUNTER — Encounter: Payer: Self-pay | Admitting: Cardiology

## 2023-01-30 ENCOUNTER — Ambulatory Visit (INDEPENDENT_AMBULATORY_CARE_PROVIDER_SITE_OTHER): Payer: Medicare HMO | Admitting: Cardiology

## 2023-01-30 ENCOUNTER — Other Ambulatory Visit: Payer: Self-pay | Admitting: *Deleted

## 2023-01-30 VITALS — BP 138/72 | HR 72 | Ht 71.0 in | Wt 213.0 lb

## 2023-01-30 DIAGNOSIS — E118 Type 2 diabetes mellitus with unspecified complications: Secondary | ICD-10-CM

## 2023-01-30 DIAGNOSIS — I25810 Atherosclerosis of coronary artery bypass graft(s) without angina pectoris: Secondary | ICD-10-CM | POA: Diagnosis not present

## 2023-01-30 DIAGNOSIS — Z7984 Long term (current) use of oral hypoglycemic drugs: Secondary | ICD-10-CM | POA: Diagnosis not present

## 2023-01-30 DIAGNOSIS — E785 Hyperlipidemia, unspecified: Secondary | ICD-10-CM | POA: Diagnosis not present

## 2023-01-30 DIAGNOSIS — I10 Essential (primary) hypertension: Secondary | ICD-10-CM | POA: Diagnosis not present

## 2023-01-30 DIAGNOSIS — Z01812 Encounter for preprocedural laboratory examination: Secondary | ICD-10-CM

## 2023-01-30 DIAGNOSIS — I714 Abdominal aortic aneurysm, without rupture, unspecified: Secondary | ICD-10-CM | POA: Diagnosis not present

## 2023-01-30 DIAGNOSIS — I200 Unstable angina: Secondary | ICD-10-CM | POA: Diagnosis not present

## 2023-01-30 DIAGNOSIS — I350 Nonrheumatic aortic (valve) stenosis: Secondary | ICD-10-CM

## 2023-01-30 MED ORDER — ASPIRIN 81 MG PO TBEC: 81.0000 mg | DELAYED_RELEASE_TABLET | Freq: Every day | ORAL | 3 refills | Status: AC

## 2023-01-30 NOTE — Addendum Note (Signed)
Addended by: Sharin Grave on: 01/30/2023 04:19 PM   Modules accepted: Orders

## 2023-01-30 NOTE — Patient Instructions (Signed)
Medication Instructions:  The current medical regimen is effective;  continue present plan and medications.  *If you need a refill on your cardiac medications before your next appointment, please call your pharmacy*   Lab Work: Please have blood work as instructed at your closest [employer]. (CBC, CMP and HA1c)  If you have labs (blood work) drawn today and your tests are completely normal, you will receive your results only by: MyChart Message (if you have MyChart) OR A paper copy in the mail If you have any lab test that is abnormal or we need to change your treatment, we will call you to review the results.   Testing/Procedures:   [place] [facility] DE[facility] [place] [id number] [place] [address] Dept: [phone] Loc: [phone]  FAUST DOCKSTADER  01/30/2023  You are scheduled for a Cardiac Catheterization on Monday, June 10 with Dr. Bryan Lemma.  1. Please arrive at the [facility] (Main Entrance A) at [facility]: [address] [place], [place] [address] at 6:00 AM (two hours before your procedure to ensure your preparation). Free valet parking service is available.   Special note: Every effort is made to have your procedure done on time. Please understand that emergencies sometimes delay scheduled procedures.  2. Diet: Do not eat or drink anything after midnight prior to your procedure except sips of water to take medications.  3. Labs:  as instructed  4. Medication instructions in preparation for your procedure:  Hold Glucotrol this morning.  On the morning of your procedure, take your Aspirin 81 mg and any morning medicines NOT listed above.  You may use sips of water.  5. Plan for one night stay--bring personal belongings. 6. Bring a current list of your medications and current insurance cards. 7. You MUST have a responsible person to drive you home. 8. Someone MUST be  with you the first 24 hours after you arrive home or your discharge will be delayed. 9. Please wear clothes that are easy to get on and off and wear slip-on shoes.  Thank you for allowing [place] to care for you!   -- [facility]  You have been referred to the [facility] and will be contacted to be scheduled.  Follow-Up: At [facility], you and your health needs are our priority.  As part of our continuing mission to provide you with exceptional heart care, we have created designated Provider Care Teams.  These Care Teams include your primary Cardiologist (physician) and Advanced Practice Providers (APPs -  Physician Assistants and Nurse Practitioners) who all work together to provide you with the care you need, when you need it.  We recommend signing up for the patient portal called "MyChart".  Sign up information is provided on this After Visit Summary.  MyChart is used to connect with patients for Virtual Visits (Telemedicine).  Patients are able to view lab/test results, encounter notes, upcoming appointments, etc.  Non-urgent messages can be sent to your provider as well.   To learn more about what you can do with MyChart, go to [email or web address].    Your next appointment:   Follow up with Dr Antoine Poche after your heart cath.

## 2023-01-31 ENCOUNTER — Telehealth: Payer: Self-pay | Admitting: *Deleted

## 2023-01-31 DIAGNOSIS — H25811 Combined forms of age-related cataract, right eye: Secondary | ICD-10-CM | POA: Diagnosis not present

## 2023-01-31 NOTE — Telephone Encounter (Signed)
Cardiac Catheterization scheduled at [facility] for: Monday February 04, 2023 10:30 AM Arrival time [facility] Main Entrance A at: 6 AM-pre-procedure hydration  Nothing to eat after midnight prior to procedure, clear liquids until 5 AM day of procedure.  Medication instructions: -Hold:  Glipizide-AM of procedure  Lotensin-PM prior /AM of procedure-per protocol GFR 43 -Other usual morning medications can be taken with sips of water including aspirin 81 mg.  Confirmed patient has responsible adult to drive home post procedure and be with patient first 24 hours after arriving home.  Plan to go home the same day, you will only stay overnight if medically necessary.  Reviewed procedure instructions with patient.

## 2023-02-01 DIAGNOSIS — E1122 Type 2 diabetes mellitus with diabetic chronic kidney disease: Secondary | ICD-10-CM | POA: Diagnosis not present

## 2023-02-01 DIAGNOSIS — R5383 Other fatigue: Secondary | ICD-10-CM | POA: Diagnosis not present

## 2023-02-01 DIAGNOSIS — Z79899 Other long term (current) drug therapy: Secondary | ICD-10-CM | POA: Diagnosis not present

## 2023-02-04 ENCOUNTER — Encounter (HOSPITAL_COMMUNITY)
Admission: RE | Disposition: A | Discharge status: Home / Self Care | Payer: Self-pay | Source: Home / Self Care | Attending: Cardiology

## 2023-02-04 ENCOUNTER — Ambulatory Visit (HOSPITAL_COMMUNITY)
Admission: RE | Admit: 2023-02-04 | Discharge: 2023-02-04 | Disposition: A | Discharge status: Home / Self Care | Payer: Medicare HMO | Attending: Cardiology | Admitting: Cardiology

## 2023-02-04 DIAGNOSIS — I129 Hypertensive chronic kidney disease with stage 1 through stage 4 chronic kidney disease, or unspecified chronic kidney disease: Secondary | ICD-10-CM | POA: Insufficient documentation

## 2023-02-04 DIAGNOSIS — I25118 Atherosclerotic heart disease of native coronary artery with other forms of angina pectoris: Secondary | ICD-10-CM

## 2023-02-04 DIAGNOSIS — I350 Nonrheumatic aortic (valve) stenosis: Secondary | ICD-10-CM | POA: Diagnosis not present

## 2023-02-04 DIAGNOSIS — E785 Hyperlipidemia, unspecified: Secondary | ICD-10-CM | POA: Diagnosis not present

## 2023-02-04 DIAGNOSIS — I700 Atherosclerosis of aorta: Secondary | ICD-10-CM | POA: Diagnosis not present

## 2023-02-04 DIAGNOSIS — N189 Chronic kidney disease, unspecified: Secondary | ICD-10-CM | POA: Insufficient documentation

## 2023-02-04 DIAGNOSIS — E1122 Type 2 diabetes mellitus with diabetic chronic kidney disease: Secondary | ICD-10-CM | POA: Insufficient documentation

## 2023-02-04 DIAGNOSIS — I2582 Chronic total occlusion of coronary artery: Secondary | ICD-10-CM | POA: Diagnosis not present

## 2023-02-04 DIAGNOSIS — Z951 Presence of aortocoronary bypass graft: Secondary | ICD-10-CM | POA: Diagnosis not present

## 2023-02-04 DIAGNOSIS — I25110 Atherosclerotic heart disease of native coronary artery with unstable angina pectoris: Secondary | ICD-10-CM | POA: Diagnosis not present

## 2023-02-04 DIAGNOSIS — I2584 Coronary atherosclerosis due to calcified coronary lesion: Secondary | ICD-10-CM | POA: Diagnosis not present

## 2023-02-04 DIAGNOSIS — Z7982 Long term (current) use of aspirin: Secondary | ICD-10-CM | POA: Insufficient documentation

## 2023-02-04 DIAGNOSIS — Z01810 Encounter for preprocedural cardiovascular examination: Secondary | ICD-10-CM

## 2023-02-04 DIAGNOSIS — I6529 Occlusion and stenosis of unspecified carotid artery: Secondary | ICD-10-CM | POA: Diagnosis not present

## 2023-02-04 DIAGNOSIS — I714 Abdominal aortic aneurysm, without rupture, unspecified: Secondary | ICD-10-CM | POA: Insufficient documentation

## 2023-02-04 HISTORY — PX: LEFT HEART CATH AND CORS/GRAFTS ANGIOGRAPHY: CATH118250

## 2023-02-04 LAB — CBC
HCT: 44.5 % (ref 39.0–52.0)
Hemoglobin: 14.4 g/dL (ref 13.0–17.0)
MCH: 29.4 pg (ref 26.0–34.0)
MCHC: 32.4 g/dL (ref 30.0–36.0)
MCV: 91 fL (ref 80.0–100.0)
Platelets: 149 10*3/uL — ABNORMAL LOW (ref 150–400)
RBC: 4.89 MIL/uL (ref 4.22–5.81)
RDW: 13 % (ref 11.5–15.5)
WBC: 6.4 10*3/uL (ref 4.0–10.5)
nRBC: 0 % (ref 0.0–0.2)

## 2023-02-04 LAB — HEMOGLOBIN A1C
Hgb A1c MFr Bld: 5.9 % — ABNORMAL HIGH (ref 4.8–5.6)
Mean Plasma Glucose: 122.63 mg/dL

## 2023-02-04 LAB — BASIC METABOLIC PANEL
Anion gap: 13 (ref 5–15)
BUN: 21 mg/dL (ref 8–23)
CO2: 24 mmol/L (ref 22–32)
Calcium: 9.7 mg/dL (ref 8.9–10.3)
Chloride: 101 mmol/L (ref 98–111)
Creatinine, Ser: 1.22 mg/dL (ref 0.61–1.24)
GFR, Estimated: 60 mL/min — ABNORMAL LOW (ref >60)
Glucose, Bld: 124 mg/dL — ABNORMAL HIGH (ref 70–99)
Potassium: 3.8 mmol/L (ref 3.5–5.1)
Sodium: 138 mmol/L (ref 135–145)

## 2023-02-04 LAB — GLUCOSE, CAPILLARY
Glucose-Capillary: 121 mg/dL — ABNORMAL HIGH (ref 70–99)
Glucose-Capillary: 119 mg/dL — ABNORMAL HIGH (ref 70–99)

## 2023-02-04 SURGERY — LEFT HEART CATH AND CORS/GRAFTS ANGIOGRAPHY
Anesthesia: LOCAL

## 2023-02-04 MED ORDER — IOHEXOL 350 MG/ML SOLN
INTRAVENOUS | Status: DC | PRN
  Administered 2023-02-04: 120 mL

## 2023-02-04 MED ORDER — SODIUM CHLORIDE 0.9 % IV SOLN: INTRAVENOUS | Status: DC

## 2023-02-04 MED ORDER — ACETAMINOPHEN 325 MG PO TABS: 650.0000 mg | ORAL_TABLET | ORAL | Status: DC | PRN

## 2023-02-04 MED ORDER — SODIUM CHLORIDE 0.9 % WEIGHT BASED INFUSION
3.0000 mL/kg/h | INTRAVENOUS | Status: AC
  Administered 2023-02-04: 3 mL/kg/h via INTRAVENOUS

## 2023-02-04 MED ORDER — SODIUM CHLORIDE 0.9% FLUSH: 3.0000 mL | Freq: Two times a day (BID) | INTRAVENOUS | Status: DC

## 2023-02-04 MED ORDER — SODIUM CHLORIDE 0.9 % IV SOLN: 250.0000 mL | INTRAVENOUS | Status: DC | PRN

## 2023-02-04 MED ORDER — MORPHINE SULFATE (PF) 2 MG/ML IV SOLN: 2.0000 mg | INTRAVENOUS | Status: DC | PRN

## 2023-02-04 MED ORDER — FENTANYL CITRATE (PF) 100 MCG/2ML IJ SOLN
INTRAMUSCULAR | Status: DC | PRN
  Administered 2023-02-04: 25 ug via INTRAVENOUS

## 2023-02-04 MED ORDER — SODIUM CHLORIDE 0.9 % WEIGHT BASED INFUSION
1.0000 mL/kg/h | INTRAVENOUS | Status: DC
  Administered 2023-02-04: 1 mL/kg/h via INTRAVENOUS

## 2023-02-04 MED ORDER — LABETALOL HCL 5 MG/ML IV SOLN: 10.0000 mg | INTRAVENOUS | Status: DC | PRN

## 2023-02-04 MED ORDER — MIDAZOLAM HCL 2 MG/2ML IJ SOLN
INTRAMUSCULAR | Status: DC | PRN
  Administered 2023-02-04: 1 mg via INTRAVENOUS

## 2023-02-04 MED ORDER — HEPARIN SODIUM (PORCINE) 1000 UNIT/ML IJ SOLN
INTRAMUSCULAR | Status: DC | PRN
  Administered 2023-02-04: 5000 [IU] via INTRAVENOUS

## 2023-02-04 MED ORDER — SODIUM CHLORIDE 0.9% FLUSH: 3.0000 mL | INTRAVENOUS | Status: DC | PRN

## 2023-02-04 MED ORDER — MIDAZOLAM HCL 2 MG/2ML IJ SOLN
INTRAMUSCULAR | Status: AC
  Filled 2023-02-04: qty 2

## 2023-02-04 MED ORDER — LIDOCAINE HCL (PF) 1 % IJ SOLN
INTRAMUSCULAR | Status: DC | PRN
  Administered 2023-02-04: 2 mL

## 2023-02-04 MED ORDER — VERAPAMIL HCL 2.5 MG/ML IV SOLN
INTRAVENOUS | Status: AC
  Filled 2023-02-04: qty 2

## 2023-02-04 MED ORDER — HEPARIN SODIUM (PORCINE) 1000 UNIT/ML IJ SOLN
INTRAMUSCULAR | Status: AC
  Filled 2023-02-04: qty 10

## 2023-02-04 MED ORDER — ASPIRIN 81 MG PO CHEW
81.0000 mg | CHEWABLE_TABLET | ORAL | Status: AC
  Administered 2023-02-04: 81 mg via ORAL
  Filled 2023-02-04: qty 1

## 2023-02-04 MED ORDER — HEPARIN (PORCINE) IN NACL 1000-0.9 UT/500ML-% IV SOLN
INTRAVENOUS | Status: DC | PRN
  Administered 2023-02-04 (×2): 500 mL

## 2023-02-04 MED ORDER — VERAPAMIL HCL 2.5 MG/ML IV SOLN
INTRAVENOUS | Status: DC | PRN
  Administered 2023-02-04: 10 mL via INTRA_ARTERIAL

## 2023-02-04 MED ORDER — FENTANYL CITRATE (PF) 100 MCG/2ML IJ SOLN
INTRAMUSCULAR | Status: AC
  Filled 2023-02-04: qty 2

## 2023-02-04 MED ORDER — ONDANSETRON HCL 4 MG/2ML IJ SOLN: 4.0000 mg | Freq: Four times a day (QID) | INTRAMUSCULAR | Status: DC | PRN

## 2023-02-04 MED ORDER — AMLODIPINE BESYLATE 2.5 MG PO TABS: 2.5000 mg | ORAL_TABLET | Freq: Every day | ORAL | 11 refills | Status: DC

## 2023-02-04 MED ORDER — LIDOCAINE HCL (PF) 1 % IJ SOLN
INTRAMUSCULAR | Status: AC
  Filled 2023-02-04: qty 30

## 2023-02-04 MED ORDER — ISOSORBIDE MONONITRATE ER 30 MG PO TB24: 30.0000 mg | ORAL_TABLET | Freq: Every day | ORAL | 11 refills | Status: DC

## 2023-02-04 MED ORDER — HYDRALAZINE HCL 20 MG/ML IJ SOLN: 10.0000 mg | INTRAMUSCULAR | Status: DC | PRN

## 2023-02-04 SURGICAL SUPPLY — 15 items
BAND CMPR LRG ZPHR (HEMOSTASIS) ×1
BAND ZEPHYR COMPRESS 30 LONG (HEMOSTASIS) IMPLANT
CATH EXPO 5F MPA-1 (CATHETERS) IMPLANT
CATH INFINITI 5 FR AR1 MOD (CATHETERS) IMPLANT
CATH INFINITI 5FR AL1 (CATHETERS) IMPLANT
CATH INFINITI 5FR MULTPACK ANG (CATHETERS) IMPLANT
GLIDESHEATH SLEND SS 6F .021 (SHEATH) IMPLANT
GUIDEWIRE INQWIRE 1.5J.035X260 (WIRE) IMPLANT
INQWIRE 1.5J .035X260CM (WIRE) ×1
KIT HEART LEFT (KITS) ×1 IMPLANT
PACK CARDIAC CATHETERIZATION (CUSTOM PROCEDURE TRAY) ×1 IMPLANT
SHEATH PROBE COVER 6X72 (BAG) IMPLANT
TRANSDUCER W/STOPCOCK (MISCELLANEOUS) ×1 IMPLANT
TUBING CIL FLEX 10 FLL-RA (TUBING) ×1 IMPLANT
WIRE EMERALD ST .035X260CM (WIRE) IMPLANT

## 2023-02-04 NOTE — Progress Notes (Signed)
TR BAND REMOVAL  LOCATION:    left radial  DEFLATED PER PROTOCOL:    Yes.    TIME BAND OFF / DRESSING APPLIED:    1300 gauze dressing applied    SITE UPON ARRIVAL:    Level 0  SITE AFTER BAND REMOVAL:    Level 0  CIRCULATION SENSATION AND MOVEMENT:    Within Normal Limits   Yes.    COMMENTS:   no issues noted

## 2023-02-04 NOTE — Interval H&P Note (Signed)
History and Physical Interval Note:  02/04/2023 9:22 AM  Tony [name]  has presented today for surgery, with the diagnosis of unstable angina.  The various methods of treatment have been discussed with the patient and family. After consideration of risks, benefits and other options for treatment, the patient has consented to  Procedure(s): LEFT HEART CATH AND CORS/GRAFTS ANGIOGRAPHY (N/A)  PERCUTANEOUS CORONARY INTERVENTION  as a surgical intervention.     The patient's history has been reviewed, patient examined, no change in status, stable for surgery.  I have reviewed the patient's chart and labs.  Questions were answered to the patient's satisfaction.    Cath Lab Visit (complete for each Cath Lab visit)  Clinical Evaluation Leading to the Procedure:   ACS: No.  Non-ACS:    Anginal Classification: CCS III  Anti-ischemic medical therapy: Minimal Therapy (1 class of medications)  Non-Invasive Test Results: No non-invasive testing performed  Prior CABG: Previous CABG    Bryan Lemma

## 2023-02-04 NOTE — Discharge Instructions (Signed)

## 2023-02-05 ENCOUNTER — Encounter (HOSPITAL_COMMUNITY): Payer: Self-pay | Admitting: Cardiology

## 2023-02-07 DIAGNOSIS — Z299 Encounter for prophylactic measures, unspecified: Secondary | ICD-10-CM | POA: Diagnosis not present

## 2023-02-07 DIAGNOSIS — I350 Nonrheumatic aortic (valve) stenosis: Secondary | ICD-10-CM | POA: Diagnosis not present

## 2023-02-07 DIAGNOSIS — I10 Essential (primary) hypertension: Secondary | ICD-10-CM | POA: Diagnosis not present

## 2023-02-27 DIAGNOSIS — Z6830 Body mass index (BMI) 30.0-30.9, adult: Secondary | ICD-10-CM | POA: Diagnosis not present

## 2023-02-27 DIAGNOSIS — E7800 Pure hypercholesterolemia, unspecified: Secondary | ICD-10-CM | POA: Diagnosis not present

## 2023-02-27 DIAGNOSIS — Z299 Encounter for prophylactic measures, unspecified: Secondary | ICD-10-CM | POA: Diagnosis not present

## 2023-02-27 DIAGNOSIS — I700 Atherosclerosis of aorta: Secondary | ICD-10-CM | POA: Diagnosis not present

## 2023-02-27 DIAGNOSIS — Z87891 Personal history of nicotine dependence: Secondary | ICD-10-CM | POA: Diagnosis not present

## 2023-02-27 DIAGNOSIS — R5383 Other fatigue: Secondary | ICD-10-CM | POA: Diagnosis not present

## 2023-02-27 DIAGNOSIS — Z7189 Other specified counseling: Secondary | ICD-10-CM | POA: Diagnosis not present

## 2023-02-27 DIAGNOSIS — I10 Essential (primary) hypertension: Secondary | ICD-10-CM | POA: Diagnosis not present

## 2023-02-27 DIAGNOSIS — Z1331 Encounter for screening for depression: Secondary | ICD-10-CM | POA: Diagnosis not present

## 2023-02-27 DIAGNOSIS — N1831 Chronic kidney disease, stage 3a: Secondary | ICD-10-CM | POA: Diagnosis not present

## 2023-02-27 DIAGNOSIS — D696 Thrombocytopenia, unspecified: Secondary | ICD-10-CM | POA: Diagnosis not present

## 2023-02-27 DIAGNOSIS — Z0000 Encounter for general adult medical examination without abnormal findings: Secondary | ICD-10-CM | POA: Diagnosis not present

## 2023-02-27 DIAGNOSIS — Z1339 Encounter for screening examination for other mental health and behavioral disorders: Secondary | ICD-10-CM | POA: Diagnosis not present

## 2023-02-27 DIAGNOSIS — Z125 Encounter for screening for malignant neoplasm of prostate: Secondary | ICD-10-CM | POA: Diagnosis not present

## 2023-02-27 DIAGNOSIS — Z0100 Encounter for examination of eyes and vision without abnormal findings: Secondary | ICD-10-CM | POA: Diagnosis not present

## 2023-03-14 ENCOUNTER — Encounter: Payer: Self-pay | Admitting: Vascular Surgery

## 2023-03-14 ENCOUNTER — Ambulatory Visit: Payer: Medicare HMO | Admitting: Vascular Surgery

## 2023-03-14 VITALS — BP 182/75 | HR 74 | Temp 98.4°F | Resp 18 | Ht 69.0 in | Wt 216.9 lb

## 2023-03-14 DIAGNOSIS — Z48812 Encounter for surgical aftercare following surgery on the circulatory system: Secondary | ICD-10-CM

## 2023-03-14 DIAGNOSIS — I714 Abdominal aortic aneurysm, without rupture, unspecified: Secondary | ICD-10-CM | POA: Diagnosis not present

## 2023-03-14 NOTE — Progress Notes (Signed)
REASON FOR VISIT:   Follow-up after open repair of abdominal aortic aneurysm.  MEDICAL ISSUES:   S/P OPEN REPAIR ABDOMINAL AORTIC ANEURYSM: The patient is doing well status post open repair of his abdominal aortic aneurysm.  He is not a smoker.  He is on aspirin.  He does not tolerate statins.  He has a ventral hernia which is asymptomatic.  He does not need routine follow-up of his aneurysm.  PERIPHERAL ARTERIAL DISEASE: The patient does have evidence of infrainguinal arterial occlusive disease bilaterally.  He has stable claudication.  I would not recommend further workup unless he developed rest pain or nonhealing ulcer.  He is scheduled to have a carotid duplex scan at Dr. Jenene Slicker office next month.  I will see if they can do ABIs at that time and follow his peripheral arterial disease to try to save him an office visit here every year.  He would appreciate that if with this could be arranged.  Certainly if his peripheral arterial disease progresses would be happy to see him back at any time.   HPI:   Tony [name] is a pleasant 82 y.o. male who I have been following with an aneurysm.  This enlarged significantly and he was not a candidate for endovascular repair.  On 02/09/2020 he underwent aortic endarterectomy and repair of an infrarenal abdominal aortic aneurysm with an aortobiiliac bypass graft.  When I saw him last in May 2022 he was doing well.  He had some infrainguinal arterial occlusive disease.  He comes in for a 2-year follow-up visit to follow his peripheral arterial disease.  He requested that we do not do ABIs as he wanted this to be done closer to home.  Since I saw him last, he is lost significant weight as he has changed his diet and is no longer eating much red meat.  He is eating lots of vegetables.  He remains active.  He does describe bilateral calf claudication at about 200 feet.  The symptoms have been stable.  The symptoms are equal on both sides.  He denies any  history of rest pain or nonhealing ulcers.  He is not a smoker.  He is on aspirin.  He does not tolerate statins.   Past Medical History:  Diagnosis Date   Aortic valve stenosis, mild    last echo in epic 03-22-2020, valve area 2.01 cm^2   Arthritis    Bilateral hydrocele    CKD (chronic kidney disease), stage III (HCC)    Claudication of left lower extremity (HCC)    per pt when walks, go up stairs   Coronary artery disease cardiologist--- dr hochrein   s/p  severe 3V CAD,  CABG x3 02-09-2013; s/p AAA and bi-iliac bypass graft 02-09-2020   Heart murmur    as a child   History of gout    09-07-2020  per pt has been several yrs, effects right great toe and trigger by asa   Hyperlipidemia    Hypertension    Occlusion and stenosis of bilateral carotid arteries    per last duplex in epic 05-05-2020 right ICA 40-59%, total occlusion left ICA   PAD (peripheral artery disease) (HCC)    known bilateral infrainguinal artial occlusive disease   PVD (peripheral vascular disease) (HCC)    vascular --- dr Edilia Bo---  s/p  AAA repair and aorta bi-iliac bypass graft   S/P AAA (abdominal aortic aneurysm) repair 02/09/2020   S/P CABG x 3 02/09/2013   LIMA -  LAD,  SVG -- RCA,  seqSVG -- OM1 and distal CFx   Type 2 diabetes mellitus (HCC)    followed by pcp---- (09-07-2020 per pt checks blood sugar in am dialy,  fasting sugar--- 118--130)   Wears glasses     Family History  Problem Relation Age of Onset   Heart failure Mother 59   Cancer Sister     SOCIAL HISTORY: Social History   Tobacco Use   Smoking status: Former    Current packs/day: 0.00    Average packs/day: 3.0 packs/day for 40.0 years (120.0 ttl pk-yrs)    Types: Cigarettes    Start date: 05/27/1952    Quit date: 05/27/1992    Years since quitting: 30.8   Smokeless tobacco: Never  Substance Use Topics   Alcohol use: Not Currently    Comment: QUIT 1993    Allergies  Allergen Reactions   Aspirin Other (See Comments)     Causes gout flare   Cortisone Nausea And Vomiting   Statins Other (See Comments)    Pt states severe muscle pains.    Current Outpatient Medications  Medication Sig Dispense Refill   amLODipine (NORVASC) 2.5 MG tablet Take 1 tablet (2.5 mg total) by mouth daily. 30 tablet 11   aspirin EC 81 MG tablet Take 1 tablet (81 mg total) by mouth daily. Swallow whole. (Patient taking differently: Take 81 mg by mouth in the morning and at bedtime. Swallow whole.) 90 tablet 3   benazepril (LOTENSIN) 20 MG tablet Take 20 mg by mouth 2 (two) times daily.     glipiZIDE (GLUCOTROL) 5 MG tablet Take 5 mg by mouth 2 (two) times daily.     ibuprofen (ADVIL) 200 MG tablet Take 400 mg by mouth every 8 (eight) hours as needed (pain.).     isosorbide mononitrate (IMDUR) 30 MG 24 hr tablet Take 1 tablet (30 mg total) by mouth daily. 30 tablet 11   ketorolac (ACULAR) 0.5 % ophthalmic solution Place 1 drop into the right eye in the morning and at bedtime.     tiZANidine (ZANAFLEX) 4 MG tablet Take 4 mg by mouth every 8 (eight) hours as needed for muscle spasms.     ofloxacin (OCUFLOX) 0.3 % ophthalmic solution Place 1 drop into the right eye See admin instructions. Instill 1 drop into right eye 3 times daily x 1 week, instill 1 drop into right eye 2 times daily x 1 week, then instill 1 drop into right eye once daily for 14 days (beginning on 02/01/23) (Patient not taking: Reported on 03/14/2023)     Polyethyl Glycol-Propyl Glycol (SYSTANE) 0.4-0.3 % SOLN Place 1 drop into both eyes 3 (three) times daily as needed. (Patient not taking: Reported on 03/14/2023)     prednisoLONE acetate (PRED FORTE) 1 % ophthalmic suspension Place 1 drop into the right eye as directed. Instill 1 drop into right eye 3 times daily x 1 week, instill 1 drop into right eye 2 times daily x 1 week, then instill 1 drop into right eye once daily for 14 days (beginning on 02/01/23) (Patient not taking: Reported on 03/14/2023)     No current  facility-administered medications for this visit.    REVIEW OF SYSTEMS:  [X]  denotes positive finding, [ ]  denotes negative finding Cardiac  Comments:  Chest pain or chest pressure:    Shortness of breath upon exertion:    Short of breath when lying flat:    Irregular heart rhythm:  Vascular    Pain in calf, thigh, or hip brought on by ambulation: x   Pain in feet at night that wakes you up from your sleep:     Blood clot in your veins:    Leg swelling:         Pulmonary    Oxygen at home:    Productive cough:     Wheezing:         Neurologic    Sudden weakness in arms or legs:     Sudden numbness in arms or legs:     Sudden onset of difficulty speaking or slurred speech:    Temporary loss of vision in one eye:     Problems with dizziness:         Gastrointestinal    Blood in stool:     Vomited blood:         Genitourinary    Burning when urinating:     Blood in urine:        Psychiatric    Major depression:         Hematologic    Bleeding problems:    Problems with blood clotting too easily:        Skin    Rashes or ulcers:        Constitutional    Fever or chills:     PHYSICAL EXAM:   Vitals:   03/14/23 1003  BP: (!) 182/75  Pulse: 74  Resp: 18  Temp: 98.4 F (36.9 C)  TempSrc: Temporal  SpO2: 97%  Weight: 216 lb 14.4 oz (98.4 kg)  Height: 5\' 9"  (1.753 m)    GENERAL: The patient is a well-nourished male, in no acute distress. The vital signs are documented above. CARDIAC: There is a regular rate and rhythm.  He has a systolic murmur. VASCULAR: I do not detect carotid bruits. He has palpable femoral pulses.  I cannot palpate pedal pulses. He has no significant lower extremity swelling. PULMONARY: There is good air exchange bilaterally without wheezing or rales. ABDOMEN: Soft and non-tender with normal pitched bowel sounds.  He has a ventral hernia. MUSCULOSKELETAL: There are no major deformities or cyanosis. NEUROLOGIC: No focal weakness  or paresthesias are detected. SKIN: There are no ulcers or rashes noted. PSYCHIATRIC: The patient has a normal affect.  DATA:    No new studies.  [name] [phone]

## 2023-03-15 ENCOUNTER — Telehealth: Payer: Self-pay | Admitting: *Deleted

## 2023-03-15 DIAGNOSIS — I739 Peripheral vascular disease, unspecified: Secondary | ICD-10-CM

## 2023-03-15 NOTE — Telephone Encounter (Signed)
Left message for pt to call.

## 2023-03-15 NOTE — Telephone Encounter (Signed)
Patient returned RN's call. 

## 2023-03-15 NOTE — Telephone Encounter (Signed)
-----   Message from [name] sent at 03/15/2023 11:33 AM EDT ----- Regarding: FW: ABI's [name],  Can you please add these.  Thanks. ----- Message ----- From: Chuck Hint, MD Sent: 03/14/2023  10:28 AM EDT To: [name], MD Subject: Tony [name], This patient is scheduled to have a carotid duplex scan in your office next month.  Could you potentially add on ABIs.  He has PAD with stable claudication. He would like you do his routine follow-up studies there to save him office visits here. He has a long drive.  We can see him back as needed.  Thanks. Thayer Ohm

## 2023-03-18 NOTE — Telephone Encounter (Signed)
Patient is returning call and is requesting call back.  

## 2023-03-18 NOTE — Telephone Encounter (Signed)
Spoke to the patient, he is returning [place], Charity fundraiser phone call.

## 2023-03-18 NOTE — Telephone Encounter (Signed)
Spoke with pt, aware appointment time has changed to 7:30 am for both dopplers to be completed.

## 2023-04-22 DIAGNOSIS — I10 Essential (primary) hypertension: Secondary | ICD-10-CM | POA: Diagnosis not present

## 2023-04-22 DIAGNOSIS — I25119 Atherosclerotic heart disease of native coronary artery with unspecified angina pectoris: Secondary | ICD-10-CM | POA: Diagnosis not present

## 2023-04-22 DIAGNOSIS — Z0000 Encounter for general adult medical examination without abnormal findings: Secondary | ICD-10-CM | POA: Diagnosis not present

## 2023-04-22 DIAGNOSIS — I700 Atherosclerosis of aorta: Secondary | ICD-10-CM | POA: Diagnosis not present

## 2023-04-22 DIAGNOSIS — Z299 Encounter for prophylactic measures, unspecified: Secondary | ICD-10-CM | POA: Diagnosis not present

## 2023-05-01 ENCOUNTER — Other Ambulatory Visit: Payer: Self-pay

## 2023-05-01 ENCOUNTER — Ambulatory Visit (INDEPENDENT_AMBULATORY_CARE_PROVIDER_SITE_OTHER): Payer: Medicare HMO

## 2023-05-01 ENCOUNTER — Ambulatory Visit: Payer: Medicare HMO | Attending: Cardiology

## 2023-05-01 DIAGNOSIS — I739 Peripheral vascular disease, unspecified: Secondary | ICD-10-CM | POA: Diagnosis not present

## 2023-05-01 DIAGNOSIS — I6523 Occlusion and stenosis of bilateral carotid arteries: Secondary | ICD-10-CM | POA: Diagnosis not present

## 2023-05-01 LAB — VAS US ABI WITH/WO TBI
Left ABI: 0.52
Right ABI: 0.84

## 2023-05-05 NOTE — Progress Notes (Unsigned)
Cardiology Office Note:   Date:  05/05/2023  ID:  Tony [name], DOB [birth year], MRN [id number] PCP: Ignatius Specking, MD  [facility] Providers Cardiologist:  [name], MD {  History of Present Illness:   Tony [name] is a 82 y.o. male who presents for followup after CABG in June of 2014.  He had progressive angina and I sent him for a cardiac cath.  He was found to have diffuse disease with a patent LIMA to the LAD.  He had an occluded SVT to the circ and occluded SVG to the RCA.  ***     *** He had open repair of an AAA.  Inguinal hernia was done in April and he did well with this.  He has had some exertional chest discomfort in the past year.  He says he walks about 15 degree incline about 125 feet and he might have some burning discomfort but this has been stable over the years.  He denies any resting symptoms.  He does not think the symptoms are getting worse.  He is not having any new shortness of breath, PND or orthopnea.  Is not had any new palpitations, presyncope or syncope.  He has had no weight gain or edema.    eft Main  Ost LM to [facility] LAD lesion is 70% stenosed with 60% stenosed side branch in Ost Cx. The lesion is located at the bifurcation and eccentric. The lesion is calcified.    Left Anterior Descending  Ost LAD to Prox LAD lesion is 99% stenosed. The lesion is severely calcified.  Prox LAD to Mid LAD lesion is 100% stenosed. The lesion is chronically occluded.    First Diagonal Branch  Vessel is small in size.    Second Diagonal Branch  Vessel is small in size.    Left Circumflex  Vessel is large.    First Obtuse Marginal Branch  Vessel is small in size.  1st Mrg lesion is 100% stenosed.    Second Obtuse Marginal Branch  Vessel is large in size.  2nd Mrg lesion is 100% stenosed.    Left Posterior Atrioventricular Artery  Vessel is large in size.    Right Coronary Artery  Vessel was not visualized due to known occlusion.  Ost RCA to Prox  RCA lesion is 100% stenosed. The lesion is severely calcified.    Saphenous Graft To RPDA  SVG graft was visualized by angiography and is normal in caliber.  Origin lesion is 100% stenosed.    Sequential Jump Graft Graft To 2nd Mrg, 1st LPL  Seq SVG- OM-dLCx graft was visualized by angiography and is normal in caliber.  Prox Graft to Dist Graft lesion between 2nd Mrg and 1st LPL is 100% stenosed.    LIMA LIMA Graft To Mid LAD  LIMA graft was visualized by angiography and is normal in caliber. The graft exhibits no disease.       ROS: ***  Studies Reviewed:    EKG:       ***  Risk Assessment/Calculations:   {Does this patient have ATRIAL FIBRILLATION?:[phone]} No BP recorded.  {Refresh Note OR Click here to enter BP  :1}***        Physical Exam:   VS:  There were no vitals taken for this visit.   Wt Readings from Last 3 Encounters:  03/14/23 216 lb 14.4 oz (98.4 kg)  02/04/23 208 lb (94.3 kg)  01/30/23 213 lb (96.6 kg)     GEN: Well  nourished, well developed in no acute distress NECK: No JVD; No carotid bruits CARDIAC: ***RRR, no murmurs, rubs, gallops RESPIRATORY:  Clear to auscultation without rales, wheezing or rhonchi  ABDOMEN: Soft, non-tender, non-distended EXTREMITIES:  No edema; No deformity   ASSESSMENT AND PLAN:   CAD/CABG:   ***   he patient has no new sypmtoms.  No further cardiovascular testing is indicated.  We will continue with aggressive risk reduction and meds as listed.   HTN:    The blood pressure is ***  at target.  No change in therapy.   AS:   This was moderately severe in October 2023.  ***  mild in 2021.  We will repeat an echocardiogram.   CAROTID STENOSIS:   ***   He is followed by [facility].    AAA:    He is status post repair.   ***     HYPERLIPIDEMIA:     He is intolerant of statins.  He could not afford PCSK9. ***   He is taking fenofibrate.  I am going to add Zetia 10 mg daily.  I sent a message to see if he would qualify for any  clinical trials.   DM:    A1C was ***  5.9.  No change in therapy.     {Are you ordering a CV Procedure (e.g. stress test, cath, DCCV, [name], etc)?   Press F2        :[id number]}  Follow up ***  Signed, [name], MD

## 2023-05-06 ENCOUNTER — Other Ambulatory Visit: Payer: Self-pay | Admitting: *Deleted

## 2023-05-06 DIAGNOSIS — I6523 Occlusion and stenosis of bilateral carotid arteries: Secondary | ICD-10-CM

## 2023-05-08 ENCOUNTER — Ambulatory Visit (INDEPENDENT_AMBULATORY_CARE_PROVIDER_SITE_OTHER): Payer: Medicare HMO | Admitting: Cardiology

## 2023-05-08 ENCOUNTER — Encounter: Payer: Self-pay | Admitting: Cardiology

## 2023-05-08 VITALS — BP 146/80 | HR 68 | Ht 71.0 in | Wt 216.0 lb

## 2023-05-08 DIAGNOSIS — I25700 Atherosclerosis of coronary artery bypass graft(s), unspecified, with unstable angina pectoris: Secondary | ICD-10-CM | POA: Diagnosis not present

## 2023-05-08 DIAGNOSIS — E785 Hyperlipidemia, unspecified: Secondary | ICD-10-CM | POA: Diagnosis not present

## 2023-05-08 DIAGNOSIS — I350 Nonrheumatic aortic (valve) stenosis: Secondary | ICD-10-CM | POA: Diagnosis not present

## 2023-05-08 DIAGNOSIS — Z789 Other specified health status: Secondary | ICD-10-CM

## 2023-05-08 DIAGNOSIS — I10 Essential (primary) hypertension: Secondary | ICD-10-CM | POA: Diagnosis not present

## 2023-05-08 MED ORDER — NITROGLYCERIN 0.4 MG SL SUBL: 0.4000 mg | SUBLINGUAL_TABLET | SUBLINGUAL | 99 refills | Status: AC | PRN

## 2023-05-08 NOTE — Patient Instructions (Signed)
Medication Instructions:  The current medical regimen is effective;  continue present plan and medications.  *If you need a refill on your cardiac medications before your next appointment, please call your pharmacy*  Testing/Procedures: Your physician has requested that you have an echocardiogram. Echocardiography is a painless test that uses sound waves to create images of your heart. It provides your doctor with information about the size and shape of your heart and how well your heart's chambers and valves are working. This procedure takes approximately one hour. There are no restrictions for this procedure. Please do NOT wear cologne, perfume, aftershave, or lotions (deodorant is allowed). Please arrive 15 minutes prior to your appointment time. You will be contacted to be scheduled for this testing in the [place] office.  This is due in November.  You have been referred to our Lipid Clinic at the [facility] office and will be contacted to be scheduled.  Follow-Up: At [facility], you and your health needs are our priority.  As part of our continuing mission to provide you with exceptional heart care, we have created designated Provider Care Teams.  These Care Teams include your primary Cardiologist (physician) and Advanced Practice Providers (APPs -  Physician Assistants and Nurse Practitioners) who all work together to provide you with the care you need, when you need it.  We recommend signing up for the patient portal called "MyChart".  Sign up information is provided on this After Visit Summary.  MyChart is used to connect with patients for Virtual Visits (Telemedicine).  Patients are able to view lab/test results, encounter notes, upcoming appointments, etc.  Non-urgent messages can be sent to your provider as well.   To learn more about what you can do with MyChart, go to [email or web address].    Your next appointment:   1 year(s)  Provider:   [name], MD

## 2023-05-16 ENCOUNTER — Ambulatory Visit
Payer: Medicare HMO | Attending: Cardiovascular Disease | Admitting: Pharmacist Clinician (PhC)/ Clinical Pharmacy Specialist

## 2023-05-16 ENCOUNTER — Other Ambulatory Visit (HOSPITAL_COMMUNITY): Payer: Self-pay

## 2023-05-16 ENCOUNTER — Telehealth: Payer: Self-pay | Admitting: Pharmacist Clinician (PhC)/ Clinical Pharmacy Specialist

## 2023-05-16 ENCOUNTER — Telehealth: Payer: Self-pay

## 2023-05-16 DIAGNOSIS — E785 Hyperlipidemia, unspecified: Secondary | ICD-10-CM

## 2023-05-16 MED ORDER — ROSUVASTATIN CALCIUM 10 MG PO TABS: ORAL_TABLET | ORAL | 3 refills | Status: DC

## 2023-05-16 NOTE — Telephone Encounter (Signed)
Pharmacy Patient Advocate Encounter   Received notification from Physician's Office that prior authorization for REPATHA is required/requested.   Insurance verification completed.   The patient is insured through [facility] .   Per test claim: PA required; PA submitted to [facility] via CoverMyMeds Key/confirmation #/EOC [id number] Status is pending

## 2023-05-16 NOTE — Telephone Encounter (Signed)
Please do PA for Repatha

## 2023-05-16 NOTE — Assessment & Plan Note (Signed)
Assessment: Patient with ASCVD not at LDL goal of < 55 Most recent LDL 160 on 02/2023 Not able to tolerate statins secondary to myalgias - atorvastatin and simvastatin Reviewed options for lowering LDL cholesterol, including statin re-challenge, ezetimibe, PCSK-9 inhibitors, bempedoic acid and inclisiran.  Discussed mechanisms of action, dosing, side effects, potential decreases in LDL cholesterol and costs.  Also reviewed potential options for patient assistance.  Plan: Patient agreeable to starting rosuvastatin 10 mg twice weekly for 4 weeks then increasing to three times weekly if tolerated.   Will consider adding on ezetimibe after stable on statin.  Realistically need to be back on PCSK9, but until we can get [facility] for him, this won't be possible.   Repeat labs after:  3 months Lipid Liver function

## 2023-05-16 NOTE — Telephone Encounter (Signed)
Pharmacy Patient Advocate Encounter  Received notification from [facility] that Prior Authorization for REPATHA has been APPROVED from 05/16/23 to 08/27/23. Ran test claim, Copay is $47. This test claim was processed through [facility]- copay amounts may vary at other pharmacies due to pharmacy/plan contracts, or as the patient moves through the different stages of their insurance plan.

## 2023-05-16 NOTE — Telephone Encounter (Signed)
PA approved, but patient currently cannot afford copay.  Will hold until are able to get [employer].

## 2023-05-16 NOTE — Patient Instructions (Addendum)
Your Results:             Your most recent labs Goal  Total Cholesterol 262 < 200  Triglycerides 321 < 150  HDL (happy/good cholesterol) 42 > 40  LDL (lousy/bad cholesterol 160 < 55   Medication changes:  Start rosuvastatin 10 mg twice weekly.  After 4 weeks if you are tolerating this, try increasing to three times weekly.    We will add you to the list to get a [employer] grant.  Once we get that we can restart the Repatha injections.  I will reach out to you once this happens  Lab orders:  We want to repeat labs after 2-3 months.  We will send you a lab order to remind you once we get closer to that time.    Patient Assistance:    We will sign you up for a [employer] Grant once your medication is approved by [employer].  I will call you with the ID number, then you will take this information to the pharmacy.  They will bill it after your insurance, bringing your copay to $0.  The grant will pay the first $2,500 in a one year period.    ID   BIN [id number]  PCN [id number]  GRP [id number]    Thank you for choosing [facility]

## 2023-05-16 NOTE — Progress Notes (Unsigned)
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    History of Present Illness    Tony [name] is a 82 y.o. male patient of Dr Antoine Poche, in the office today to discuss options for cholesterol management.  He has previously been intolerant to statin drugs and did well with PCSK9i, however had to discontinue because of cost concerns.  His LDL did drop to 51 when on PCSK9 inhibitor back in 2021.    Today he tells me that about 18 months ago he put himself on a diet  - cut out red meat and fatty foods, moved toward mostly vegetables and fish or white meat chicken.  Also eats beans for protein.  Has dropped about 30 pounds and has been keeping weight consistent at 200 +/- since then.   He previously had myalgias with simvastatin and atorvastatin, but willing now to re-challenge.    Insurance Carrier:  [employer]   Repatha $47/month; $139 in coverage gap   LDL Cholesterol goal:  LDL < 55  Current Medications:  none   Previously tried:  simvastatin, atorvastatin - myalgias; ezetimibe - unknown ; PCSK9i - cost prohibitive  Family Hx: no strong history that he is aware of   Social Hx: Tobacco: quit in 93 Alcohol:   quit in 94  Diet:  white meat chicken, fish or beans for protein; lots of vegetables - garden fresh when able and home canned throughout the rest of the year.      Accessory Clinical Findings   In  [facility] - 02/2023;  TC 262, TG 321, HDL 42, LDL 160   No results found for: "LIPOA"  Lab Results  Component Value Date   ALT 15 02/10/2020   AST 22 02/10/2020   ALKPHOS 39 02/10/2020   BILITOT 0.6 02/10/2020   Lab Results  Component Value Date   CREATININE 1.22 02/04/2023   BUN 21 02/04/2023   NA 138 02/04/2023   K 3.8 02/04/2023   CL 101 02/04/2023   CO2 24 02/04/2023   Lab Results  Component Value Date   HGBA1C 5.9 (H) 02/04/2023    Home Medications    Current Outpatient Medications  Medication Sig Dispense Refill   amLODipine (NORVASC) 2.5 MG tablet Take 1 tablet (2.5 mg total) by mouth daily. 30 tablet 11   aspirin EC 81 MG tablet Take 1 tablet (81 mg total) by mouth daily. Swallow whole. 90 tablet 3   benazepril (LOTENSIN) 20 MG tablet Take 20 mg by mouth 2 (two) times daily.     glipiZIDE (GLUCOTROL) 5 MG tablet Take 5 mg by mouth 2 (two) times daily.     ibuprofen (ADVIL) 200 MG tablet Take 400 mg by mouth every 8 (eight) hours as needed (pain.).     isosorbide mononitrate (IMDUR) 30 MG 24 hr  tablet Take 1 tablet (30 mg total) by mouth daily. 30 tablet 11   nitroGLYCERIN (NITROSTAT) 0.4 MG SL tablet Place 1 tablet (0.4 mg total) under the tongue every 5 (five) minutes as needed for chest pain. 25 tablet PRN   No current facility-administered medications for this visit.     Assessment & Plan    Dyslipidemia Assessment: Patient with ASCVD not at LDL goal of < 55 Most recent LDL 160 on 02/2023 Not able to tolerate statins secondary to myalgias - atorvastatin and simvastatin Reviewed options for lowering LDL cholesterol, including statin re-challenge, ezetimibe, PCSK-9 inhibitors, bempedoic acid and inclisiran.  Discussed mechanisms of action, dosing, side effects, potential decreases in LDL cholesterol and costs.  Also reviewed potential options for patient assistance.  Plan: Patient agreeable to starting rosuvastatin 10 mg twice weekly for 4 weeks then increasing to  three times weekly if tolerated.   Will consider adding on ezetimibe after stable on statin.  Realistically need to be back on PCSK9, but until we can get [facility] for him, this won't be possible.   Repeat labs after:  3 months Lipid Liver function    Phillips Hay, PharmD CPP [facility] [address]  [place], [place] [address] [phone]  05/16/2023, 1:23 PM

## 2023-05-16 NOTE — Telephone Encounter (Signed)
PA request has been Submitted. New Encounter created for follow up. For additional info see Pharmacy Prior Auth telephone encounter from 05/16/23.

## 2023-05-21 ENCOUNTER — Encounter: Payer: Self-pay | Admitting: Pharmacist Clinician (PhC)/ Clinical Pharmacy Specialist

## 2023-05-21 ENCOUNTER — Other Ambulatory Visit (HOSPITAL_BASED_OUTPATIENT_CLINIC_OR_DEPARTMENT_OTHER): Payer: Self-pay | Admitting: Pharmacist Clinician (PhC)/ Clinical Pharmacy Specialist

## 2023-05-21 MED ORDER — REPATHA SURECLICK 140 MG/ML ~~LOC~~ SOAJ: 140.0000 mg | SUBCUTANEOUS | 3 refills | Status: DC

## 2023-05-21 NOTE — Telephone Encounter (Signed)
Message sent Pharmacist for Repatha prescription.

## 2023-05-21 NOTE — Telephone Encounter (Signed)
Patient states that he currently has this approved with Lupita Shutter and now needs this sent to the pharmacy.   [id number] [place] [place] - [address]

## 2023-05-23 ENCOUNTER — Telehealth: Payer: Self-pay | Admitting: Pharmacist Clinician (PhC)/ Clinical Pharmacy Specialist

## 2023-05-23 NOTE — Telephone Encounter (Signed)
Patient called and mentioned that he has a question to ask regarding a medication

## 2023-05-24 NOTE — Telephone Encounter (Signed)
Returned call.  Patient had been concerned about $141 price for Repatha.  Didn't realize at first that it was for 3 month supply.  He switched to monthly with pharmacy and is happy with the price.

## 2023-05-27 ENCOUNTER — Ambulatory Visit: Payer: Medicare HMO | Attending: Cardiology

## 2023-05-27 DIAGNOSIS — I350 Nonrheumatic aortic (valve) stenosis: Secondary | ICD-10-CM | POA: Diagnosis not present

## 2023-05-28 LAB — ECHOCARDIOGRAM COMPLETE
AR max vel: 0.87 cm2
AV Area VTI: 0.98 cm2
AV Area mean vel: 0.98 cm2
AV Mean grad: 42 mm[Hg]
AV Peak grad: 65.6 mm[Hg]
Ao pk vel: 4.05 m/s
Area-P 1/2: 2.78 cm2
Calc EF: 63.1 %
MV VTI: 2.48 cm2
S' Lateral: 2.2 cm
Single Plane A2C EF: 62.5 %
Single Plane A4C EF: 59.9 %

## 2023-05-29 ENCOUNTER — Telehealth: Payer: Self-pay | Admitting: Cardiology

## 2023-05-29 NOTE — Telephone Encounter (Signed)
Left message for patient that he will be contacted once read by the provider.

## 2023-05-29 NOTE — Telephone Encounter (Signed)
Patient wants a call back to discuss echocardiogram test results.

## 2023-06-17 ENCOUNTER — Other Ambulatory Visit: Payer: Self-pay | Admitting: Cardiology

## 2023-06-17 DIAGNOSIS — I739 Peripheral vascular disease, unspecified: Secondary | ICD-10-CM

## 2023-07-02 NOTE — Progress Notes (Unsigned)
Cardiology Office Note:   Date:  07/04/2023  ID:  Tony [name], DOB July 22, 1941, MRN [id number] PCP: Ignatius Specking, MD  [facility] Providers Cardiologist:  [name], MD {  History of Present Illness:   Tony [name] is a 82 y.o. male  who presents for followup after CABG in June of 2014.  He had progressive angina and I sent him for a cardiac cath earlier this year.  He was found to have diffuse disease with a patent LIMA to the LAD.  He had an occluded SVG to the circ and occluded SVG to the RCA.  I sent him for a repeat echo in Sept 2024 and his mean gradient was increased from 26 to 42 mmHg.   DVI was 0.28.    He returns for follow-up.  He has been getting some chest discomfort walking up a slight incline in his yard.  He gets his burning discomfort and is now more short of breath.  He is having less exercise tolerance.     ROS: As stated in the HPI and negative for all other systems.  Studies Reviewed:    EKG:   NA  Risk Assessment/Calculations:              Physical Exam:   VS:  BP 120/62 (BP Location: Left Arm, Patient Position: Sitting, Cuff Size: Normal)   Pulse 76   Ht 5\' 11"  (1.803 m)   Wt 216 lb 6.4 oz (98.2 kg)   SpO2 95%   BMI 30.18 kg/m    Wt Readings from Last 3 Encounters:  07/04/23 216 lb 6.4 oz (98.2 kg)  05/08/23 216 lb (98 kg)  03/14/23 216 lb 14.4 oz (98.4 kg)     GEN: Well nourished, well developed in no acute distress NECK: No JVD; No carotid bruits CARDIAC: RRR, no murmurs, rubs, gallops RESPIRATORY:  Clear to auscultation without rales, wheezing or rhonchi  ABDOMEN: Soft, non-tender, non-distended EXTREMITIES:  No edema; No deformity, femoral bruit, mildly decreased dorsalis pedis and posttibial's bilaterally  ASSESSMENT AND PLAN:   CAD/CABG: He has disease as described and reported elsewhere.  He does have chest discomfort which could certainly be anginal which needs to be managed medically.   I will increase his isosorbide to  60 mg.  However, this certainly also could be related to his aortic stenosis which will be managed as below.    HTN:    The blood pressure is at target.  Continue the meds as listed.     AS: This has progressed and TAVR would be indicated if he is a candidate.  He be interested in this.  I contacted the structural team and they have set him up for an appointment with Dr. Druscilla Brownie and they will pursue further imaging and workup for possible valve replacement.   CAROTID STENOSIS:   He had 40 to 59% stenosis in the right and chronic occluded left Doppler in Sept 2024.  He no longer is being seen by [facility] and we will make sure he has follow-up carotid Doppler in 2025.   PVD: He was having some leg pain.  However, there was no obstructive disease found on ABIs earlier this year.  He will continue with risk reduction.    AAA:    He is status post repair.   He saw Dr. Edilia Bo earlier this year and routine follow-up was no longer suggested.   HYPERLIPIDEMIA:     He is intolerant of statins.  We  had managed to get him PCSK9 and he is going to have follow-up lipids by his primary provider in February.     DM:    A1C was 6.2.  No change in therapy.         Follow up with me in March or sooner if needed.   Signed, [name], MD

## 2023-07-04 ENCOUNTER — Other Ambulatory Visit: Payer: Self-pay | Admitting: Cardiology

## 2023-07-04 ENCOUNTER — Encounter: Payer: Self-pay | Admitting: Cardiology

## 2023-07-04 ENCOUNTER — Ambulatory Visit: Payer: Medicare HMO | Attending: Cardiology | Admitting: Cardiology

## 2023-07-04 VITALS — BP 120/62 | HR 76 | Ht 71.0 in | Wt 216.4 lb

## 2023-07-04 DIAGNOSIS — I10 Essential (primary) hypertension: Secondary | ICD-10-CM

## 2023-07-04 DIAGNOSIS — I350 Nonrheumatic aortic (valve) stenosis: Secondary | ICD-10-CM

## 2023-07-04 DIAGNOSIS — I2510 Atherosclerotic heart disease of native coronary artery without angina pectoris: Secondary | ICD-10-CM | POA: Diagnosis not present

## 2023-07-04 DIAGNOSIS — E118 Type 2 diabetes mellitus with unspecified complications: Secondary | ICD-10-CM

## 2023-07-04 MED ORDER — ISOSORBIDE MONONITRATE ER 60 MG PO TB24: 60.0000 mg | ORAL_TABLET | Freq: Every day | ORAL | 11 refills | Status: DC

## 2023-07-04 NOTE — Patient Instructions (Addendum)
Medication Instructions:  Increase Isosorbide Mononitrate to 60 mg daily *If you need a refill on your cardiac medications before your next appointment, please call your pharmacy*  Follow-Up: At [facility], you and your health needs are our priority.  As part of our continuing mission to provide you with exceptional heart care, we have created designated Provider Care Teams.  These Care Teams include your primary Cardiologist (physician) and Advanced Practice Providers (APPs -  Physician Assistants and Nurse Practitioners) who all work together to provide you with the care you need, when you need it.  We recommend signing up for the patient portal called "MyChart".  Sign up information is provided on this After Visit Summary.  MyChart is used to connect with patients for Virtual Visits (Telemedicine).  Patients are able to view lab/test results, encounter notes, upcoming appointments, etc.  Non-urgent messages can be sent to your provider as well.   To learn more about what you can do with MyChart, go to [email or web address].    Your next appointment:   3 month(s)  Provider:   [name], MD

## 2023-07-05 NOTE — Progress Notes (Unsigned)
Patient ID: Tony [name] MRN: [id number] DOB/AGE: 01-14-1941 82 y.o.  Primary Care Physician:Vyas, Angelina Pih, MD Primary Cardiologist: [name], MD  FOCUSED CARDIOVASCULAR PROBLEM LIST  Aortic stenosis AVA 0.98, mean gradient 42, V-max 4.1, EF 70% Sinus rhythm with first-degree AV block Coronary artery disease CABG 2014 LIMA to LAD, sequential SVG to OM1 and distal LCx, and vein graft to RCA Coronary angiography 2024 patent LIMA to LAD, occluded vein graft RCA and occluded distal limb of sequential vein graft Type 2 diabetes mellitus Not on insulin Hyperlipidemia On Repatha Hypertension CKD stage II BMI 30 Peripheral vascular disease Status post AAA repair and aortobiiliac grafting Moderate bilateral carotid stenosis   HISTORY OF PRESENT ILLNESS: The patient is a 82 y.o. male with the indicated medical history here for recommendations regarding his severe aortic stenosis.  He was seen by his primary cardiologist recently with complaints of dyspnea and fatigue.  Echocardiogram demonstrated progression to severe aortic stenosis.  The patient is here with his wife.  He has noticed increasing lifestyle limiting shortness of breath for the last 6 months.  He has a large amount of property and needs to attend to it.  He has been unable to do this to a great degree.  He first noticed that he became short of breath when he would walk to his mailbox.  He had no problems walking to the mailbox but walking back he had to negotiate an incline and he found he was short of breath and had to stop to catch his breath.  He underwent coronary angiography as detailed above.  He underwent an echocardiogram which demonstrated progression to severe arctic stenosis.  He was then instructed to take it easy until his valve was addressed.  Over the last few weeks he is not done much physically.  He is quite motivated to get back to his normal functional capacity because he enjoys taking care of his house  and property and is always on the go.  He denies any regular or reproducible angina but at times he does develop chest tightness.  He has had no presyncope or syncope.  He does develop bilateral lower extremity burning when he walks at times.  He thinks this is related to sciatica and wears a brace to help with this.  He has both upper and lower dentures.   Past Medical History:  Diagnosis Date   Aortic valve stenosis, mild    last echo in epic 03-22-2020, valve area 2.01 cm^2   Arthritis    Bilateral hydrocele    CKD (chronic kidney disease), stage III (HCC)    Claudication of left lower extremity (HCC)    per pt when walks, go up stairs   Coronary artery disease cardiologist--- dr hochrein   s/p  severe 3V CAD,  CABG x3 02-09-2013; s/p AAA and bi-iliac bypass graft 02-09-2020   Heart murmur    as a child   History of gout    09-07-2020  per pt has been several yrs, effects right great toe and trigger by asa   Hyperlipidemia    Hypertension    Occlusion and stenosis of bilateral carotid arteries    per last duplex in epic 05-05-2020 right ICA 40-59%, total occlusion left ICA   PAD (peripheral artery disease) (HCC)    known bilateral infrainguinal artial occlusive disease   PVD (peripheral vascular disease) (HCC)    vascular --- dr Edilia Bo---  s/p  AAA repair and aorta bi-iliac bypass graft  S/P AAA (abdominal aortic aneurysm) repair 02/09/2020   S/P CABG x 3 02/09/2013   LIMA - LAD,  SVG -- RCA,  seqSVG -- OM1 and distal CFx   Type 2 diabetes mellitus (HCC)    followed by pcp---- (09-07-2020 per pt checks blood sugar in am dialy,  fasting sugar--- 118--130)   Wears glasses     Past Surgical History:  Procedure Laterality Date   ABDOMINAL AORTIC ANEURYSM REPAIR N/A 02/09/2020   Procedure: ABDOMINAL AORTIC ANEURYSM REPAIR;  Surgeon: Chuck Hint, MD;  Location: [facility] OR;  Service: Vascular;  Laterality: N/A;   ANTERIOR CERVICAL DECOMP/DISCECTOMY FUSION  12-13-2008  @ [facility]    C5--6   AORTA - BILATERAL FEMORAL ARTERY BYPASS GRAFT Bilateral 02/09/2020   Procedure: AORTA BI-ILIAC  BYPASS GRAFT;  Surgeon: Chuck Hint, MD;  Location: [facility] OR;  Service: Vascular;  Laterality: Bilateral;   AORTIC ENDARTERECETOMY N/A 02/09/2020   Procedure: AORTIC ENDARTERECETOMY;  Surgeon: Chuck Hint, MD;  Location: [facility] OR;  Service: Vascular;  Laterality: N/A;   CARDIAC CATHETERIZATION  02-05-2013  dr hochrein   severe 3V CAD, preserved EF   CARDIAC CATHETERIZATION  per pt 1993   had one blockage with no intervention per cardiology note   CORONARY ARTERY BYPASS GRAFT N/A 02/09/2013   Procedure: CORONARY ARTERY BYPASS GRAFTING (CABG);  Surgeon: Kerin Perna, MD;  Location: [facility] OR;  Service: Open Heart Surgery;  Laterality: N/A;  LIMA to the LAD, SVG to right coronary artery, sequential SVG to OM and distal circumflex.   HYDROCELE EXCISION Bilateral 09/09/2020   Procedure: BILATERAL HYDROCELECTOMY ADULT;  Surgeon: Bjorn Pippin, MD;  Location: [facility];  Service: Urology;  Laterality: Bilateral;   INTRAOPERATIVE TRANSESOPHAGEAL ECHOCARDIOGRAM N/A 02/09/2013   Procedure: INTRAOPERATIVE TRANSESOPHAGEAL ECHOCARDIOGRAM;  Surgeon: Kerin Perna, MD;  Location: [facility] OR;  Service: Open Heart Surgery;  Laterality: N/A;   LEFT HEART CATH AND CORS/GRAFTS ANGIOGRAPHY N/A 02/04/2023   Procedure: LEFT HEART CATH AND CORS/GRAFTS ANGIOGRAPHY;  Surgeon: Marykay Lex, MD;  Location: [facility] INVASIVE CV LAB;  Service: Cardiovascular;  Laterality: N/A;   LUMBAR FUSION  08-04-2007  @[facility]    L3 -- L5   REVERSE SHOULDER ARTHROPLASTY Right 06/25/2018   Procedure: RIGHT REVERSE SHOULDER ARTHROPLASTY;  Surgeon: Bjorn Pippin, MD;  Location: [facility] OR;  Service: Orthopedics;  Laterality: Right;   SCROTAL EXPLORATION Bilateral 10/07/2020   Procedure: SCROTUM EXPLORATION, DRAINAGE OF HYDOCELE;  Surgeon: Bjorn Pippin, MD;  Location: [facility];  Service: Urology;  Laterality:  Bilateral;    Family History  Problem Relation Age of Onset   Heart failure Mother 93   Cancer Sister     Social History   Socioeconomic History   Marital status: Married    Spouse name: Not on file   Number of children: Not on file   Years of education: Not on file   Highest education level: Not on file  Occupational History   Not on file  Tobacco Use   Smoking status: Former    Current packs/day: 0.00    Average packs/day: 3.0 packs/day for 40.0 years (120.0 ttl pk-yrs)    Types: Cigarettes    Start date: 05/27/1952    Quit date: 05/27/1992    Years since quitting: 31.1   Smokeless tobacco: Never  Vaping Use   Vaping status: Never Used  Substance and Sexual Activity   Alcohol use: Not Currently    Comment: QUIT 1993   Drug use: Never  Sexual activity: Not on file  Other Topics Concern   Not on file  Social History Narrative   Lives at home with wife.          Social Determinants of Health   Financial Resource Strain: Not on file  Food Insecurity: Not on file  Transportation Needs: Not on file  Physical Activity: Not on file  Stress: Not on file  Social Connections: Not on file  Intimate Partner Violence: Not on file     Prior to Admission medications   Medication Sig Start Date End Date Taking? Authorizing Provider  amLODipine (NORVASC) 2.5 MG tablet Take 1 tablet (2.5 mg total) by mouth daily. 02/04/23 02/04/24  Marykay Lex, MD  aspirin EC 81 MG tablet Take 1 tablet (81 mg total) by mouth daily. Swallow whole. 01/30/23   [name], MD  benazepril (LOTENSIN) 20 MG tablet Take 20 mg by mouth 2 (two) times daily. 05/30/20   [provider]  Evolocumab (REPATHA SURECLICK) 140 MG/ML SOAJ Inject 140 mg into the skin every 14 (fourteen) days. 05/21/23   [name], MD  glipiZIDE (GLUCOTROL) 5 MG tablet Take 5 mg by mouth 2 (two) times daily.    [provider]  ibuprofen (ADVIL) 200 MG tablet Take 400 mg by mouth every 8 (eight) hours  as needed (pain.).    [provider]  isosorbide mononitrate (IMDUR) 60 MG 24 hr tablet Take 1 tablet (60 mg total) by mouth daily. 07/04/23   [name], MD  nitroGLYCERIN (NITROSTAT) 0.4 MG SL tablet Place 1 tablet (0.4 mg total) under the tongue every 5 (five) minutes as needed for chest pain. 05/08/23   [name], MD  rosuvastatin (CRESTOR) 10 MG tablet Take 1 tablet by mouth up to three times weekly as tolerated 05/16/23   [name], MD    Allergies  Allergen Reactions   Cortisone Nausea And Vomiting   Statins Other (See Comments)    Pt states severe muscle pains.    REVIEW OF SYSTEMS:  General: no fevers/chills/night sweats Eyes: no blurry vision, diplopia, or amaurosis ENT: no sore throat or hearing loss Resp: no cough, wheezing, or hemoptysis CV: no edema or palpitations GI: no abdominal pain, nausea, vomiting, diarrhea, or constipation GU: no dysuria, frequency, or hematuria Skin: no rash Neuro: no headache, numbness, tingling, or weakness of extremities Musculoskeletal: no joint pain or swelling Heme: no bleeding, DVT, or easy bruising Endo: no polydipsia or polyuria  BP (!) 152/88   Pulse 75   Ht 5\' 11"  (1.803 m)   Wt 216 lb 3.2 oz (98.1 kg)   SpO2 97%   BMI 30.15 kg/m   PHYSICAL EXAM: GEN:  AO x 3 in no acute distress HEENT: normal Dentition: Dentures Neck: JVP normal. +2 carotid upstrokes without bruits. No thyromegaly. Lungs: equal expansion, clear bilaterally CV: Apex is discrete and nondisplaced, RRR with 3 out of 6 systolic ejection murmur Abd: soft, non-tender, non-distended; no bruit; positive bowel sounds Ext: no edema, ecchymoses, or cyanosis Vascular: 2+ femoral pulses, 2+ radial pulses       Skin: warm and dry without rash Neuro: CN II-XII grossly intact; motor and sensory grossly intact    DATA AND STUDIES:  EKG:  EKG Interpretation Date/Time:  Monday July 08 2023 13:28:46 EST Ventricular Rate:  70 PR  Interval:  202 QRS Duration:  82 QT Interval:  406 QTC Calculation: 438 R Axis:   50  Text Interpretation: Sinus rhythm with occasional Premature ventricular  complexes Septal infarct (cited on or before 10-Feb-2013) When compared with ECG of 04-Feb-2023 06:16, Premature ventricular complexes are now Present PR interval has decreased Minimal criteria for Inferior infarct are no longer Present Confirmed by Alverda Skeans ([id number]) on 07/08/2023 2:04:38 PM        Cardiac Studies & Procedures   CARDIAC CATHETERIZATION  CARDIAC CATHETERIZATION 02/04/2023  Narrative   ------NATIVE CORONARIES----------   Annabell Sabal to Ost LAD lesion is 70% stenosed with 60% stenosed side branch in Ost Cx.   Ost LAD to Prox LAD lesion is 99% stenosed.  Prox LAD to Mid LAD lesion is 100% stenosed.AFTER 1st Diag & 1st Sep   Remainder of LCx is patent/normal; 1st Mrg lesion is 100% stenosed.  Grafted 2nd Mrg lesion is 100% stenosed.   Ost RCA to Prox RCA lesion is 100% stenosed.   ------GRAFTS----------   SVG-dRCA  graft was visualized by angiography and is normal in caliber.  Origin lesion is 100% stenosed.   Seq SVG- OM-dLCx graft was visualized by angiography and is normal in caliber.  Prox Graft to Dist Graft lesion between 2nd Mrg and 1st LPL is 100% stenosed.   LIM-LAD graft was visualized by angiography and is normal in caliber.  The graft exhibits no disease.   ---------HEMODYNAMICS-----------   There isat least MODERATE AORTIC VALVE STENOSIS. -Heavily calcified with severe jet.  Mean gradient is between 23.6-33 5 mmHg.   LV end diastolic pressure is mildly elevated.   SUMMARY Severe Native Vessel CAD with 2 of 4 patent Grafts: Ost-distal LM 70% with 99% ostial LAD & 65% Ost LCx LAD 100% occluded after D1 & SP1 LCx is a Large (? codominant) artery with a small OM1 & likely occluded Om2 (grafted) & terminates as a ~ distal LPL /PDA that has the appearance of a stump from the seqential limb of  SVG-OM-dLCx  Heavily Calcified Ostial RCA -very difficult to engage (due to aortic jet), appears to be 100% flush occluded Patent LIMA to LAD with retrograde filling D2 Patent proximal limb of Seq SVG-OM-dLCx, Occluded sequential limb 100% flush occluded SVG-RCA At Least Moderate Aortic Stenosis by direct measurement, however based on how difficult the valve was across and the severity of the aortic whip, could be more significant. Recommend reassessment of 2D Echo. = Mean gradient was either 23.6 mm or 33.4 mm depending on measurement. Minimally elevated LVEDP of 14 to 15 mmHg.   RECOMMENDATIONS Multiple potential sources for angina, however the only location amenable to PCI is the ostial LAD into the LCx which would require protecting the residual portion of the LAD.  This would require staged atherectomy and PCI.  It is also possible that he has had progression of his aortic valve disease. At this point recommendation will be aggressive medical management for what sounds like stable angina pectoris, if unable to control with medical therapy, could consider PCI of the ostial LM-LCx (which would need to be done via femoral access) I have added Imdur 30 mg daily and amlodipine 2.5 mg daily. Recommend 2D Echocardiogram with close assessment of aortic valve gradients.  Consider the possibility of paradoxical low-flow stenosis.    Bryan Lemma, MD  Findings Coronary Findings Diagnostic  Dominance: Right  Left Main Ost LM to Ost LAD lesion is 70% stenosed with 60% stenosed side branch in Ost Cx. The lesion is located at the bifurcation and eccentric. The lesion is calcified.  Left Anterior Descending Ost LAD to Prox LAD lesion is 99% stenosed. The  lesion is severely calcified. Prox LAD to Mid LAD lesion is 100% stenosed. The lesion is chronically occluded.  First Diagonal Branch Vessel is small in size.  Second Diagonal Branch Vessel is small in size.  Left Circumflex Vessel is  large.  First Obtuse Marginal Branch Vessel is small in size. 1st Mrg lesion is 100% stenosed.  Second Obtuse Marginal Branch Vessel is large in size. 2nd Mrg lesion is 100% stenosed.  Left Posterior Atrioventricular Artery Vessel is large in size.  Right Coronary Artery Vessel was not visualized due to known occlusion. Ost RCA to Prox RCA lesion is 100% stenosed. The lesion is severely calcified.  Saphenous Graft To RPDA SVG graft was visualized by angiography and is normal in caliber. Origin lesion is 100% stenosed.  Sequential Jump Graft Graft To 2nd Mrg, 1st LPL Seq SVG- OM-dLCx graft was visualized by angiography and is normal in caliber. Prox Graft to Dist Graft lesion between 2nd Mrg and 1st LPL  is 100% stenosed.  LIMA LIMA Graft To Mid LAD LIMA graft was visualized by angiography and is normal in caliber.  The graft exhibits no disease.  Intervention  No interventions have been documented.     ECHOCARDIOGRAM  ECHOCARDIOGRAM COMPLETE 05/27/2023  Narrative ECHOCARDIOGRAM REPORT    Patient Name:   Tony [name] Date of Exam: 05/27/2023 Medical Rec #:  [id number]      Height:       71.0 in Accession #:    [id number]     Weight:       216.0 lb Date of Birth:  1940-11-01      BSA:          2.179 m Patient Age:    82 years       BP:           146/80 mmHg Patient Gender: M              HR:           78 bpm. Exam Location:  [facility]  Procedure: 2D Echo, Cardiac Doppler, Color Doppler and Strain Analysis  Indications:    I35.0 Nonrheumatic aortic (valve) stenosis  History:        Patient has prior history of Echocardiogram examinations, most recent 05/31/2022. CAD, Prior CABG, Aortic Valve Disease; Risk Factors:Dyslipidemia, Former Smoker, Hypertension and Diabetes.  Sonographer:    Dominica Severin RCS, RVS Referring Phys: [id number] JAMES [name]  IMPRESSIONS   1. Left ventricular ejection fraction, by estimation, is 70 to 75%. The left ventricle has hyperdynamic  function. The left ventricle has no regional wall motion abnormalities. Left ventricular diastolic parameters are consistent with Grade I diastolic dysfunction (impaired relaxation). 2. Right ventricular systolic function is normal. The right ventricular size is normal. Tricuspid regurgitation signal is inadequate for assessing PA pressure. 3. Left atrial size was moderately dilated. 4. The mitral valve is normal in structure. Trivial mitral valve regurgitation. No evidence of mitral stenosis. 5. The aortic valve is tricuspid. There is severe calcifcation of the aortic valve. Aortic valve regurgitation is trivial. Aortic valve area, by VTI measures 0.98 cm. Aortic valve mean gradient measures 42.0 mmHg. Aortic valve Vmax measures 4.05 m/s. DVI is 0.28 consistent with moderate aortic valve stenosis but Vmax and PG across aortic valve consistent with severe aortic valve stenosis. 6. Aortic dilatation noted. There is borderline dilatation of the aortic root, measuring 37 mm. Normal size of ascending aorta. 7. The inferior vena cava is normal in size with <50% respiratory  variability, suggesting right atrial pressure of 8 mmHg.  Comparison(s): Changes from prior study are noted. Mean PG increased from 26 mm Hg to 42 mm Hg, V max worsened from 3.42 m/sec to 4.05 m/sec consistent with severe aortic valve stenosis. DVI worsened from 0.38 to 0.28, in moderate AS range but V max and PG consistent with severe AS.  FINDINGS Left Ventricle: Left ventricular ejection fraction, by estimation, is 70 to 75%. The left ventricle has hyperdynamic function. The left ventricle has no regional wall motion abnormalities. The left ventricular internal cavity size was normal in size. There is no left ventricular hypertrophy. Left ventricular diastolic parameters are consistent with Grade I diastolic dysfunction (impaired relaxation). Normal left ventricular filling pressure.  Right Ventricle: The right ventricular size is  normal. No increase in right ventricular wall thickness. Right ventricular systolic function is normal. Tricuspid regurgitation signal is inadequate for assessing PA pressure.  Left Atrium: Left atrial size was moderately dilated.  Right Atrium: Right atrial size was normal in size.  Pericardium: There is no evidence of pericardial effusion.  Mitral Valve: The mitral valve is normal in structure. Trivial mitral valve regurgitation. No evidence of mitral valve stenosis. MV peak gradient, 6.9 mmHg. The mean mitral valve gradient is 4.0 mmHg.  Tricuspid Valve: The tricuspid valve is normal in structure. Tricuspid valve regurgitation is not demonstrated. No evidence of tricuspid stenosis.  Aortic Valve: The aortic valve is tricuspid. There is severe calcifcation of the aortic valve. Aortic valve regurgitation is trivial. Severe aortic stenosis is present. Aortic valve mean gradient measures 42.0 mmHg. Aortic valve peak gradient measures 65.6 mmHg. Aortic valve area, by VTI measures 0.98 cm.  Pulmonic Valve: The pulmonic valve was not well visualized. Pulmonic valve regurgitation is trivial. No evidence of pulmonic stenosis.  Aorta: Aortic dilatation noted. There is borderline dilatation of the aortic root, measuring 37 mm.  Venous: The inferior vena cava is normal in size with less than 50% respiratory variability, suggesting right atrial pressure of 8 mmHg.  IAS/Shunts: No atrial level shunt detected by color flow Doppler.   LEFT VENTRICLE PLAX 2D LVIDd:         4.40 cm     Diastology LVIDs:         2.20 cm     LV e' medial:    6.85 cm/s LV PW:         1.10 cm     LV E/e' medial:  11.9 LV IVS:        1.10 cm     LV e' lateral:   7.51 cm/s LVOT diam:     2.00 cm     LV E/e' lateral: 10.8 LV SV:         85 LV SV Index:   39 LVOT Area:     3.14 cm  LV Volumes (MOD) LV vol d, MOD A2C: 80.0 ml LV vol d, MOD A4C: 62.8 ml LV vol s, MOD A2C: 30.0 ml LV vol s, MOD A4C: 25.2 ml LV SV MOD  A2C:     50.0 ml LV SV MOD A4C:     62.8 ml LV SV MOD BP:      46.9 ml  RIGHT VENTRICLE RV Basal diam:  3.10 cm RV Mid diam:    3.70 cm RV S prime:     14.90 cm/s TAPSE (M-mode): 2.4 cm  LEFT ATRIUM             Index  RIGHT ATRIUM           Index LA diam:        3.80 cm 1.74 cm/m   RA Area:     17.30 cm LA Vol (A2C):   98.9 ml 45.39 ml/m  RA Volume:   42.50 ml  19.51 ml/m LA Vol (A4C):   68.3 ml 31.35 ml/m LA Biplane Vol: 88.0 ml 40.39 ml/m AORTIC VALVE                     PULMONIC VALVE AV Area (Vmax):    0.87 cm      PV Vmax:       1.19 m/s AV Area (Vmean):   0.98 cm      PV Peak grad:  5.7 mmHg AV Area (VTI):     0.98 cm AV Vmax:           405.00 cm/s AV Vmean:          282.750 cm/s AV VTI:            0.870 m AV Peak Grad:      65.6 mmHg AV Mean Grad:      42.0 mmHg LVOT Vmax:         112.00 cm/s LVOT Vmean:        88.033 cm/s LVOT VTI:          0.272 m LVOT/AV VTI ratio: 0.31  AORTA Ao Root diam: 3.70 cm Ao Asc diam:  3.50 cm  MITRAL VALVE MV Area (PHT): 2.78 cm     SHUNTS MV Area VTI:   2.48 cm     Systemic VTI:  0.27 m MV Peak grad:  6.9 mmHg     Systemic Diam: 2.00 cm MV Mean grad:  4.0 mmHg MV Vmax:       1.31 m/s MV Vmean:      88.7 cm/s MV Decel Time: 273 msec MV E velocity: 81.40 cm/s MV A velocity: 108.00 cm/s MV E/A ratio:  0.75  Vishnu Priya Mallipeddi Electronically signed by Winfield Rast Mallipeddi Signature Date/Time: 05/28/2023/9:08:32 AM    Final             02/04/2023: BUN 21; Creatinine, Ser 1.22; Hemoglobin 14.4; Platelets 149; Potassium 3.8; Sodium 138   STS RISK CALCULATOR: pending  NHYA CLASS: 2    ASSESSMENT AND PLAN:   Coronary artery disease involving coronary bypass graft of native heart without angina pectoris - Plan: EKG 12-Lead, Basic metabolic panel  Severe aortic stenosis - Plan: Basic metabolic panel  The patient has developed NYHA class II symptoms of shortness of breath and severe aortic stenosis.   He underwent coronary angiography that I reviewed.  He does have an occluded distal limb of the sequential vein graft to his OM system as well as an occluded vein graft to RCA and native RCA.  Will refer the patient for a CT scan and cardiothoracic surgical opinion.  Given his history of prior sternotomy I am hopeful that CT scan will show that a transcatheter attic valve replacement will be feasible.  Further recommendations will be offered after all clinical studies have been reviewed.   I have personally reviewed the patients imaging data as summarized above.  I have reviewed the natural history of aortic stenosis with the patient and family members who are present today. We have discussed the limitations of medical therapy and the poor prognosis associated with symptomatic aortic stenosis. We have also reviewed potential treatment  options, including palliative medical therapy, conventional surgical aortic valve replacement, and transcatheter aortic valve replacement. We discussed treatment options in the context of this patient's specific comorbid medical conditions.   All of the patient's questions were answered today. Will make further recommendations based on the results of studies outlined above.   Total time spent with patient today 60 minutes. This includes reviewing records, evaluating the patient and coordinating care.   Orbie Pyo, MD  07/08/2023 2:09 PM    [facility] [address] [place], [place], [place]  [address] Phone: [phone]; Fax: [phone]

## 2023-07-08 ENCOUNTER — Encounter: Payer: Self-pay | Admitting: Internal Medicine

## 2023-07-08 ENCOUNTER — Ambulatory Visit: Payer: Medicare HMO | Attending: Internal Medicine | Admitting: Internal Medicine

## 2023-07-08 VITALS — BP 152/88 | HR 75 | Ht 71.0 in | Wt 216.2 lb

## 2023-07-08 DIAGNOSIS — I25810 Atherosclerosis of coronary artery bypass graft(s) without angina pectoris: Secondary | ICD-10-CM | POA: Diagnosis not present

## 2023-07-08 DIAGNOSIS — I350 Nonrheumatic aortic (valve) stenosis: Secondary | ICD-10-CM | POA: Diagnosis not present

## 2023-07-08 NOTE — Progress Notes (Addendum)
Pre Surgical Assessment: 5 M Walk Test  82M=16.54ft  5 Meter Walk Test- trial 1: 4.4 seconds 5 Meter Walk Test- trial 2: 4.19 seconds 5 Meter Walk Test- trial 3: 4.58 seconds 5 Meter Walk Test Average: 4.39 seconds  ___________________________  Procedure Type: Isolated AVR Perioperative Outcome Estimate % Operative Mortality 4.3% Morbidity & Mortality 10.9% Stroke 3% Renal Failure 1.87% Reoperation 3.88% Prolonged Ventilation 8.78% Deep Sternal Wound Infection 0.19% Long Hospital Stay (>14 days) 5.09% Short Hospital Stay (<6 days)* 38.9%

## 2023-07-08 NOTE — Patient Instructions (Addendum)
Medication Instructions:   Your physician recommends that you continue on your current medications as directed. Please refer to the Current Medication list given to you today.  *If you need a refill on your cardiac medications before your next appointment, please call your pharmacy*   Lab Work:  TODAY--BMET  If you have labs (blood work) drawn today and your tests are completely normal, you will receive your results only by: MyChart Message (if you have MyChart) OR A paper copy in the mail If you have any lab test that is abnormal or we need to change your treatment, we will call you to review the results.   Testing/Procedures:  PRE-TAVR TESTING --INSTRUCTION LETTER AS PROVIDED TO YOU TODAY IN THE OFFICE    Follow-Up:  AS SCHEDULED AND DIRECTED BY STRUCTURAL TEAM

## 2023-07-09 LAB — BASIC METABOLIC PANEL
BUN/Creatinine Ratio: 11 (ref 10–24)
BUN: 13 mg/dL (ref 8–27)
CO2: 22 mmol/L (ref 20–29)
Calcium: 9.3 mg/dL (ref 8.6–10.2)
Chloride: 104 mmol/L (ref 96–106)
Creatinine, Ser: 1.18 mg/dL (ref 0.76–1.27)
Glucose: 122 mg/dL — ABNORMAL HIGH (ref 70–99)
Potassium: 4.5 mmol/L (ref 3.5–5.2)
Sodium: 140 mmol/L (ref 134–144)
eGFR: 62 mL/min/{1.73_m2} (ref >59)

## 2023-07-15 ENCOUNTER — Ambulatory Visit (HOSPITAL_COMMUNITY)
Admission: RE | Admit: 2023-07-15 | Discharge: 2023-07-15 | Disposition: A | Discharge status: Home / Self Care | Payer: Medicare HMO | Source: Ambulatory Visit | Attending: Cardiology | Admitting: Cardiology

## 2023-07-15 ENCOUNTER — Encounter: Payer: Self-pay | Admitting: Physician Assistant

## 2023-07-15 DIAGNOSIS — E279 Disorder of adrenal gland, unspecified: Secondary | ICD-10-CM | POA: Diagnosis not present

## 2023-07-15 DIAGNOSIS — I350 Nonrheumatic aortic (valve) stenosis: Secondary | ICD-10-CM | POA: Diagnosis not present

## 2023-07-15 DIAGNOSIS — I701 Atherosclerosis of renal artery: Secondary | ICD-10-CM | POA: Diagnosis not present

## 2023-07-15 DIAGNOSIS — I7143 Infrarenal abdominal aortic aneurysm, without rupture: Secondary | ICD-10-CM | POA: Diagnosis not present

## 2023-07-15 MED ORDER — IOHEXOL 350 MG/ML SOLN
120.0000 mL | Freq: Once | INTRAVENOUS | Status: AC | PRN
  Administered 2023-07-15: 120 mL via INTRAVENOUS

## 2023-08-06 NOTE — Progress Notes (Unsigned)
[address]       [place] [address]             [phone]           Tony [name] [place] Medical Record #[id number] Date of Birth: 1941-06-06  [name], MD Ignatius Specking, MD  Chief Complaint: increasing CP and SOB    History of Present Illness:     Pt is a very active and healthy 82 yo male who underwent a CABG in the past by DR Morton Peters who also had abdominal aortic aneurysm repair who has been suffering from what sounds like exertional angina with CP in left lower chest area that resolves with rest. Pt also with associated SOB. Pt had work up with TTE with severe AS with a mean gradient of and normal LV function. HE had cath with 2/4 patent grafts and confirmed at least moderate AS at cath. HE had CTA With appropriate anatomy and access for a 26 mm sapien valve. Pt is here to discuss TAVR options      Past Medical History:  Diagnosis Date   Arthritis    Bilateral hydrocele    CKD (chronic kidney disease), stage III (HCC)    Claudication of left lower extremity (HCC)    per pt when walks, go up stairs   Coronary artery disease cardiologist--- dr hochrein   s/p  severe 3V CAD,  CABG x3 02-09-2013; s/p AAA and bi-iliac bypass graft 02-09-2020   History of gout    09-07-2020  per pt has been several yrs, effects right great toe and trigger by asa   Hyperlipidemia    Hypertension    Occlusion and stenosis of bilateral carotid arteries    per last duplex in epic 05-05-2020 right ICA 40-59%, total occlusion left ICA   PVD (peripheral vascular disease) (HCC)    vascular --- dr Edilia Bo---  s/p  AAA repair and aorta bi-iliac bypass graft   S/P AAA (abdominal aortic aneurysm) repair 02/09/2020   S/P CABG x 3 02/09/2013   LIMA - LAD,  SVG -- RCA,  seqSVG -- OM1 and distal CFx   Severe aortic stenosis    Type 2 diabetes mellitus (HCC)    followed by pcp---- (09-07-2020 per pt checks blood sugar in am dialy,  fasting sugar--- 118--130)    Wears glasses     Past Surgical History:  Procedure Laterality Date   ABDOMINAL AORTIC ANEURYSM REPAIR N/A 02/09/2020   Procedure: ABDOMINAL AORTIC ANEURYSM REPAIR;  Surgeon: Chuck Hint, MD;  Location: [facility] OR;  Service: Vascular;  Laterality: N/A;   ANTERIOR CERVICAL DECOMP/DISCECTOMY FUSION  12-13-2008  @ [facility]   C5--6   AORTA - BILATERAL FEMORAL ARTERY BYPASS GRAFT Bilateral 02/09/2020   Procedure: AORTA BI-ILIAC  BYPASS GRAFT;  Surgeon: Chuck Hint, MD;  Location: [facility] OR;  Service: Vascular;  Laterality: Bilateral;   AORTIC ENDARTERECETOMY N/A 02/09/2020   Procedure: AORTIC ENDARTERECETOMY;  Surgeon: Chuck Hint, MD;  Location: [facility] OR;  Service: Vascular;  Laterality: N/A;   CARDIAC CATHETERIZATION  02-05-2013  dr hochrein   severe 3V CAD, preserved EF   CARDIAC CATHETERIZATION  per pt 1993   had one blockage with no intervention per cardiology note   CORONARY ARTERY BYPASS GRAFT N/A 02/09/2013   Procedure: CORONARY ARTERY BYPASS GRAFTING (CABG);  Surgeon: Kerin Perna, MD;  Location[facility] OR;  Service: Open Heart Surgery;  Laterality: N/A;  LIMA to  the LAD, SVG to right coronary artery, sequential SVG to OM and distal circumflex.   HYDROCELE EXCISION Bilateral 09/09/2020   Procedure: BILATERAL HYDROCELECTOMY ADULT;  Surgeon: Bjorn Pippin, MD;  Location: [facility];  Service: Urology;  Laterality: Bilateral;   INTRAOPERATIVE TRANSESOPHAGEAL ECHOCARDIOGRAM N/A 02/09/2013   Procedure: INTRAOPERATIVE TRANSESOPHAGEAL ECHOCARDIOGRAM;  Surgeon: Kerin Perna, MD;  Location: [facility] OR;  Service: Open Heart Surgery;  Laterality: N/A;   LEFT HEART CATH AND CORS/GRAFTS ANGIOGRAPHY N/A 02/04/2023   Procedure: LEFT HEART CATH AND CORS/GRAFTS ANGIOGRAPHY;  Surgeon: Marykay Lex, MD;  Location: [facility] INVASIVE CV LAB;  Service: Cardiovascular;  Laterality: N/A;   LUMBAR FUSION  08-04-2007  @[facility]    L3 -- L5   REVERSE SHOULDER ARTHROPLASTY Right 06/25/2018    Procedure: RIGHT REVERSE SHOULDER ARTHROPLASTY;  Surgeon: Bjorn Pippin, MD;  Location: [facility] OR;  Service: Orthopedics;  Laterality: Right;   SCROTAL EXPLORATION Bilateral 10/07/2020   Procedure: SCROTUM EXPLORATION, DRAINAGE OF HYDOCELE;  Surgeon: Bjorn Pippin, MD;  Location: [facility];  Service: Urology;  Laterality: Bilateral;    Social History   Tobacco Use  Smoking Status Former   Current packs/day: 0.00   Average packs/day: 3.0 packs/day for 40.0 years (120.0 ttl pk-yrs)   Types: Cigarettes   Start date: 05/27/1952   Quit date: 05/27/1992   Years since quitting: 31.2  Smokeless Tobacco Never    Social History   Substance and Sexual Activity  Alcohol Use Not Currently   Comment: QUIT 1993    Social History   Socioeconomic History   Marital status: Married    Spouse name: Not on file   Number of children: Not on file   Years of education: Not on file   Highest education level: Not on file  Occupational History   Not on file  Tobacco Use   Smoking status: Former    Current packs/day: 0.00    Average packs/day: 3.0 packs/day for 40.0 years (120.0 ttl pk-yrs)    Types: Cigarettes    Start date: 05/27/1952    Quit date: 05/27/1992    Years since quitting: 31.2   Smokeless tobacco: Never  Vaping Use   Vaping status: Never Used  Substance and Sexual Activity   Alcohol use: Not Currently    Comment: QUIT 1993   Drug use: Never   Sexual activity: Not on file  Other Topics Concern   Not on file  Social History Narrative   Lives at home with wife.          Social Determinants of Health   Financial Resource Strain: Not on file  Food Insecurity: Not on file  Transportation Needs: Not on file  Physical Activity: Not on file  Stress: Not on file  Social Connections: Not on file  Intimate Partner Violence: Not on file    Allergies  Allergen Reactions   Cortisone Nausea And Vomiting   Statins Other (See Comments)    Pt states severe muscle pains.     Current Outpatient Medications  Medication Sig Dispense Refill   amLODipine (NORVASC) 2.5 MG tablet Take 1 tablet (2.5 mg total) by mouth daily. 30 tablet 11   aspirin EC 81 MG tablet Take 1 tablet (81 mg total) by mouth daily. Swallow whole. 90 tablet 3   benazepril (LOTENSIN) 20 MG tablet Take 20 mg by mouth 2 (two) times daily.     Evolocumab (REPATHA SURECLICK) 140 MG/ML SOAJ Inject 140 mg into the skin every 14 (fourteen)  days. 6 mL 3   glipiZIDE (GLUCOTROL) 5 MG tablet Take 5 mg by mouth 2 (two) times daily.     ibuprofen (ADVIL) 200 MG tablet Take 400 mg by mouth every 8 (eight) hours as needed (pain.).     isosorbide mononitrate (IMDUR) 60 MG 24 hr tablet Take 1 tablet (60 mg total) by mouth daily. 30 tablet 11   nitroGLYCERIN (NITROSTAT) 0.4 MG SL tablet Place 1 tablet (0.4 mg total) under the tongue every 5 (five) minutes as needed for chest pain. 25 tablet PRN   rosuvastatin (CRESTOR) 10 MG tablet Take 1 tablet by mouth up to three times weekly as tolerated 36 tablet 3   No current facility-administered medications for this visit.     Family History  Problem Relation Age of Onset   Heart failure Mother 1   Cancer Sister        Physical Exam: Lungs: Clear Card: RR with harsh systolic murmur Ext: Warm Neuro: alert Teeth in good repair     Diagnostic Studies & Laboratory data: I have personally reviewed the following studies and agree with the findings   TTE (20/2024) IMPRESSIONS     1. Left ventricular ejection fraction, by estimation, is 70 to 75%. The  left ventricle has hyperdynamic function. The left ventricle has no  regional wall motion abnormalities. Left ventricular diastolic parameters  are consistent with Grade I diastolic  dysfunction (impaired relaxation).   2. Right ventricular systolic function is normal. The right ventricular  size is normal. Tricuspid regurgitation signal is inadequate for assessing  PA pressure.   3. Left atrial size  was moderately dilated.   4. The mitral valve is normal in structure. Trivial mitral valve  regurgitation. No evidence of mitral stenosis.   5. The aortic valve is tricuspid. There is severe calcifcation of the  aortic valve. Aortic valve regurgitation is trivial. Aortic valve area, by  VTI measures 0.98 cm. Aortic valve mean gradient measures 42.0 mmHg.  Aortic valve Vmax measures 4.05 m/s.  DVI is 0.28 consistent with moderate aortic valve stenosis but Vmax and PG  across aortic valve consistent with severe aortic valve stenosis.   6. Aortic dilatation noted. There is borderline dilatation of the aortic  root, measuring 37 mm. Normal size of ascending aorta.   7. The inferior vena cava is normal in size with <50% respiratory  variability, suggesting right atrial pressure of 8 mmHg.   Comparison(s): Changes from prior study are noted. Mean PG increased from  26 mm Hg to 42 mm Hg, V max worsened from 3.42 m/sec to 4.05 m/sec  consistent with severe aortic valve stenosis. DVI worsened from 0.38 to  0.28, in moderate AS range but V max and  PG consistent with severe AS.   FINDINGS   Left Ventricle: Left ventricular ejection fraction, by estimation, is 70  to 75%. The left ventricle has hyperdynamic function. The left ventricle  has no regional wall motion abnormalities. The left ventricular internal  cavity size was normal in size.  There is no left ventricular hypertrophy. Left ventricular diastolic  parameters are consistent with Grade I diastolic dysfunction (impaired  relaxation). Normal left ventricular filling pressure.   Right Ventricle: The right ventricular size is normal. No increase in  right ventricular wall thickness. Right ventricular systolic function is  normal. Tricuspid regurgitation signal is inadequate for assessing PA  pressure.   Left Atrium: Left atrial size was moderately dilated.   Right Atrium: Right atrial size was normal  in size.   Pericardium: There  is no evidence of pericardial effusion.   Mitral Valve: The mitral valve is normal in structure. Trivial mitral  valve regurgitation. No evidence of mitral valve stenosis. MV peak  gradient, 6.9 mmHg. The mean mitral valve gradient is 4.0 mmHg.   Tricuspid Valve: The tricuspid valve is normal in structure. Tricuspid  valve regurgitation is not demonstrated. No evidence of tricuspid  stenosis.   Aortic Valve: The aortic valve is tricuspid. There is severe calcifcation  of the aortic valve. Aortic valve regurgitation is trivial. Severe aortic  stenosis is present. Aortic valve mean gradient measures 42.0 mmHg. Aortic  valve peak gradient measures  65.6 mmHg. Aortic valve area, by VTI measures 0.98 cm.   Pulmonic Valve: The pulmonic valve was not well visualized. Pulmonic valve  regurgitation is trivial. No evidence of pulmonic stenosis.   Aorta: Aortic dilatation noted. There is borderline dilatation of the  aortic root, measuring 37 mm.   Venous: The inferior vena cava is normal in size with less than 50%  respiratory variability, suggesting right atrial pressure of 8 mmHg.   IAS/Shunts: No atrial level shunt detected by color flow Doppler.     LEFT VENTRICLE  PLAX 2D  LVIDd:         4.40 cm     Diastology  LVIDs:         2.20 cm     LV e' medial:    6.85 cm/s  LV PW:         1.10 cm     LV E/e' medial:  11.9  LV IVS:        1.10 cm     LV e' lateral:   7.51 cm/s  LVOT diam:     2.00 cm     LV E/e' lateral: 10.8  LV SV:         85  LV SV Index:   39  LVOT Area:     3.14 cm    LV Volumes (MOD)  LV vol d, MOD A2C: 80.0 ml  LV vol d, MOD A4C: 62.8 ml  LV vol s, MOD A2C: 30.0 ml  LV vol s, MOD A4C: 25.2 ml  LV SV MOD A2C:     50.0 ml  LV SV MOD A4C:     62.8 ml  LV SV MOD BP:      46.9 ml   RIGHT VENTRICLE  RV Basal diam:  3.10 cm  RV Mid diam:    3.70 cm  RV S prime:     14.90 cm/s  TAPSE (M-mode): 2.4 cm   LEFT ATRIUM             Index        RIGHT ATRIUM            Index  LA diam:        3.80 cm 1.74 cm/m   RA Area:     17.30 cm  LA Vol (A2C):   98.9 ml 45.39 ml/m  RA Volume:   42.50 ml  19.51 ml/m  LA Vol (A4C):   68.3 ml 31.35 ml/m  LA Biplane Vol: 88.0 ml 40.39 ml/m   AORTIC VALVE                     PULMONIC VALVE  AV Area (Vmax):    0.87 cm      PV Vmax:       1.19 m/s  AV Area (Vmean):   0.98 cm      PV Peak grad:  5.7 mmHg  AV Area (VTI):     0.98 cm  AV Vmax:           405.00 cm/s  AV Vmean:          282.750 cm/s  AV VTI:            0.870 m  AV Peak Grad:      65.6 mmHg  AV Mean Grad:      42.0 mmHg  LVOT Vmax:         112.00 cm/s  LVOT Vmean:        88.033 cm/s  LVOT VTI:          0.272 m  LVOT/AV VTI ratio: 0.31    AORTA  Ao Root diam: 3.70 cm  Ao Asc diam:  3.50 cm   MITRAL VALVE  MV Area (PHT): 2.78 cm     SHUNTS  MV Area VTI:   2.48 cm     Systemic VTI:  0.27 m  MV Peak grad:  6.9 mmHg     Systemic Diam: 2.00 cm  MV Mean grad:  4.0 mmHg  MV Vmax:       1.31 m/s  MV Vmean:      88.7 cm/s  MV Decel Time: 273 msec  MV E velocity: 81.40 cm/s  MV A velocity: 108.00 cm/s  MV E/A ratio:  0.75   CATH (01/2023) Conclusion      ------NATIVE CORONARIES----------   Suezanne Jacquet LM to Ost LAD lesion is 70% stenosed with 60% stenosed side branch in Ost Cx.   Ost LAD to Prox LAD lesion is 99% stenosed.  Prox LAD to Mid LAD lesion is 100% stenosed.AFTER 1st Diag & 1st Sep   Remainder of LCx is patent/normal; 1st Mrg lesion is 100% stenosed.  Grafted 2nd Mrg lesion is 100% stenosed.   Ost RCA to Prox RCA lesion is 100% stenosed.   ------GRAFTS----------   SVG-dRCA  graft was visualized by angiography and is normal in caliber.  Origin lesion is 100% stenosed.   Seq SVG- OM-dLCx graft was visualized by angiography and is normal in caliber.  Prox Graft to Dist Graft lesion between 2nd Mrg and 1st LPL is 100% stenosed.   LIM-LAD graft was visualized by angiography and is normal in caliber.  The graft exhibits no disease.    ---------HEMODYNAMICS-----------   There isat least MODERATE AORTIC VALVE STENOSIS. -Heavily calcified with severe jet.  Mean gradient is between 23.6-33 5 mmHg.   LV end diastolic pressure is mildly elevated.     SUMMARY Severe Native Vessel CAD with 2 of 4 patent Grafts: Ost-distal LM 70% with 99% ostial LAD & 65% Ost LCx LAD 100% occluded after D1 & SP1 LCx is a Large (? codominant) artery with a small OM1 & likely occluded Om2 (grafted) & terminates as a ~ distal LPL /PDA that has the appearance of a stump from the seqential limb of SVG-OM-dLCx   Heavily Calcified Ostial RCA -very difficult to engage (due to aortic jet), appears to be 100% flush occluded Patent LIMA to LAD with retrograde filling D2 Patent proximal limb of Seq SVG-OM-dLCx, Occluded sequential limb 100% flush occluded SVG-RCA At Least Moderate Aortic Stenosis by direct measurement, however based on how difficult the valve was across and the severity of the aortic whip, could be more significant. Recommend reassessment of 2D Echo. =  Mean gradient  was either 23.6 mm or 33.4 mm depending on measurement. Minimally elevated LVEDP of 14 to 15 mmHg.    Recent Radiology Findings:   CTA (07/2023) FINDINGS: Cardiovascular: Atheromatous calcifications. See findings discussed in the body of the report.   Mediastinum/Nodes: No suspicious adenopathy identified. Imaged mediastinal structures are unremarkable.   Lungs/Pleura: Imaged lungs are clear. No pleural effusion or pneumothorax.   Upper Abdomen: No acute abnormality.   Musculoskeletal: No chest wall abnormality. No acute osseous findings.   IMPRESSION: No acute extracardiac incidental findings.     Electronically Signed   By: Layla Maw M.D.   On: 08/01/2023 12:20    Addended by Domenic Polite, MD on 08/01/2023 12:23 PM    Study Result  Narrative & Impression  CLINICAL DATA:  Aortic Stenosis   EXAM: Cardiac TAVR CT   TECHNIQUE: The patient  was scanned on a Siemens Force 192 slice scanner. A 120 kV retrospective scan was triggered in the ascending thoracic aorta at 140 HU's. Gantry rotation speed was 250 msecs and collimation was .6 mm. No beta blockade or nitro were given. The 3D data set was reconstructed in 5% intervals of the R-R cycle. Systolic and diastolic phases were analyzed on a dedicated work station using MPR, MIP and VRT modes. The patient received 80 cc of contrast.   FINDINGS: Aortic Valve: Heavily calcified tri leaflet AV with score 3281   Aorta: Severe calcific atherosclerosis no aneurysm normal arch vessels   Sino-tubular Junction: Calcified 28.5 mm   Ascending Thoracic Aorta: 34 mm   Aortic Arch: 28 mm   Descending Thoracic Aorta: Tortuous 30 mm   Sinus of Valsalva Measurements:   Non-coronary: 36.9 mm  Height 28 mm   Right - coronary: 37.9 mm  Height 26.3 mm   Left -   coronary: 36.8 mm  Height 23.6 mm   Coronary Artery Height above Annulus:   Left Main: 17.8 mm above annulus   Right Coronary: RCA 22.4 mm above annulus   Virtual Basal Annulus Measurements:   Maximum / Minimum Diameter: 27.5 mm x 20.6 mm Average diameter 24.3 mm   Perimeter: 78.4 mm   Area: 464 mm2   Coronary Arteries: Sufficient height above annulus for deployment   Optimum Fluoroscopic Angle for Delivery: LAO 12 Caudal 5 degrees   Membranous septal length 6 mm   IMPRESSION: 1.  Calcified tri leaflet AV with score 3281   2. Annular area of 464 mm2 suitable for a 26 mm Sapien valve. Given dense nodular calcium at base of non coronary cusp would not likely recommend self expanding valve   3. Coronary arteries sufficient height above annulus for deployment Occluded native RCA and occluded SVG to RCA. Patient LIMA to LAD and patent SVG to OM   4.  Membranous septal length 6 mm   5. Optimum angiographic angle for deployment LAO 12 Caudal 5 degrees   6. Severe calcific atherosclerosis of aorta with tortuous  descending thoracic aorta         Recent Lab Findings: Lab Results  Component Value Date   WBC 6.4 02/04/2023   HGB 14.4 02/04/2023   HCT 44.5 02/04/2023   PLT 149 (L) 02/04/2023   GLUCOSE 122 (H) 07/08/2023   CHOL 210 (H) 02/06/2013   TRIG 377 (H) 02/06/2013   HDL 32 (L) 02/06/2013   LDLCALC 103 (H) 02/06/2013   ALT 15 02/10/2020   AST 22 02/10/2020   NA 140 07/08/2023   K 4.5 07/08/2023  CL 104 07/08/2023   CREATININE 1.18 07/08/2023   BUN 13 07/08/2023   CO2 22 07/08/2023   INR 1.1 02/09/2020   HGBA1C 5.9 (H) 02/04/2023      Assessment / Plan:     Pt is an 82 yo male with NYHA class 2 symptoms of severe AS with normal LV function and 2/4 occluded grafts from previous CABG. With his age, previous CABG and AAA repair, he would do better with TAVR and we discussed the risks and goals and recovery from TAVR and he wishes to proceed. He would be a bail out candidate   I have spent 60 min in review of the records, viewing studies and in face to face with patient and in coordination of future care    Eugenio Hoes 08/06/2023 6:33 PM

## 2023-08-07 ENCOUNTER — Institutional Professional Consult (permissible substitution): Payer: Medicare HMO | Admitting: Thoracic Surgery (Cardiothoracic Vascular Surgery)

## 2023-08-07 ENCOUNTER — Other Ambulatory Visit: Payer: Self-pay

## 2023-08-07 ENCOUNTER — Encounter: Payer: Self-pay | Admitting: Thoracic Surgery (Cardiothoracic Vascular Surgery)

## 2023-08-07 VITALS — BP 140/65 | HR 73 | Resp 20 | Ht 71.0 in | Wt 216.0 lb

## 2023-08-07 DIAGNOSIS — I350 Nonrheumatic aortic (valve) stenosis: Secondary | ICD-10-CM | POA: Diagnosis not present

## 2023-08-07 NOTE — Patient Instructions (Signed)
TAVR

## 2023-08-08 ENCOUNTER — Encounter: Payer: Medicare HMO | Admitting: Thoracic Surgery (Cardiothoracic Vascular Surgery)

## 2023-08-09 ENCOUNTER — Encounter (HOSPITAL_COMMUNITY)
Admission: RE | Admit: 2023-08-09 | Discharge: 2023-08-09 | Disposition: A | Discharge status: Home / Self Care | Payer: Medicare HMO | Source: Ambulatory Visit | Attending: Internal Medicine | Admitting: Internal Medicine

## 2023-08-09 ENCOUNTER — Ambulatory Visit (HOSPITAL_COMMUNITY)
Admission: RE | Admit: 2023-08-09 | Discharge: 2023-08-09 | Disposition: A | Discharge status: Home / Self Care | Payer: Medicare HMO | Source: Ambulatory Visit | Attending: Internal Medicine | Admitting: Internal Medicine

## 2023-08-09 ENCOUNTER — Other Ambulatory Visit: Payer: Self-pay

## 2023-08-09 ENCOUNTER — Telehealth: Payer: Self-pay | Admitting: Cardiology

## 2023-08-09 DIAGNOSIS — Z01812 Encounter for preprocedural laboratory examination: Secondary | ICD-10-CM | POA: Diagnosis not present

## 2023-08-09 DIAGNOSIS — I350 Nonrheumatic aortic (valve) stenosis: Secondary | ICD-10-CM | POA: Insufficient documentation

## 2023-08-09 DIAGNOSIS — R9431 Abnormal electrocardiogram [ECG] [EKG]: Secondary | ICD-10-CM | POA: Diagnosis not present

## 2023-08-09 DIAGNOSIS — Z01810 Encounter for preprocedural cardiovascular examination: Secondary | ICD-10-CM | POA: Diagnosis not present

## 2023-08-09 DIAGNOSIS — Z01818 Encounter for other preprocedural examination: Secondary | ICD-10-CM | POA: Insufficient documentation

## 2023-08-09 DIAGNOSIS — I771 Stricture of artery: Secondary | ICD-10-CM | POA: Diagnosis not present

## 2023-08-09 LAB — COMPREHENSIVE METABOLIC PANEL
ALT: 20 U/L (ref 0–44)
AST: 21 U/L (ref 15–41)
Albumin: 4.2 g/dL (ref 3.5–5.0)
Alkaline Phosphatase: 55 U/L (ref 38–126)
Anion gap: 9 (ref 5–15)
BUN: 17 mg/dL (ref 8–23)
CO2: 27 mmol/L (ref 22–32)
Calcium: 9.5 mg/dL (ref 8.9–10.3)
Chloride: 101 mmol/L (ref 98–111)
Creatinine, Ser: 1.33 mg/dL — ABNORMAL HIGH (ref 0.61–1.24)
GFR, Estimated: 54 mL/min — ABNORMAL LOW (ref >60)
Glucose, Bld: 115 mg/dL — ABNORMAL HIGH (ref 70–99)
Potassium: 3.8 mmol/L (ref 3.5–5.1)
Sodium: 137 mmol/L (ref 135–145)
Total Bilirubin: 1 mg/dL (ref <1.2)
Total Protein: 6.9 g/dL (ref 6.5–8.1)

## 2023-08-09 LAB — CBC
HCT: 43.5 % (ref 39.0–52.0)
Hemoglobin: 14.6 g/dL (ref 13.0–17.0)
MCH: 30.2 pg (ref 26.0–34.0)
MCHC: 33.6 g/dL (ref 30.0–36.0)
MCV: 90.1 fL (ref 80.0–100.0)
Platelets: 146 10*3/uL — ABNORMAL LOW (ref 150–400)
RBC: 4.83 MIL/uL (ref 4.22–5.81)
RDW: 13.2 % (ref 11.5–15.5)
WBC: 7.3 10*3/uL (ref 4.0–10.5)
nRBC: 0 % (ref 0.0–0.2)

## 2023-08-09 LAB — URINALYSIS, ROUTINE W REFLEX MICROSCOPIC
Bilirubin Urine: NEGATIVE
Glucose, UA: NEGATIVE mg/dL
Hgb urine dipstick: NEGATIVE
Ketones, ur: NEGATIVE mg/dL
Leukocytes,Ua: NEGATIVE
Nitrite: NEGATIVE
Protein, ur: NEGATIVE mg/dL
Specific Gravity, Urine: 1.011 (ref 1.005–1.030)
pH: 6 (ref 5.0–8.0)

## 2023-08-09 LAB — PROTIME-INR
INR: 0.9 (ref 0.8–1.2)
Prothrombin Time: 12.6 s (ref 11.4–15.2)

## 2023-08-09 LAB — SURGICAL PCR SCREEN
MRSA, PCR: NEGATIVE
Staphylococcus aureus: NEGATIVE

## 2023-08-09 LAB — SARS CORONAVIRUS 2 (TAT 6-24 HRS): SARS Coronavirus 2: NEGATIVE

## 2023-08-09 LAB — TYPE AND SCREEN
ABO/RH(D): O POS
Antibody Screen: NEGATIVE

## 2023-08-09 NOTE — Progress Notes (Signed)
 Patient signed all consents at PAT lab appointment. CHG soap and instructions were given to patient. CHG surgical prep reviewed with patient and all questions answered.  Patients chart send to anesthesia for review. Pt denies any respiratory illness/infection in the last two months.

## 2023-08-09 NOTE — Telephone Encounter (Signed)
Pre TAVR labs reviewed with no concerns. Cr elevated slightly above baseline. Plan to repeat BMET while inpatient and give IV hydration post procedure.    Georgie Chard NP-C Structural Heart Team  Phone: [phone]

## 2023-08-12 MED ORDER — CEFAZOLIN SODIUM-DEXTROSE 2-4 GM/100ML-% IV SOLN
2.0000 g | INTRAVENOUS | Status: AC
  Administered 2023-08-13: 2 g via INTRAVENOUS
  Filled 2023-08-12: qty 100

## 2023-08-12 MED ORDER — POTASSIUM CHLORIDE 2 MEQ/ML IV SOLN
80.0000 meq | INTRAVENOUS | Status: DC
  Filled 2023-08-12 (×2): qty 40

## 2023-08-12 MED ORDER — NOREPINEPHRINE 4 MG/250ML-% IV SOLN
0.0000 ug/min | INTRAVENOUS | Status: AC
  Administered 2023-08-13: 2 ug/min via INTRAVENOUS
  Filled 2023-08-12: qty 250

## 2023-08-12 MED ORDER — MAGNESIUM SULFATE 50 % IJ SOLN
40.0000 meq | INTRAMUSCULAR | Status: DC
  Filled 2023-08-12 (×2): qty 9.85

## 2023-08-12 MED ORDER — HEPARIN 30,000 UNITS/1000 ML (OHS) CELLSAVER SOLUTION
Status: DC
  Filled 2023-08-12 (×2): qty 1000

## 2023-08-12 MED ORDER — DEXMEDETOMIDINE HCL IN NACL 400 MCG/100ML IV SOLN
0.1000 ug/kg/h | INTRAVENOUS | Status: AC
  Administered 2023-08-13: 98 ug via INTRAVENOUS
  Administered 2023-08-13: 1.2 ug/kg/h via INTRAVENOUS
  Filled 2023-08-12: qty 100

## 2023-08-12 NOTE — Anesthesia Preprocedure Evaluation (Addendum)
Anesthesia Evaluation  Patient identified by MRN, date of birth, ID band Patient awake    Reviewed: Allergy & Precautions, NPO status , Patient's Chart, lab work & pertinent test results  History of Anesthesia Complications Negative for: history of anesthetic complications  Airway Mallampati: II  TM Distance: >3 FB Neck ROM: Full    Dental no notable dental hx. (+) Edentulous Upper, Edentulous Lower   Pulmonary former smoker   Pulmonary exam normal breath sounds clear to auscultation       Cardiovascular hypertension, Pt. on medications + CAD, + CABG and + Peripheral Vascular Disease  Normal cardiovascular exam+ Valvular Problems/Murmurs AS  Rhythm:Regular Rate:Normal  Echo 04/2023  1. Left ventricular ejection fraction, by estimation, is 70 to 75%. The left ventricle has hyperdynamic function. The left ventricle has no regional wall motion abnormalities. Left ventricular diastolic parameters are consistent with Grade I diastolic dysfunction (impaired relaxation).   2. Right ventricular systolic function is normal. The right ventricular size is normal. Tricuspid regurgitation signal is inadequate for assessing PA pressure.   3. Left atrial size was moderately dilated.   4. The mitral valve is normal in structure. Trivial mitral valve regurgitation. No evidence of mitral stenosis.   5. The aortic valve is tricuspid. There is severe calcifcation of the aortic valve. Aortic valve regurgitation is trivial. Aortic valve area, by VTI measures 0.98 cm. Aortic valve mean gradient measures 42.0 mmHg. Aortic valve Vmax measures 4.05 m/s. DVI is 0.28 consistent with moderate aortic valve stenosis but Vmax and PG across aortic valve consistent with severe aortic valve stenosis.   6. Aortic dilatation noted. There is borderline dilatation of the aortic root, measuring 37 mm. Normal size of ascending aorta.   7. The inferior vena cava is normal in  size with <50% respiratory variability, suggesting right atrial pressure of 8 mmHg.   Comparison(s): Changes from prior study are noted. Mean PG increased from 26 mm Hg to 42 mm Hg, V max worsened from 3.42 m/sec to 4.05 m/sec consistent with severe aortic valve stenosis. DVI worsened from 0.38 to 0.28, in moderate AS range but V max and PG consistent with severe AS.     TTE 02/2020: EF 60-65%, mild LVH, grade I diastolic dysfunction, mild AS, mild dilatation of aortic root  measuring 39mm   Neuro/Psych negative neurological ROS  negative psych ROS   GI/Hepatic negative GI ROS, Neg liver ROS,,,  Endo/Other  diabetes, Type 2, Oral Hypoglycemic Agents    Renal/GU Renal InsufficiencyRenal disease   hydrocele    Musculoskeletal  (+) Arthritis ,    Abdominal   Peds  Hematology negative hematology ROS (+)   Anesthesia Other Findings Day of surgery medications reviewed with patient.  Reproductive/Obstetrics                             Anesthesia Physical Anesthesia Plan  ASA: 4  Anesthesia Plan: MAC   Post-op Pain Management: Tylenol PO (pre-op)*   Induction: Intravenous  PONV Risk Score and Plan: 2 and Treatment may vary due to age or medical condition, Ondansetron, Dexamethasone and TIVA  Airway Management Planned: Natural Airway  Additional Equipment: Arterial line  Intra-op Plan:   Post-operative Plan:   Informed Consent: I have reviewed the patients History and Physical, chart, labs and discussed the procedure including the risks, benefits and alternatives for the proposed anesthesia with the patient or authorized representative who has indicated his/her understanding and acceptance.  Dental advisory given  Plan Discussed with: CRNA  Anesthesia Plan Comments: (2 x PIV  Risks of anesthesia explained at length. This includes, but is not limited to, sore throat, damage to teeth, lips gums, tongue and vocal cords, nausea and  vomiting, reactions to medications, stroke, heart attack, and death. All patient questions were answered and the patient wishes to proceed. )        Anesthesia Quick Evaluation

## 2023-08-12 NOTE — H&P (Signed)
[address]       [place] [address]             [phone]                                   BATTAL KETTERING [facility] Medical Record #[id number] Date of Birth: 1940-11-10   [name], MD Ignatius Specking, MD   Chief Complaint: increasing CP and SOB     History of Present Illness:     Pt is a very active and healthy 82 yo male who underwent a CABG in the past by DR Morton Peters who also had abdominal aortic aneurysm repair who has been suffering from what sounds like exertional angina with CP in left lower chest area that resolves with rest. Pt also with associated SOB. Pt had work up with TTE with severe AS with a mean gradient of and normal LV function. HE had cath with 2/4 patent grafts and confirmed at least moderate AS at cath. HE had CTA With appropriate anatomy and access for a 26 mm sapien valve. Pt is here to discuss TAVR options             Past Medical History:  Diagnosis Date   Arthritis     Bilateral hydrocele     CKD (chronic kidney disease), stage III (HCC)     Claudication of left lower extremity (HCC)      per pt when walks, go up stairs   Coronary artery disease cardiologist--- dr hochrein    s/p  severe 3V CAD,  CABG x3 02-09-2013; s/p AAA and bi-iliac bypass graft 02-09-2020   History of gout      09-07-2020  per pt has been several yrs, effects right great toe and trigger by asa   Hyperlipidemia     Hypertension     Occlusion and stenosis of bilateral carotid arteries      per last duplex in epic 05-05-2020 right ICA 40-59%, total occlusion left ICA   PVD (peripheral vascular disease) (HCC)      vascular --- dr Edilia Bo---  s/p  AAA repair and aorta bi-iliac bypass graft   S/P AAA (abdominal aortic aneurysm) repair 02/09/2020   S/P CABG x 3 02/09/2013    LIMA - LAD,  SVG -- RCA,  seqSVG -- OM1 and distal CFx   Severe aortic stenosis     Type 2 diabetes mellitus (HCC)      followed by pcp---- (09-07-2020 per pt checks  blood sugar in am dialy,  fasting sugar--- 118--130)   Wears glasses                 Past Surgical History:  Procedure Laterality Date   ABDOMINAL AORTIC ANEURYSM REPAIR N/A 02/09/2020    Procedure: ABDOMINAL AORTIC ANEURYSM REPAIR;  Surgeon: Chuck Hint, MD;  Location: [facility] OR;  Service: Vascular;  Laterality: N/A;   ANTERIOR CERVICAL DECOMP/DISCECTOMY FUSION   12-13-2008  @ [facility]    C5--6   AORTA - BILATERAL FEMORAL ARTERY BYPASS GRAFT Bilateral 02/09/2020    Procedure: AORTA BI-ILIAC  BYPASS GRAFT;  Surgeon: Chuck Hint, MD;  Location: [facility] OR;  Service: Vascular;  Laterality: Bilateral;   AORTIC ENDARTERECETOMY N/A 02/09/2020    Procedure: AORTIC ENDARTERECETOMY;  Surgeon: Chuck Hint, MD;  Location: [facility] OR;  Service: Vascular;  Laterality: N/A;   CARDIAC  CATHETERIZATION   02-05-2013  dr hochrein    severe 3V CAD, preserved EF   CARDIAC CATHETERIZATION   per pt 1993    had one blockage with no intervention per cardiology note   CORONARY ARTERY BYPASS GRAFT N/A 02/09/2013    Procedure: CORONARY ARTERY BYPASS GRAFTING (CABG);  Surgeon: Kerin Perna, MD;  Location: [facility] OR;  Service: Open Heart Surgery;  Laterality: N/A;  LIMA to the LAD, SVG to right coronary artery, sequential SVG to OM and distal circumflex.   HYDROCELE EXCISION Bilateral 09/09/2020    Procedure: BILATERAL HYDROCELECTOMY ADULT;  Surgeon: Bjorn Pippin, MD;  Location: [facility];  Service: Urology;  Laterality: Bilateral;   INTRAOPERATIVE TRANSESOPHAGEAL ECHOCARDIOGRAM N/A 02/09/2013    Procedure: INTRAOPERATIVE TRANSESOPHAGEAL ECHOCARDIOGRAM;  Surgeon: Kerin Perna, MD;  Location: [facility] OR;  Service: Open Heart Surgery;  Laterality: N/A;   LEFT HEART CATH AND CORS/GRAFTS ANGIOGRAPHY N/A 02/04/2023    Procedure: LEFT HEART CATH AND CORS/GRAFTS ANGIOGRAPHY;  Surgeon: Marykay Lex, MD;  Location: [facility] INVASIVE CV LAB;  Service: Cardiovascular;  Laterality: N/A;   LUMBAR FUSION    08-04-2007  @[facility]     L3 -- L5   REVERSE SHOULDER ARTHROPLASTY Right 06/25/2018    Procedure: RIGHT REVERSE SHOULDER ARTHROPLASTY;  Surgeon: Bjorn Pippin, MD;  Location: [facility] OR;  Service: Orthopedics;  Laterality: Right;   SCROTAL EXPLORATION Bilateral 10/07/2020    Procedure: SCROTUM EXPLORATION, DRAINAGE OF HYDOCELE;  Surgeon: Bjorn Pippin, MD;  Location: [facility];  Service: Urology;  Laterality: Bilateral;          Tobacco Use History  Social History        Tobacco Use  Smoking Status Former   Current packs/day: 0.00   Average packs/day: 3.0 packs/day for 40.0 years (120.0 ttl pk-yrs)   Types: Cigarettes   Start date: 05/27/1952   Quit date: 05/27/1992   Years since quitting: 31.2  Smokeless Tobacco Never      Social History        Substance and Sexual Activity  Alcohol Use Not Currently    Comment: QUIT 1993      Social History         Socioeconomic History   Marital status: Married      Spouse name: Not on file   Number of children: Not on file   Years of education: Not on file   Highest education level: Not on file  Occupational History   Not on file  Tobacco Use   Smoking status: Former      Current packs/day: 0.00      Average packs/day: 3.0 packs/day for 40.0 years (120.0 ttl pk-yrs)      Types: Cigarettes      Start date: 05/27/1952      Quit date: 05/27/1992      Years since quitting: 31.2   Smokeless tobacco: Never  Vaping Use   Vaping status: Never Used  Substance and Sexual Activity   Alcohol use: Not Currently      Comment: QUIT 1993   Drug use: Never   Sexual activity: Not on file  Other Topics Concern   Not on file  Social History Narrative    Lives at home with wife.               Social Determinants of Health    Financial Resource Strain: Not on file  Food Insecurity: Not on file  Transportation Needs: Not on file  Physical Activity:  Not on file  Stress: Not on file  Social Connections: Not on file  Intimate  Partner Violence: Not on file      Allergies       Allergies  Allergen Reactions   Cortisone Nausea And Vomiting   Statins Other (See Comments)      Pt states severe muscle pains.              Current Outpatient Medications  Medication Sig Dispense Refill   amLODipine (NORVASC) 2.5 MG tablet Take 1 tablet (2.5 mg total) by mouth daily. 30 tablet 11   aspirin EC 81 MG tablet Take 1 tablet (81 mg total) by mouth daily. Swallow whole. 90 tablet 3   benazepril (LOTENSIN) 20 MG tablet Take 20 mg by mouth 2 (two) times daily.       Evolocumab (REPATHA SURECLICK) 140 MG/ML SOAJ Inject 140 mg into the skin every 14 (fourteen) days. 6 mL 3   glipiZIDE (GLUCOTROL) 5 MG tablet Take 5 mg by mouth 2 (two) times daily.       ibuprofen (ADVIL) 200 MG tablet Take 400 mg by mouth every 8 (eight) hours as needed (pain.).       isosorbide mononitrate (IMDUR) 60 MG 24 hr tablet Take 1 tablet (60 mg total) by mouth daily. 30 tablet 11   nitroGLYCERIN (NITROSTAT) 0.4 MG SL tablet Place 1 tablet (0.4 mg total) under the tongue every 5 (five) minutes as needed for chest pain. 25 tablet PRN   rosuvastatin (CRESTOR) 10 MG tablet Take 1 tablet by mouth up to three times weekly as tolerated 36 tablet 3      No current facility-administered medications for this visit.               Family History  Problem Relation Age of Onset   Heart failure Mother 37   Cancer Sister                  Physical Exam: Lungs: Clear Card: RR with harsh systolic murmur Ext: Warm Neuro: alert Teeth in good repair         Diagnostic Studies & Laboratory data: I have personally reviewed the following studies and agree with the findings   TTE (20/2024) IMPRESSIONS     1. Left ventricular ejection fraction, by estimation, is 70 to 75%. The  left ventricle has hyperdynamic function. The left ventricle has no  regional wall motion abnormalities. Left ventricular diastolic parameters  are consistent with Grade I  diastolic  dysfunction (impaired relaxation).   2. Right ventricular systolic function is normal. The right ventricular  size is normal. Tricuspid regurgitation signal is inadequate for assessing  PA pressure.   3. Left atrial size was moderately dilated.   4. The mitral valve is normal in structure. Trivial mitral valve  regurgitation. No evidence of mitral stenosis.   5. The aortic valve is tricuspid. There is severe calcifcation of the  aortic valve. Aortic valve regurgitation is trivial. Aortic valve area, by  VTI measures 0.98 cm. Aortic valve mean gradient measures 42.0 mmHg.  Aortic valve Vmax measures 4.05 m/s.  DVI is 0.28 consistent with moderate aortic valve stenosis but Vmax and PG  across aortic valve consistent with severe aortic valve stenosis.   6. Aortic dilatation noted. There is borderline dilatation of the aortic  root, measuring 37 mm. Normal size of ascending aorta.   7. The inferior vena cava is normal in size with <50% respiratory  variability, suggesting right  atrial pressure of 8 mmHg.   Comparison(s): Changes from prior study are noted. Mean PG increased from  26 mm Hg to 42 mm Hg, V max worsened from 3.42 m/sec to 4.05 m/sec  consistent with severe aortic valve stenosis. DVI worsened from 0.38 to  0.28, in moderate AS range but V max and  PG consistent with severe AS.   FINDINGS   Left Ventricle: Left ventricular ejection fraction, by estimation, is 70  to 75%. The left ventricle has hyperdynamic function. The left ventricle  has no regional wall motion abnormalities. The left ventricular internal  cavity size was normal in size.  There is no left ventricular hypertrophy. Left ventricular diastolic  parameters are consistent with Grade I diastolic dysfunction (impaired  relaxation). Normal left ventricular filling pressure.   Right Ventricle: The right ventricular size is normal. No increase in  right ventricular wall thickness. Right ventricular  systolic function is  normal. Tricuspid regurgitation signal is inadequate for assessing PA  pressure.   Left Atrium: Left atrial size was moderately dilated.   Right Atrium: Right atrial size was normal in size.   Pericardium: There is no evidence of pericardial effusion.   Mitral Valve: The mitral valve is normal in structure. Trivial mitral  valve regurgitation. No evidence of mitral valve stenosis. MV peak  gradient, 6.9 mmHg. The mean mitral valve gradient is 4.0 mmHg.   Tricuspid Valve: The tricuspid valve is normal in structure. Tricuspid  valve regurgitation is not demonstrated. No evidence of tricuspid  stenosis.   Aortic Valve: The aortic valve is tricuspid. There is severe calcifcation  of the aortic valve. Aortic valve regurgitation is trivial. Severe aortic  stenosis is present. Aortic valve mean gradient measures 42.0 mmHg. Aortic  valve peak gradient measures  65.6 mmHg. Aortic valve area, by VTI measures 0.98 cm.   Pulmonic Valve: The pulmonic valve was not well visualized. Pulmonic valve  regurgitation is trivial. No evidence of pulmonic stenosis.   Aorta: Aortic dilatation noted. There is borderline dilatation of the  aortic root, measuring 37 mm.   Venous: The inferior vena cava is normal in size with less than 50%  respiratory variability, suggesting right atrial pressure of 8 mmHg.   IAS/Shunts: No atrial level shunt detected by color flow Doppler.     LEFT VENTRICLE  PLAX 2D  LVIDd:         4.40 cm     Diastology  LVIDs:         2.20 cm     LV e' medial:    6.85 cm/s  LV PW:         1.10 cm     LV E/e' medial:  11.9  LV IVS:        1.10 cm     LV e' lateral:   7.51 cm/s  LVOT diam:     2.00 cm     LV E/e' lateral: 10.8  LV SV:         85  LV SV Index:   39  LVOT Area:     3.14 cm    LV Volumes (MOD)  LV vol d, MOD A2C: 80.0 ml  LV vol d, MOD A4C: 62.8 ml  LV vol s, MOD A2C: 30.0 ml  LV vol s, MOD A4C: 25.2 ml  LV SV MOD A2C:     50.0 ml  LV  SV MOD A4C:     62.8 ml  LV SV MOD BP:  46.9 ml   RIGHT VENTRICLE  RV Basal diam:  3.10 cm  RV Mid diam:    3.70 cm  RV S prime:     14.90 cm/s  TAPSE (M-mode): 2.4 cm   LEFT ATRIUM             Index        RIGHT ATRIUM           Index  LA diam:        3.80 cm 1.74 cm/m   RA Area:     17.30 cm  LA Vol (A2C):   98.9 ml 45.39 ml/m  RA Volume:   42.50 ml  19.51 ml/m  LA Vol (A4C):   68.3 ml 31.35 ml/m  LA Biplane Vol: 88.0 ml 40.39 ml/m   AORTIC VALVE                     PULMONIC VALVE  AV Area (Vmax):    0.87 cm      PV Vmax:       1.19 m/s  AV Area (Vmean):   0.98 cm      PV Peak grad:  5.7 mmHg  AV Area (VTI):     0.98 cm  AV Vmax:           405.00 cm/s  AV Vmean:          282.750 cm/s  AV VTI:            0.870 m  AV Peak Grad:      65.6 mmHg  AV Mean Grad:      42.0 mmHg  LVOT Vmax:         112.00 cm/s  LVOT Vmean:        88.033 cm/s  LVOT VTI:          0.272 m  LVOT/AV VTI ratio: 0.31    AORTA  Ao Root diam: 3.70 cm  Ao Asc diam:  3.50 cm   MITRAL VALVE  MV Area (PHT): 2.78 cm     SHUNTS  MV Area VTI:   2.48 cm     Systemic VTI:  0.27 m  MV Peak grad:  6.9 mmHg     Systemic Diam: 2.00 cm  MV Mean grad:  4.0 mmHg  MV Vmax:       1.31 m/s  MV Vmean:      88.7 cm/s  MV Decel Time: 273 msec  MV E velocity: 81.40 cm/s  MV A velocity: 108.00 cm/s  MV E/A ratio:  0.75    CATH (01/2023) Conclusion       ------NATIVE CORONARIES----------   Suezanne Jacquet LM to Ost LAD lesion is 70% stenosed with 60% stenosed side branch in Ost Cx.   Ost LAD to Prox LAD lesion is 99% stenosed.  Prox LAD to Mid LAD lesion is 100% stenosed.AFTER 1st Diag & 1st Sep   Remainder of LCx is patent/normal; 1st Mrg lesion is 100% stenosed.  Grafted 2nd Mrg lesion is 100% stenosed.   Ost RCA to Prox RCA lesion is 100% stenosed.   ------GRAFTS----------   SVG-dRCA  graft was visualized by angiography and is normal in caliber.  Origin lesion is 100% stenosed.   Seq SVG- OM-dLCx graft was  visualized by angiography and is normal in caliber.  Prox Graft to Dist Graft lesion between 2nd Mrg and 1st LPL is 100% stenosed.   LIM-LAD graft was visualized by angiography and is normal in  caliber.  The graft exhibits no disease.   ---------HEMODYNAMICS-----------   There isat least MODERATE AORTIC VALVE STENOSIS. -Heavily calcified with severe jet.  Mean gradient is between 23.6-33 5 mmHg.   LV end diastolic pressure is mildly elevated.     SUMMARY Severe Native Vessel CAD with 2 of 4 patent Grafts: Ost-distal LM 70% with 99% ostial LAD & 65% Ost LCx LAD 100% occluded after D1 & SP1 LCx is a Large (? codominant) artery with a small OM1 & likely occluded Om2 (grafted) & terminates as a ~ distal LPL /PDA that has the appearance of a stump from the seqential limb of SVG-OM-dLCx   Heavily Calcified Ostial RCA -very difficult to engage (due to aortic jet), appears to be 100% flush occluded Patent LIMA to LAD with retrograde filling D2 Patent proximal limb of Seq SVG-OM-dLCx, Occluded sequential limb 100% flush occluded SVG-RCA At Least Moderate Aortic Stenosis by direct measurement, however based on how difficult the valve was across and the severity of the aortic whip, could be more significant. Recommend reassessment of 2D Echo. =  Mean gradient was either 23.6 mm or 33.4 mm depending on measurement. Minimally elevated LVEDP of 14 to 15 mmHg.     Recent Radiology Findings:   CTA (07/2023) FINDINGS: Cardiovascular: Atheromatous calcifications. See findings discussed in the body of the report.   Mediastinum/Nodes: No suspicious adenopathy identified. Imaged mediastinal structures are unremarkable.   Lungs/Pleura: Imaged lungs are clear. No pleural effusion or pneumothorax.   Upper Abdomen: No acute abnormality.   Musculoskeletal: No chest wall abnormality. No acute osseous findings.   IMPRESSION: No acute extracardiac incidental findings.     Electronically Signed   By:  Layla Maw M.D.   On: 08/01/2023 12:20    Addended by Domenic Polite, MD on 08/01/2023 12:23 PM    Study Result   Narrative & Impression  CLINICAL DATA:  Aortic Stenosis   EXAM: Cardiac TAVR CT   TECHNIQUE: The patient was scanned on a Siemens Force 192 slice scanner. A 120 kV retrospective scan was triggered in the ascending thoracic aorta at 140 HU's. Gantry rotation speed was 250 msecs and collimation was .6 mm. No beta blockade or nitro were given. The 3D data set was reconstructed in 5% intervals of the R-R cycle. Systolic and diastolic phases were analyzed on a dedicated work station using MPR, MIP and VRT modes. The patient received 80 cc of contrast.   FINDINGS: Aortic Valve: Heavily calcified tri leaflet AV with score 3281   Aorta: Severe calcific atherosclerosis no aneurysm normal arch vessels   Sino-tubular Junction: Calcified 28.5 mm   Ascending Thoracic Aorta: 34 mm   Aortic Arch: 28 mm   Descending Thoracic Aorta: Tortuous 30 mm   Sinus of Valsalva Measurements:   Non-coronary: 36.9 mm  Height 28 mm   Right - coronary: 37.9 mm  Height 26.3 mm   Left -   coronary: 36.8 mm  Height 23.6 mm   Coronary Artery Height above Annulus:   Left Main: 17.8 mm above annulus   Right Coronary: RCA 22.4 mm above annulus   Virtual Basal Annulus Measurements:   Maximum / Minimum Diameter: 27.5 mm x 20.6 mm Average diameter 24.3 mm   Perimeter: 78.4 mm   Area: 464 mm2   Coronary Arteries: Sufficient height above annulus for deployment   Optimum Fluoroscopic Angle for Delivery: LAO 12 Caudal 5 degrees   Membranous septal length 6 mm   IMPRESSION: 1.  Calcified  tri leaflet AV with score 3281   2. Annular area of 464 mm2 suitable for a 26 mm Sapien valve. Given dense nodular calcium at base of non coronary cusp would not likely recommend self expanding valve   3. Coronary arteries sufficient height above annulus for deployment Occluded native  RCA and occluded SVG to RCA. Patient LIMA to LAD and patent SVG to OM   4.  Membranous septal length 6 mm   5. Optimum angiographic angle for deployment LAO 12 Caudal 5 degrees   6. Severe calcific atherosclerosis of aorta with tortuous descending thoracic aorta            Recent Lab Findings: Recent Labs       Lab Results  Component Value Date    WBC 6.4 02/04/2023    HGB 14.4 02/04/2023    HCT 44.5 02/04/2023    PLT 149 (L) 02/04/2023    GLUCOSE 122 (H) 07/08/2023    CHOL 210 (H) 02/06/2013    TRIG 377 (H) 02/06/2013    HDL 32 (L) 02/06/2013    LDLCALC 103 (H) 02/06/2013    ALT 15 02/10/2020    AST 22 02/10/2020    NA 140 07/08/2023    K 4.5 07/08/2023    CL 104 07/08/2023    CREATININE 1.18 07/08/2023    BUN 13 07/08/2023    CO2 22 07/08/2023    INR 1.1 02/09/2020    HGBA1C 5.9 (H) 02/04/2023            Assessment / Plan:     Pt is an 82 yo male with NYHA class 2 symptoms of severe AS with normal LV function and 2/4 occluded grafts from previous CABG. With his age, previous CABG and AAA repair, he would do better with TAVR and we discussed the risks and goals and recovery from TAVR and he wishes to proceed. He would be a bail out candidate

## 2023-08-13 ENCOUNTER — Encounter (HOSPITAL_COMMUNITY): Payer: Self-pay | Admitting: Internal Medicine

## 2023-08-13 ENCOUNTER — Other Ambulatory Visit: Payer: Self-pay | Admitting: Physician Assistant

## 2023-08-13 ENCOUNTER — Inpatient Hospital Stay (HOSPITAL_COMMUNITY): Payer: Self-pay | Admitting: Physician Assistant

## 2023-08-13 ENCOUNTER — Other Ambulatory Visit: Payer: Self-pay

## 2023-08-13 ENCOUNTER — Inpatient Hospital Stay (HOSPITAL_COMMUNITY)
Admission: RE | Admit: 2023-08-13 | Discharge: 2023-08-14 | DRG: 266 | Disposition: A | Discharge status: Home / Self Care | Payer: Medicare HMO | Attending: Internal Medicine | Admitting: Internal Medicine

## 2023-08-13 ENCOUNTER — Encounter (HOSPITAL_COMMUNITY)
Admission: RE | Disposition: A | Discharge status: Home / Self Care | Payer: Self-pay | Source: Home / Self Care | Attending: Internal Medicine

## 2023-08-13 ENCOUNTER — Inpatient Hospital Stay (HOSPITAL_COMMUNITY): Payer: Medicare HMO | Admitting: Anesthesiology

## 2023-08-13 ENCOUNTER — Inpatient Hospital Stay (HOSPITAL_COMMUNITY): Payer: Medicare HMO

## 2023-08-13 DIAGNOSIS — E669 Obesity, unspecified: Secondary | ICD-10-CM | POA: Diagnosis present

## 2023-08-13 DIAGNOSIS — N183 Chronic kidney disease, stage 3 unspecified: Secondary | ICD-10-CM | POA: Diagnosis present

## 2023-08-13 DIAGNOSIS — I6523 Occlusion and stenosis of bilateral carotid arteries: Secondary | ICD-10-CM | POA: Diagnosis not present

## 2023-08-13 DIAGNOSIS — E1151 Type 2 diabetes mellitus with diabetic peripheral angiopathy without gangrene: Secondary | ICD-10-CM | POA: Diagnosis present

## 2023-08-13 DIAGNOSIS — N182 Chronic kidney disease, stage 2 (mild): Secondary | ICD-10-CM | POA: Diagnosis present

## 2023-08-13 DIAGNOSIS — Z952 Presence of prosthetic heart valve: Secondary | ICD-10-CM | POA: Diagnosis not present

## 2023-08-13 DIAGNOSIS — E785 Hyperlipidemia, unspecified: Secondary | ICD-10-CM | POA: Diagnosis not present

## 2023-08-13 DIAGNOSIS — I447 Left bundle-branch block, unspecified: Secondary | ICD-10-CM | POA: Diagnosis not present

## 2023-08-13 DIAGNOSIS — I779 Disorder of arteries and arterioles, unspecified: Secondary | ICD-10-CM | POA: Diagnosis present

## 2023-08-13 DIAGNOSIS — Z7984 Long term (current) use of oral hypoglycemic drugs: Secondary | ICD-10-CM

## 2023-08-13 DIAGNOSIS — Z7982 Long term (current) use of aspirin: Secondary | ICD-10-CM

## 2023-08-13 DIAGNOSIS — M109 Gout, unspecified: Secondary | ICD-10-CM | POA: Diagnosis not present

## 2023-08-13 DIAGNOSIS — I472 Ventricular tachycardia, unspecified: Secondary | ICD-10-CM | POA: Diagnosis not present

## 2023-08-13 DIAGNOSIS — Z6830 Body mass index (BMI) 30.0-30.9, adult: Secondary | ICD-10-CM

## 2023-08-13 DIAGNOSIS — Z79899 Other long term (current) drug therapy: Secondary | ICD-10-CM

## 2023-08-13 DIAGNOSIS — I25810 Atherosclerosis of coronary artery bypass graft(s) without angina pectoris: Secondary | ICD-10-CM | POA: Diagnosis not present

## 2023-08-13 DIAGNOSIS — I2510 Atherosclerotic heart disease of native coronary artery without angina pectoris: Secondary | ICD-10-CM | POA: Diagnosis not present

## 2023-08-13 DIAGNOSIS — Z87891 Personal history of nicotine dependence: Secondary | ICD-10-CM | POA: Diagnosis not present

## 2023-08-13 DIAGNOSIS — I350 Nonrheumatic aortic (valve) stenosis: Secondary | ICD-10-CM | POA: Diagnosis not present

## 2023-08-13 DIAGNOSIS — Z95828 Presence of other vascular implants and grafts: Secondary | ICD-10-CM

## 2023-08-13 DIAGNOSIS — Z96611 Presence of right artificial shoulder joint: Secondary | ICD-10-CM | POA: Diagnosis present

## 2023-08-13 DIAGNOSIS — I5033 Acute on chronic diastolic (congestive) heart failure: Secondary | ICD-10-CM | POA: Diagnosis not present

## 2023-08-13 DIAGNOSIS — I440 Atrioventricular block, first degree: Secondary | ICD-10-CM | POA: Diagnosis not present

## 2023-08-13 DIAGNOSIS — Z8249 Family history of ischemic heart disease and other diseases of the circulatory system: Secondary | ICD-10-CM

## 2023-08-13 DIAGNOSIS — D696 Thrombocytopenia, unspecified: Secondary | ICD-10-CM | POA: Diagnosis not present

## 2023-08-13 DIAGNOSIS — I9581 Postprocedural hypotension: Secondary | ICD-10-CM | POA: Diagnosis not present

## 2023-08-13 DIAGNOSIS — R001 Bradycardia, unspecified: Secondary | ICD-10-CM | POA: Diagnosis not present

## 2023-08-13 DIAGNOSIS — E1122 Type 2 diabetes mellitus with diabetic chronic kidney disease: Secondary | ICD-10-CM | POA: Diagnosis not present

## 2023-08-13 DIAGNOSIS — Z006 Encounter for examination for normal comparison and control in clinical research program: Secondary | ICD-10-CM | POA: Diagnosis not present

## 2023-08-13 DIAGNOSIS — I714 Abdominal aortic aneurysm, without rupture, unspecified: Secondary | ICD-10-CM | POA: Diagnosis present

## 2023-08-13 DIAGNOSIS — Z981 Arthrodesis status: Secondary | ICD-10-CM

## 2023-08-13 DIAGNOSIS — E119 Type 2 diabetes mellitus without complications: Secondary | ICD-10-CM | POA: Diagnosis not present

## 2023-08-13 DIAGNOSIS — I10 Essential (primary) hypertension: Secondary | ICD-10-CM | POA: Diagnosis present

## 2023-08-13 DIAGNOSIS — I130 Hypertensive heart and chronic kidney disease with heart failure and stage 1 through stage 4 chronic kidney disease, or unspecified chronic kidney disease: Secondary | ICD-10-CM | POA: Diagnosis present

## 2023-08-13 DIAGNOSIS — Z95820 Peripheral vascular angioplasty status with implants and grafts: Secondary | ICD-10-CM

## 2023-08-13 DIAGNOSIS — Z8679 Personal history of other diseases of the circulatory system: Secondary | ICD-10-CM

## 2023-08-13 DIAGNOSIS — E118 Type 2 diabetes mellitus with unspecified complications: Secondary | ICD-10-CM | POA: Diagnosis present

## 2023-08-13 HISTORY — DX: Presence of prosthetic heart valve: Z95.2

## 2023-08-13 LAB — POCT I-STAT, CHEM 8
BUN: 23 mg/dL (ref 8–23)
BUN: 22 mg/dL (ref 8–23)
Calcium, Ion: 1.26 mmol/L (ref 1.15–1.40)
Calcium, Ion: 1.19 mmol/L (ref 1.15–1.40)
Chloride: 103 mmol/L (ref 98–111)
Chloride: 104 mmol/L (ref 98–111)
Creatinine, Ser: 1.2 mg/dL (ref 0.61–1.24)
Creatinine, Ser: 1.2 mg/dL (ref 0.61–1.24)
Glucose, Bld: 187 mg/dL — ABNORMAL HIGH (ref 70–99)
Glucose, Bld: 156 mg/dL — ABNORMAL HIGH (ref 70–99)
HCT: 38 % — ABNORMAL LOW (ref 39.0–52.0)
HCT: 38 % — ABNORMAL LOW (ref 39.0–52.0)
Hemoglobin: 12.9 g/dL — ABNORMAL LOW (ref 13.0–17.0)
Hemoglobin: 12.9 g/dL — ABNORMAL LOW (ref 13.0–17.0)
Potassium: 4.5 mmol/L (ref 3.5–5.1)
Potassium: 4.3 mmol/L (ref 3.5–5.1)
Sodium: 138 mmol/L (ref 135–145)
Sodium: 139 mmol/L (ref 135–145)
TCO2: 26 mmol/L (ref 22–32)
TCO2: 21 mmol/L — ABNORMAL LOW (ref 22–32)

## 2023-08-13 LAB — ECHOCARDIOGRAM LIMITED
AR max vel: 3.65 cm2
AV Area VTI: 4.08 cm2
AV Area mean vel: 3.25 cm2
AV Mean grad: 4 mm[Hg]
AV Peak grad: 7.3 mm[Hg]
Ao pk vel: 1.35 m/s

## 2023-08-13 LAB — GLUCOSE, CAPILLARY
Glucose-Capillary: 68 mg/dL — ABNORMAL LOW (ref 70–99)
Glucose-Capillary: 98 mg/dL (ref 70–99)
Glucose-Capillary: 129 mg/dL — ABNORMAL HIGH (ref 70–99)
Glucose-Capillary: 113 mg/dL — ABNORMAL HIGH (ref 70–99)

## 2023-08-13 LAB — POCT ACTIVATED CLOTTING TIME: Activated Clotting Time: 118 s

## 2023-08-13 SURGERY — TRANSCATHETER AORTIC VALVE REPLACEMENT, TRANSFEMORAL (CATHLAB)
Anesthesia: Monitor Anesthesia Care

## 2023-08-13 MED ORDER — HEPARIN (PORCINE) IN NACL 1000-0.9 UT/500ML-% IV SOLN
INTRAVENOUS | Status: DC | PRN
  Administered 2023-08-13 (×2): 500 mL

## 2023-08-13 MED ORDER — INSULIN ASPART 100 UNIT/ML IJ SOLN
0.0000 [IU] | Freq: Three times a day (TID) | INTRAMUSCULAR | Status: DC
  Administered 2023-08-13 – 2023-08-14 (×2): 2 [IU] via SUBCUTANEOUS

## 2023-08-13 MED ORDER — LIDOCAINE HCL (PF) 1 % IJ SOLN
INTRAMUSCULAR | Status: DC | PRN
  Administered 2023-08-13 (×2): 12 mL

## 2023-08-13 MED ORDER — TRAMADOL HCL 50 MG PO TABS: 50.0000 mg | ORAL_TABLET | ORAL | Status: DC | PRN

## 2023-08-13 MED ORDER — OXYCODONE HCL 5 MG PO TABS: 5.0000 mg | ORAL_TABLET | ORAL | Status: DC | PRN

## 2023-08-13 MED ORDER — SODIUM CHLORIDE 0.9 % IV SOLN
INTRAVENOUS | Status: DC
Start: 2023-08-13 — End: 2023-08-13

## 2023-08-13 MED ORDER — NITROGLYCERIN IN D5W 200-5 MCG/ML-% IV SOLN: 0.0000 ug/min | INTRAVENOUS | Status: DC

## 2023-08-13 MED ORDER — ACETAMINOPHEN 325 MG PO TABS: 650.0000 mg | ORAL_TABLET | Freq: Four times a day (QID) | ORAL | Status: DC | PRN

## 2023-08-13 MED ORDER — CHLORHEXIDINE GLUCONATE 4 % EX SOLN
60.0000 mL | Freq: Once | CUTANEOUS | Status: DC
Start: 2023-08-13 — End: 2023-08-13

## 2023-08-13 MED ORDER — ASPIRIN 81 MG PO TBEC
81.0000 mg | DELAYED_RELEASE_TABLET | Freq: Every day | ORAL | Status: DC
  Administered 2023-08-13 – 2023-08-14 (×2): 81 mg via ORAL
  Filled 2023-08-13 (×2): qty 1

## 2023-08-13 MED ORDER — ONDANSETRON HCL 4 MG/2ML IJ SOLN
INTRAMUSCULAR | Status: DC | PRN
  Administered 2023-08-13: 4 mg via INTRAVENOUS

## 2023-08-13 MED ORDER — SODIUM CHLORIDE 0.9 % IV SOLN: INTRAVENOUS | Status: AC

## 2023-08-13 MED ORDER — INSULIN ASPART 100 UNIT/ML IJ SOLN
0.0000 [IU] | INTRAMUSCULAR | Status: DC | PRN
Start: 2023-08-13 — End: 2023-08-13

## 2023-08-13 MED ORDER — CHLORHEXIDINE GLUCONATE 0.12 % MT SOLN
15.0000 mL | Freq: Once | OROMUCOSAL | Status: AC
  Administered 2023-08-13: 15 mL via OROMUCOSAL
  Filled 2023-08-13: qty 15

## 2023-08-13 MED ORDER — PROPOFOL 500 MG/50ML IV EMUL
INTRAVENOUS | Status: DC | PRN
  Administered 2023-08-13: 20 mg via INTRAVENOUS
  Administered 2023-08-13: 10 ug/kg/min via INTRAVENOUS
  Administered 2023-08-13: 20 mg via INTRAVENOUS

## 2023-08-13 MED ORDER — PROTAMINE SULFATE 10 MG/ML IV SOLN
INTRAVENOUS | Status: DC | PRN
  Administered 2023-08-13: 140 mg via INTRAVENOUS

## 2023-08-13 MED ORDER — FENTANYL CITRATE (PF) 100 MCG/2ML IJ SOLN
INTRAMUSCULAR | Status: AC
  Filled 2023-08-13: qty 2

## 2023-08-13 MED ORDER — ACETAMINOPHEN 650 MG RE SUPP: 650.0000 mg | Freq: Four times a day (QID) | RECTAL | Status: DC | PRN

## 2023-08-13 MED ORDER — SODIUM CHLORIDE 0.9% FLUSH: 3.0000 mL | INTRAVENOUS | Status: DC | PRN

## 2023-08-13 MED ORDER — CHLORHEXIDINE GLUCONATE 4 % EX SOLN: 60.0000 mL | Freq: Once | CUTANEOUS | Status: DC

## 2023-08-13 MED ORDER — ONDANSETRON HCL 4 MG/2ML IJ SOLN: 4.0000 mg | Freq: Four times a day (QID) | INTRAMUSCULAR | Status: DC | PRN

## 2023-08-13 MED ORDER — FENTANYL CITRATE (PF) 100 MCG/2ML IJ SOLN
INTRAMUSCULAR | Status: DC | PRN
  Administered 2023-08-13: 25 ug via INTRAVENOUS

## 2023-08-13 MED ORDER — LIDOCAINE HCL (PF) 1 % IJ SOLN
INTRAMUSCULAR | Status: AC
  Filled 2023-08-13: qty 30

## 2023-08-13 MED ORDER — SODIUM CHLORIDE 0.9 % IV SOLN: INTRAVENOUS | Status: DC

## 2023-08-13 MED ORDER — CHLORHEXIDINE GLUCONATE 4 % EX SOLN: 30.0000 mL | CUTANEOUS | Status: DC

## 2023-08-13 MED ORDER — IODIXANOL 320 MG/ML IV SOLN
INTRAVENOUS | Status: DC | PRN
  Administered 2023-08-13: 120 mL via INTRA_ARTERIAL

## 2023-08-13 MED ORDER — MORPHINE SULFATE (PF) 2 MG/ML IV SOLN: 1.0000 mg | INTRAVENOUS | Status: DC | PRN

## 2023-08-13 MED ORDER — CEFAZOLIN SODIUM-DEXTROSE 2-4 GM/100ML-% IV SOLN
2.0000 g | Freq: Three times a day (TID) | INTRAVENOUS | Status: AC
  Administered 2023-08-13 (×2): 2 g via INTRAVENOUS
  Filled 2023-08-13 (×2): qty 100

## 2023-08-13 MED ORDER — SODIUM CHLORIDE 0.9% FLUSH: 3.0000 mL | Freq: Two times a day (BID) | INTRAVENOUS | Status: DC

## 2023-08-13 MED ORDER — HEPARIN SODIUM (PORCINE) 1000 UNIT/ML IJ SOLN
INTRAMUSCULAR | Status: DC | PRN
  Administered 2023-08-13: 14000 [IU] via INTRAVENOUS

## 2023-08-13 MED ORDER — SODIUM CHLORIDE 0.9 % IV SOLN: 250.0000 mL | INTRAVENOUS | Status: DC | PRN

## 2023-08-13 MED ORDER — ATROPINE SULFATE 1 MG/10ML IJ SOSY
PREFILLED_SYRINGE | INTRAMUSCULAR | Status: AC
  Filled 2023-08-13: qty 10

## 2023-08-13 SURGICAL SUPPLY — 30 items
BAG SNAP BAND KOVER 36X36 (MISCELLANEOUS) ×2 IMPLANT
CABLE SURGICAL S-101-97-12 (CABLE) IMPLANT
CATH 26 ULTRA DELIVERY (CATHETERS) IMPLANT
CATH CROSS OVER TEMPO 5F (CATHETERS) IMPLANT
CATH DIAG 6FR PIGTAIL ANGLED (CATHETERS) IMPLANT
CATH INFINITI 5FR ANG PIGTAIL (CATHETERS) IMPLANT
CATH INFINITI 6F AL1 (CATHETERS) IMPLANT
CATH INFINITI 6F AL2 (CATHETERS) IMPLANT
CATH INFINITI JR4 5F (CATHETERS) IMPLANT
CLOSURE PERCLOSE PROSTYLE (VASCULAR PRODUCTS) IMPLANT
CRIMPER (MISCELLANEOUS) IMPLANT
DEVICE INFLATION ATRION QL2530 (MISCELLANEOUS) IMPLANT
ELECT DEFIB PAD ADLT CADENCE (PAD) IMPLANT
KIT SAPIAN 3 ULTRA RESILIA 26 (Valve) IMPLANT
KIT SYRINGE INJ CVI SPIKEX1 (MISCELLANEOUS) IMPLANT
PACK CARDIAC CATHETERIZATION (CUSTOM PROCEDURE TRAY) ×1 IMPLANT
SET ATX-X65L (MISCELLANEOUS) IMPLANT
SHEATH INTRODUCER SET 20-26 (SHEATH) IMPLANT
SHEATH PINNACLE 6F 10CM (SHEATH) IMPLANT
SHEATH PINNACLE 8F 10CM (SHEATH) IMPLANT
SHEATH PROBE COVER 6X72 (BAG) IMPLANT
STOPCOCK MORSE 400PSI 3WAY (MISCELLANEOUS) ×2 IMPLANT
TRANSDUCER W/STOPCOCK (MISCELLANEOUS) IMPLANT
TUBING ART PRESS 72 MALE/FEM (TUBING) IMPLANT
WIRE AMPLATZ SS-J .035X180CM (WIRE) IMPLANT
WIRE EMERALD 3MM-J .035X150CM (WIRE) IMPLANT
WIRE EMERALD 3MM-J .035X260CM (WIRE) IMPLANT
WIRE EMERALD ST .035X260CM (WIRE) IMPLANT
WIRE MICRO SET SILHO 5FR 7 (SHEATH) IMPLANT
WIRE SAFARI SM CURVE 275 (WIRE) IMPLANT

## 2023-08-13 NOTE — Op Note (Signed)
[facility]   MULTIDISCIPLINARY HEART VALVE TEAM   TAVR OPERATIVE NOTE   Date of Procedure:  08/13/2023  Preoperative Diagnosis: Severe Aortic Stenosis   Postoperative Diagnosis: Same   Procedure:   Transcatheter Aortic Valve Replacement - Percutaneous left Transfemoral Approach  Edwards Sapien 3 Ultra THV (size 26 mm, model # 9755RSL)   Co-Surgeons:  Eugenio Hoes MD and Alverda Skeans, MD   Anesthesiologist:  Lewie Loron MD  Echocardiographer:  Charlton Haws MD  Pre-operative Echo Findings: Severe aortic stenosis Normal left ventricular systolic function  Post-operative Echo Findings: no paravalvular leak normal left ventricular systolic function   BRIEF CLINICAL NOTE AND INDICATIONS FOR SURGERY  Pt is an 82 yo male with NYHA class 2 symptoms of severe AS with normal LV function and 2/4 occluded grafts from previous CABG. With his age, previous CABG and AAA repair, he would do better with TAVR and we discussed the risks and goals and recovery from TAVR and he wishes to proceed.     DETAILS OF THE OPERATIVE PROCEDURE  PREPARATION:    The patient was brought to the operating room on the above mentioned date and appropriate monitoring was established by the anesthesia team. The patient was placed in the supine position on the operating table.  Intravenous antibiotics were administered. The patient was monitored closely throughout the procedure under conscious sedation. Baseline transthoracic echocardiogram was performed. The patient's abdomen and both groins were prepped and draped in a sterile manner. A time out procedure was performed.   PERIPHERAL ACCESS:    Using the modified Seldinger technique, femoral arterial and venous access was obtained with placement of 6 Fr sheaths on the left side.  A pigtail diagnostic catheter was passed through the right arterial sheath under fluoroscopic guidance into the aortic root.    Aortic root angiography was  performed in order to determine the optimal angiographic angle for valve deployment.   TRANSFEMORAL ACCESS:   Percutaneous transfemoral access and sheath placement was performed using ultrasound guidance.  The left common femoral artery was cannulated using a micropuncture needle and appropriate location was verified using hand injection angiogram.  A pair of Abbott Perclose percutaneous closure devices were placed and a 6 French sheath replaced into the femoral artery.  The patient was heparinized systemically and ACT verified > 250 seconds.    A 14 Fr transfemoral E-sheath was introduced into the left common femoral artery after progressively dilating over an Amplatz superstiff wire. An AL1 catheter was used to direct a straight-tip exchange length wire across the native aortic valve into the left ventricle. This was exchanged out for a pigtail catheter and position was confirmed in the LV apex. Simultaneous LV and Ao pressures were recorded.  The pigtail catheter was exchanged for a Safari wire in the LV apex. This was used for direct LV pacing and the pacemaker was tested to ensure stable lead placement and pacemaker capture.  BALLOON AORTIC VALVULOPLASTY:   Not performed   TRANSCATHETER HEART VALVE DEPLOYMENT:   An Edwards Sapien 3 Ultra transcatheter heart valve (size 26 mm) was prepared and crimped per manufacturer's guidelines, and the proper orientation of the valve is confirmed on the [employer] delivery system. The valve was advanced through the introducer sheath using normal technique until in an appropriate position in the abdominal aorta beyond the sheath tip. The balloon was then retracted and using the fine-tuning wheel was centered on the valve. The valve was then advanced across the aortic arch using  appropriate flexion of the catheter. The valve was carefully positioned across the aortic valve annulus. The Commander catheter was retracted using normal technique. Once final  position of the valve has been confirmed by angiographic assessment, the valve is deployed during rapid ventricular pacing to maintain systolic blood pressure < 50 mmHg and pulse pressure < 10 mmHg. The balloon inflation is held for >3 seconds after reaching full deployment volume. Once the balloon has fully deflated the balloon is retracted into the ascending aorta and valve function is assessed using echocardiography. There is felt to be no paravalvular leak and no central aortic insufficiency.  The patient's hemodynamic recovery following valve deployment is good.  The deployment balloon and guidewire are both removed.    PROCEDURE COMPLETION:   The sheath was removed and femoral artery closure performed.  Protamine was administered once femoral arterial repair was complete. The temporary pacemaker, pigtail catheter and femoral sheaths were removed with manual pressure used for venous hemostasis. The patient tolerated the procedure well and is transported to the cath lab recovery area in stable condition. There were no immediate intraoperative complications. All sponge instrument and needle counts are verified correct at completion of the operation. Manual pressure was used to control the right arterial puncture site  No blood products were administered during the operation.  The patient received a total of 60 mL of intravenous contrast during the procedure.   Eugenio Hoes, MD 08/13/2023 9:45 AM

## 2023-08-13 NOTE — Progress Notes (Signed)
Made Carlean Jews aware that the patient's BP remains in the 70's systolic post Normal Saline Bolus. Patient is still alert and oriented with intact neuro checks. He does state that he 'fells restless and needs to move." Florentina Addison also made aware of that. She ordered another Normal Saline bolus.

## 2023-08-13 NOTE — Progress Notes (Signed)
Patient's BP is now 83/54 post the second Normal Saline bolus. Patient remains stable, alert and oriented x4, and intact unchanged neuro checks. Made Carlean Jews aware. No new orders given.

## 2023-08-13 NOTE — Progress Notes (Signed)
  [facility]   MULTIDISCIPLINARY HEART VALVE TEAM  Patient doing well s/p TAVR. He he is hemodynamically stable but BPs running on soft side. He has required levofed which has been weaned down. Will stop it now. Plan to give a small 250 cc bolus. Groin sites stable. ECG with sinus brady with new LBBB and 1st deg block (had previously) but no high grade block. Tele shows HR with sinus in 40s. I think BP/HR will improve as he wakes up more. Plan to transfer to from cath lab holding to 4E when bed available. Early ambulation after bedrest completed and hopeful discharge over the next 24-48 hours.   Cline Crock PA-C  [facility]  Pager [phone]

## 2023-08-13 NOTE — Progress Notes (Signed)
Patient received from cath lab after TAVR.  Right and left groin sites level 0.  Telemetry monitor applied and CCMD notified.  CHG and skin assessment completed.  Patient oriented to unit and room to include call light and phone.

## 2023-08-13 NOTE — Progress Notes (Signed)
Patient arrived to holding area post TAVR. Patient's BP 80/44. CRNA in room and restarted Levophed drip at 82mcg/min. BP raised to greater than 150 systolic. Levophed titrated back down and then off. Then at 10am BP dropped back down to 93/57. Levophed started again at 64mcg/min until BP stable then stopped. At 1021 the BP dropped back down to 87/55. Levophed restarted at 19mcg/min and remains infusing with stable BP at 113/65 currently. Patient is alert and oriented x4. More alert now post anesthesia. Neuro checks all within normal limits.

## 2023-08-13 NOTE — Anesthesia Postprocedure Evaluation (Signed)
Anesthesia Post Note  Patient: Tony [name]  Procedure(s) Performed: Transcatheter Aortic Valve Replacement, Transfemoral     Patient location during evaluation: PACU Anesthesia Type: MAC Level of consciousness: awake and alert Pain management: pain level controlled Vital Signs Assessment: post-procedure vital signs reviewed and stable Respiratory status: spontaneous breathing Cardiovascular status: stable Anesthetic complications: no   There were no known notable events for this encounter.  Last Vitals:  Vitals:   08/13/23 1500 08/13/23 1630  BP: (!) 103/55 131/68  Pulse: 60 63  Resp: 15 15  Temp:  36.4 C  SpO2: 94% 100%    Last Pain:  Vitals:   08/13/23 1630  TempSrc: Oral  PainSc: 0-No pain                 Lewie Loron

## 2023-08-13 NOTE — Progress Notes (Signed)
6Fr Right Femoral arterial sheath pulled and manual pressure held for 20 mins. Jannet Mantis, RN pulled the 7Fr left venous sheath and held pressure for 15 mins. Bilateral groin sites are level 0. Patient tolerated the removal well. BP during the removal did go down to the 70's systolic with MAP between 57-60. Patient is alert and oriented x4. Denies feeling lightheaded or dizzy.

## 2023-08-13 NOTE — Discharge Summary (Incomplete)
[facility]   MULTIDISCIPLINARY HEART VALVE TEAM  Discharge Summary    Patient ID: RONYN DZIADOSZ MRN: [id number]; DOB: Feb 17, 1941  Admit date: 08/13/2023 Discharge date: 08/14/2023  Primary Care Provider: Ignatius Specking, MD  Primary Cardiologist: [name], MD / Dr. Lynnette Caffey & Dr. Leafy Ro (TAVR)  Discharge Diagnoses    Principal Problem:   S/P TAVR (transcatheter aortic valve replacement) Active Problems:   CAD (coronary artery disease) of artery bypass graft   Bilateral carotid artery disease (HCC)   Type 2 diabetes mellitus with complication, without long-term current use of insulin (HCC)   Dyslipidemia   Coronary artery disease involving native coronary artery of native heart without angina pectoris   Essential hypertension   AAA (abdominal aortic aneurysm) (HCC)   Aortic valve stenosis   S/P AAA (abdominal aortic aneurysm) repair   CKD (chronic kidney disease), stage III (HCC)   1st degree AV block   Allergies Allergies  Allergen Reactions   Cortisone Nausea And Vomiting   Statins Other (See Comments)    Pt states severe muscle pains.    Diagnostic Studies/Procedures    [facility]  TAVR OPERATIVE NOTE     Date of Procedure:                08/13/2023   Preoperative Diagnosis:      Severe Aortic Stenosis    Postoperative Diagnosis:    Same    Procedure:        Transcatheter Aortic Valve Replacement - Transfemoral Approach             Edwards Sapien 3 Resilia THV (size 26 mm, model # 9755RLS, serial # [id number])              Co-Surgeons:                         Eugenio Hoes, MD and Alverda Skeans, MD Anesthesiologist:                  Lewie Loron, MD   Echocardiographer:              Charlton Haws, MD   Pre-operative Echo Findings: Severe aortic stenosis Normal left ventricular systolic function   Post-operative Echo Findings: No paravalvular leak Normal left ventricular systolic function   Left Heart  Catheterization Findings: Left ventricular end-diastolic pressure of  _____________    Echo 08/14/23: IMPRESSIONS   1. Left ventricular ejection fraction, by estimation, is 60 to 65%. Left  ventricular ejection fraction by PLAX is 62 %. The left ventricle has  normal function. The left ventricle has no regional wall motion  abnormalities. There is mild concentric left  ventricular hypertrophy. Left ventricular diastolic parameters are  consistent with Grade I diastolic dysfunction (impaired relaxation).   2. Right ventricular systolic function is low normal. The right  ventricular size is normal. Tricuspid regurgitation signal is inadequate  for assessing PA pressure.   3. Left atrial size was moderately dilated.   4. The mitral valve is normal in structure. Trivial mitral valve  regurgitation.   5. The aortic valve has been repaired/replaced. Perivalvular leakage is  trivial. There is a 26 mm Edwards Sapien prosthetic (TAVR) valve present  in the aortic position. Procedure Date: 08/13/23. Echo findings are  consistent with normal structure and  function of the aortic valve prosthesis. Aortic valve area, by VTI  measures 2.32 cm. Aortic valve mean gradient measures 9.0  mmHg. Aortic  valve Vmax measures 1.92 m/s.   6. The inferior vena cava is normal in size with greater than 50%  respiratory variability, suggesting right atrial pressure of 3 mmHg.    History of Present Illness     ABUBAKER ERRINGTON is a 82 y.o. male with a history of HTN, HLD, CKD stage II, obesity (BMI 30), T2DM, 1st degree AV block, CAD s/p CABG (LIMA-->LAD, sSVG-->OM1 & dLCx, SVG-->RCA), PAD s/p AAA repair and aortoiliac grafting, carotid artery disease, and severe AS who presented to [facility] on [facility]/13/23 for planned TAVR.   He was seen by Dr. Antoine Poche recently and reported dyspnea and fatigue as well as exertional chest pain. [facility] 02/04/23 showed severe native vessel CAD with 2/4 patent grafts: patent LIMA to  LAD, occluded vein graft RCA and occluded distal limb of sequential vein graft. Echocardiogram 05/27/23 showed EF 70% and progression to severe aortic stenosis with mean grad 42 mmHg, AVA 0.98 cm2.   He was evaluated by the multidisciplinary valve team and felt to have severe, symptomatic aortic stenosis and to be a suitable candidate for TAVR, which was set up for 08/13/23.   Hospital Course     Consultants: none   Severe AS: s/p successful TAVR with a 26 mm Edwards Sapien 3 Ultra Resilia THV via the TF approach on 08/13/23. Post operative echo completed showed EF 60%, normally functioning TAVR with a mean gradient of 9 mmHg and trivial PVL. Groin sites are stable. Resumed on home Asprin 81mg  daily. Walked with cardiac rehab with no issues. Plan for discharge home today with close follow up in the outpatient setting.   1st deg AV block: Initial post op ECG showed a new LBBB but this regressed by ECG today which just shows an old first degree block with slight progression. He has had some rare missed beats on tele and 7 beats NSVT. Out of an abundance of caution, I will discharge him with a Zio AT to rule out HAVB.  Acute on chronic HFpEF: LVEDP 19 mm hg at the time of TAVR. He was treated with one dose of IV Lasix 20mg . He appears euvolemic today and will not discharge home on diuretic therapy.  CAD s/p CABG: [facility] 02/04/23 showed severe native vessel CAD with 2/4 patent grafts: patent LIMA to LAD, occluded vein graft RCA and occluded distal limb of sequential vein graft. Continue medical management with aspirin 81mg  daily, Crestor 10mg  3x/week and Repatha.   HTN: he was hypotensive post operative and all home meds were held. Now BP creeping up. Resume home Imdur 60mg  daily, benazepril 20mg  BID and Norvasc 2.5mg  daily.   Carotid stenosis: 40-59% stenosis in the right and chronic occluded left on dopplers in 04/2023. Continue medical management.    PAD/AAA s/p repair: continue medical therapy.    HLD: he cannot tolerate high dose statins: Continue Crestor 10mg  3 x week and Repatha 140mg  q 2weeks    DMT2: treated with SSI while admitted. Resume home meds at discharge.   Thrombocytopenia: platelets dropped to 99. Likely reactive after surgery.  _____________  Discharge Vitals Blood pressure (!) 142/69, pulse 85, temperature 98 F (36.7 C), temperature source Oral, resp. rate 14, height 5\' 11"  (1.803 m), weight 98.2 kg, SpO2 99%.  Filed Weights   08/13/23 0555 08/13/23 1237 08/14/23 0451  Weight: 95.3 kg 103 kg 98.2 kg     GEN: Well nourished, well developed, in no acute distress HEENT: normal Neck: no JVD or masses Cardiac: RRR;  2/6 flow murmur @ RUSB. No rubs, or gallops,no edema  Respiratory:  clear to auscultation bilaterally, normal work of breathing GI: soft, nontender, nondistended, + BS MS: no deformity or atrophy Skin: warm and dry, no rash.  Groin sites clear without hematoma. Some mild ecchymosis on left groin Neuro:  Alert and Oriented x 3, Strength and sensation are intact Psych: euthymic mood, full affect  Disposition   Pt is being discharged home today in good condition.  Follow-up Plans & Appointments     Follow-up Information     Orbie Pyo, MD. Go on 08/19/2023.   Specialty: Cardiology Why: @ 10:40am, please arrive at least 10 minutes early Contact information: [address] [place] [address] [phone]                  Discharge Medications   Allergies as of 08/14/2023       Reactions   Cortisone Nausea And Vomiting   Statins Other (See Comments)   Pt states severe muscle pains.        Medication List     TAKE these medications    amLODipine 2.5 MG tablet Commonly known as: NORVASC Take 1 tablet (2.5 mg total) by mouth daily.   aspirin EC 81 MG tablet Take 1 tablet (81 mg total) by mouth daily. Swallow whole.   benazepril 20 MG tablet Commonly known as: LOTENSIN Take 20 mg by mouth 2 (two)  times daily.   glipiZIDE 5 MG tablet Commonly known as: GLUCOTROL Take 5 mg by mouth 2 (two) times daily.   ibuprofen 200 MG tablet Commonly known as: ADVIL Take 400 mg by mouth every 8 (eight) hours as needed (pain.).   isosorbide mononitrate 60 MG 24 hr tablet Commonly known as: IMDUR Take 1 tablet (60 mg total) by mouth daily.   nitroGLYCERIN 0.4 MG SL tablet Commonly known as: NITROSTAT Place 1 tablet (0.4 mg total) under the tongue every 5 (five) minutes as needed for chest pain.   Repatha SureClick 140 MG/ML Soaj Generic drug: Evolocumab Inject 140 mg into the skin every 14 (fourteen) days.   rosuvastatin 10 MG tablet Commonly known as: CRESTOR Take 1 tablet by mouth up to three times weekly as tolerated          Outstanding Labs/Studies   none  Duration of Discharge Encounter   Greater than 30 minutes including physician time.  Byrd Hesselbach, PA-C 08/14/2023, 9:46 AM [phone]   ATTENDING ATTESTATION:  After conducting a review of all available clinical information with the care team, interviewing the patient, and performing a physical exam, I agree with the findings and plan described in this note.   GEN: No acute distress.   HEENT:  MMM, no JVD, no scleral icterus Cardiac: RRR, no murmurs, rubs, or gallops.  Respiratory: Clear to auscultation bilaterally. GI: Soft, nontender, non-distended  MS: No edema; No deformity. Neuro:  Nonfocal  Vasc:  +2 radial pulses; access sites intact  Patient doing well after TAVR with stable access sites, no evidence of stroke, and no conduction abnormalities.  Follow up echocardiogram with planned discharge and close hospital follow up.  Alverda Skeans, MD Pager [phone]

## 2023-08-13 NOTE — Progress Notes (Addendum)
Patient BP remains stable on Levophed at 47mcg/min. Carlean Jews, PA in room and I made her aware of his BP dropping down into the 80's systolic when Levophed is stopped. She ordered to stop Levophed and give a Bolus of Normal Saline. Carlean Jews stopped the Levophed. Normal Saline bolus was started. BP went down to 90's systolic with MAP of 65.

## 2023-08-13 NOTE — Progress Notes (Signed)
Mobility Specialist Progress Note:   08/13/23 1657  Mobility  Activity Ambulated with assistance in hallway  Level of Assistance Standby assist, set-up cues, supervision of patient - no hands on  Assistive Device Front wheel walker  Distance Ambulated (ft) 470 ft  Activity Response Tolerated well  Mobility Referral Yes  Mobility visit 1 Mobility  Mobility Specialist Start Time (ACUTE ONLY) 1642  Mobility Specialist Stop Time (ACUTE ONLY) 1655  Mobility Specialist Time Calculation (min) (ACUTE ONLY) 13 min   Pt received in bed, agreeable to mobility. Incision sites stable. Pt c/o slight leg weakness during ambulation, otherwise asx throughout. VSS. Pt returned to bed with call bell in reach and all needs met.   Leory Plowman  Mobility Specialist Please contact via [employer] office at [phone]

## 2023-08-13 NOTE — Op Note (Signed)
[facility]  TAVR OPERATIVE NOTE   Date of Procedure:  08/13/2023  Preoperative Diagnosis: Severe Aortic Stenosis   Postoperative Diagnosis: Same   Procedure:   Transcatheter Aortic Valve Replacement - Transfemoral Approach  Edwards Sapien 3 Resilia THV (size 26 mm, model # 9755RLS, serial # [id number])   Co-Surgeons:   Eugenio Hoes, MD and Alverda Skeans, MD Anesthesiologist:  Lewie Loron, MD  Echocardiographer:  Charlton Haws, MD  Pre-operative Echo Findings: Severe aortic stenosis Normal left ventricular systolic function  Post-operative Echo Findings: No paravalvular leak Normal left ventricular systolic function  Left Heart Catheterization Findings: Left ventricular end-diastolic pressure of   BRIEF CLINICAL NOTE AND INDICATIONS FOR SURGERY  The patient is an 82 year old male with a history of coronary artery disease status post CABG, peripheral arterial disease status post AAA repair and aortic iliac grafting, carotid disease, hypertension, hyperlipidemia, CKD stage II, type 2 diabetes, and severe symptomatic aortic stenosis who is referred for a elective transcatheter aortic valve replacement with a 26 mm SAPIEN 3 valve.  During the course of the patient's preoperative work up they have been evaluated comprehensively by a multidisciplinary team of specialists coordinated through the Multidisciplinary Heart Valve Clinic in the [facility].  They have been demonstrated to suffer from symptomatic severe aortic stenosis as noted above. The patient has been counseled extensively as to the relative risks and benefits of all options for the treatment of severe aortic stenosis including long term medical therapy, conventional surgery for aortic valve replacement, and transcatheter aortic valve replacement.  The patient has been independently evaluated by Dr. Leafy Ro with CT surgery and they are felt to be at high risk for conventional  surgical aortic valve replacement. The surgeon indicated the patient would be a poor candidate for conventional surgery. Based upon review of all of the patient's preoperative diagnostic tests they are felt to be candidate for transcatheter aortic valve replacement using the transfemoral approach as an alternative to high risk conventional surgery.    Following the decision to proceed with transcatheter aortic valve replacement, a discussion has been held regarding what types of management strategies would be attempted intraoperatively in the event of life-threatening complications, including whether or not the patient would be considered a candidate for the use of cardiopulmonary bypass and/or conversion to open sternotomy for attempted surgical intervention.  The patient has been advised of a variety of complications that might develop peculiar to this approach including but not limited to risks of death, stroke, paravalvular leak, aortic dissection or other major vascular complications, aortic annulus rupture, device embolization, cardiac rupture or perforation, acute myocardial infarction, arrhythmia, heart block or bradycardia requiring permanent pacemaker placement, congestive heart failure, respiratory failure, renal failure, pneumonia, infection, other late complications related to structural valve deterioration or migration, or other complications that might ultimately cause a temporary or permanent loss of functional independence or other long term morbidity.  The patient provides full informed consent for the procedure as described and all questions were answered preoperatively.    DETAILS OF THE OPERATIVE PROCEDURE  PREPARATION:   The patient is brought to the operating room on the above mentioned date and central monitoring was established by the anesthesia team including placement of a radial arterial line. The patient is placed in the supine position on the operating table.  Intravenous  antibiotics are administered. Conscious sedation is used.   Baseline transthoracic echocardiogram was performed. The patient's chest, abdomen, both groins, and both lower extremities  are prepared and draped in a sterile manner. A time out procedure is performed.   PERIPHERAL ACCESS:   Using the modified Seldinger technique, femoral arterial and venous access were obtained with placement of a 6 Fr sheath in the right femoral artery and a 6 Fr sheath in the left femoral vein using u/s guidance.  A pigtail diagnostic catheter was passed through the femoral arterial sheath under fluoroscopic guidance into the aortic root.  Aortic root angiography was performed in order to determine the optimal angiographic angle for valve deployment.  TRANSFEMORAL ACCESS:  A micropuncture kit was used to gain access to the left femoral artery using u/s guidance. Position confirmed with angiography. Pre-closure with double ProGlide closure devices. The patient was heparinized systemically and ACT verified > 250 seconds.    A 14 Fr transfemoral E-sheath was introduced into the left femoral artery after progressively dilating over an Amplatz superstiff wire. An AL-1 catheter was used to direct a straight-tip exchange length wire across the native aortic valve into the left ventricle. This was exchanged out for a pigtail catheter and position was confirmed in the LV apex. Simultaneous left ventricular, aortic, and left ventricular end-diastolic pressures were recorded.  The pigtail catheter was then exchanged for an Safari wire in the LV apex.  Direct LV pacing thresholds were assessed and found to be adequate.   TRANSCATHETER HEART VALVE DEPLOYMENT:  An Edwards Sapien 3 THV (size 26 mm) was prepared and crimped per manufacturer's guidelines, and the proper orientation of the valve is confirmed on the [employer] delivery system. The valve was advanced through the introducer sheath using normal technique until in an  appropriate position in the abdominal aorta beyond the sheath tip. The balloon was then retracted and using the fine-tuning wheel was centered on the valve. The valve was then advanced across the aortic arch using appropriate flexion of the catheter. The valve was carefully positioned across the aortic valve annulus. The Commander catheter was retracted using normal technique. Once final position of the valve has been confirmed by angiographic assessment, the valve is deployed while temporarily holding ventilation and during rapid ventricular pacing to maintain systolic blood pressure < 50 mmHg and pulse pressure < 10 mmHg. The balloon inflation is held for >3 seconds after reaching full deployment volume. Once the balloon has fully deflated the balloon is retracted into the ascending aorta and valve function is assessed using TTE. There is felt to be no paravalvular leak and no central aortic insufficiency.  The patient's hemodynamic recovery following valve deployment is good.  The deployment balloon and guidewire are both removed. Echo demostrated acceptable post-procedural gradients, stable mitral valve function, and no AI.   PROCEDURE COMPLETION:  The sheath was then removed and closure devices were completed. Protamine was administered once femoral arterial repair was complete. The pigtail catheters and femoral sheaths were removed with manual pressure applied to the arterial and venous sheaths.  The patient tolerated the procedure well and is transported to the surgical intensive care in stable condition. There were no immediate intraoperative complications. All sponge instrument and needle counts are verified correct at completion of the operation.   No blood products were administered during the operation.  The patient received a total of 120 mL of intravenous contrast during the procedure.  Orbie Pyo MD 08/13/2023 9:43 AM

## 2023-08-13 NOTE — Interval H&P Note (Signed)
History and Physical Interval Note:  08/13/2023 6:24 AM  Tony [name]  has presented today for surgery, with the diagnosis of Severe Aortic Stenosis.  The various methods of treatment have been discussed with the patient and family. After consideration of risks, benefits and other options for treatment, the patient has consented to  Procedure(s): Transcatheter Aortic Valve Replacement, Transfemoral (N/A) INTRAOPERATIVE TRANSTHORACIC ECHOCARDIOGRAM (N/A) as a surgical intervention.  The patient's history has been reviewed, patient examined, no change in status, stable for surgery.  I have reviewed the patient's chart and labs.  Questions were answered to the patient's satisfaction.     Eugenio Hoes

## 2023-08-13 NOTE — Discharge Instructions (Signed)

## 2023-08-13 NOTE — Transfer of Care (Addendum)
Immediate Anesthesia Transfer of Care Note  Patient: Jeshawn A Widrig  Procedure(s) Performed: Transcatheter Aortic Valve Replacement, Transfemoral  Patient Location: PACU, cath lab  Anesthesia Type:MAC  Level of Consciousness: awake and drowsy  Airway & Oxygen Therapy: Patient Spontanous Breathing and Patient connected to face mask oxygen  Post-op Assessment: Report given to RN and Post -op Vital signs reviewed and stable  Post vital signs: Reviewed and stable  Last Vitals:  Vitals Value Taken Time  BP 158/74 08/13/23 0939  Temp    Pulse 57 08/13/23 0939  Resp 21 08/13/23 0939  SpO2 96 % 08/13/23 0939  Vitals shown include unfiled device data.  Last Pain:  Vitals:   08/13/23 0622  TempSrc:   PainSc: 0-No pain         Complications: There were no known notable events for this encounter.

## 2023-08-14 ENCOUNTER — Inpatient Hospital Stay (HOSPITAL_COMMUNITY)
Admit: 2023-08-14 | Discharge: 2023-08-14 | Disposition: A | Discharge status: Home / Self Care | Payer: Medicare HMO | Attending: Physician Assistant

## 2023-08-14 ENCOUNTER — Inpatient Hospital Stay (HOSPITAL_COMMUNITY): Payer: Medicare HMO

## 2023-08-14 DIAGNOSIS — D696 Thrombocytopenia, unspecified: Secondary | ICD-10-CM | POA: Diagnosis not present

## 2023-08-14 DIAGNOSIS — I5033 Acute on chronic diastolic (congestive) heart failure: Secondary | ICD-10-CM | POA: Diagnosis not present

## 2023-08-14 DIAGNOSIS — I440 Atrioventricular block, first degree: Secondary | ICD-10-CM | POA: Diagnosis not present

## 2023-08-14 DIAGNOSIS — Z952 Presence of prosthetic heart valve: Secondary | ICD-10-CM

## 2023-08-14 DIAGNOSIS — Z006 Encounter for examination for normal comparison and control in clinical research program: Secondary | ICD-10-CM | POA: Diagnosis not present

## 2023-08-14 DIAGNOSIS — I350 Nonrheumatic aortic (valve) stenosis: Secondary | ICD-10-CM | POA: Diagnosis not present

## 2023-08-14 LAB — GLUCOSE, CAPILLARY: Glucose-Capillary: 157 mg/dL — ABNORMAL HIGH (ref 70–99)

## 2023-08-14 LAB — ECHOCARDIOGRAM COMPLETE
AR max vel: 2.32 cm2
AV Area VTI: 2.32 cm2
AV Area mean vel: 2.26 cm2
AV Mean grad: 9 mm[Hg]
AV Peak grad: 14.7 mm[Hg]
Ao pk vel: 1.92 m/s
Area-P 1/2: 2.74 cm2
Height: 71 in
S' Lateral: 2.5 cm
Weight: 3463.87 [oz_av]

## 2023-08-14 LAB — CBC
HCT: 38.7 % — ABNORMAL LOW (ref 39.0–52.0)
Hemoglobin: 12.8 g/dL — ABNORMAL LOW (ref 13.0–17.0)
MCH: 29.8 pg (ref 26.0–34.0)
MCHC: 33.1 g/dL (ref 30.0–36.0)
MCV: 90 fL (ref 80.0–100.0)
Platelets: 99 10*3/uL — ABNORMAL LOW (ref 150–400)
RBC: 4.3 MIL/uL (ref 4.22–5.81)
RDW: 13.3 % (ref 11.5–15.5)
WBC: 8.8 10*3/uL (ref 4.0–10.5)
nRBC: 0 % (ref 0.0–0.2)

## 2023-08-14 LAB — MAGNESIUM: Magnesium: 1.8 mg/dL (ref 1.7–2.4)

## 2023-08-14 LAB — BASIC METABOLIC PANEL
Anion gap: 9 (ref 5–15)
BUN: 18 mg/dL (ref 8–23)
CO2: 23 mmol/L (ref 22–32)
Calcium: 8.7 mg/dL — ABNORMAL LOW (ref 8.9–10.3)
Chloride: 106 mmol/L (ref 98–111)
Creatinine, Ser: 1.17 mg/dL (ref 0.61–1.24)
GFR, Estimated: >60 mL/min (ref >60)
Glucose, Bld: 136 mg/dL — ABNORMAL HIGH (ref 70–99)
Potassium: 4 mmol/L (ref 3.5–5.1)
Sodium: 138 mmol/L (ref 135–145)

## 2023-08-14 MED ORDER — POTASSIUM CHLORIDE CRYS ER 10 MEQ PO TBCR
10.0000 meq | EXTENDED_RELEASE_TABLET | Freq: Once | ORAL | Status: AC
  Administered 2023-08-14: 10 meq via ORAL
  Filled 2023-08-14: qty 1

## 2023-08-14 MED ORDER — FUROSEMIDE 10 MG/ML IJ SOLN
20.0000 mg | Freq: Once | INTRAMUSCULAR | Status: AC
  Administered 2023-08-14: 20 mg via INTRAVENOUS
  Filled 2023-08-14: qty 2

## 2023-08-14 NOTE — Progress Notes (Signed)
Echocardiogram 2D Echocardiogram has been performed.  Warren Lacy Ruhee Enck RDCS 08/14/2023, 8:00 AM

## 2023-08-14 NOTE — Progress Notes (Signed)
CARDIAC REHAB PHASE I   PRE:  Rate/Rhythm: 85 SR  BP:  Sitting: 142/68      SaO2: 98 RA  MODE:  Ambulation: 240 ft   POST:  Rate/Rhythm: 80 SR   BP:  Sitting: 148/80      SaO2: 96 RA    Pt ambulated in hallway, contact guard only. Tolerated well with no dizziness, pain or SOB. Returned to bed with call bell and bedside table in reach. Post TAVR education including site care, restrictions, risk factors, heart healthy diabetic diet, exercise guidelines and CRP2 reviewed. All questions and concerns addressed. Pt is not interested in CRP2 at this time. Plan for home later today.   [id number] Woodroe Chen, RN BSN 08/14/2023 10:25 AM

## 2023-08-14 NOTE — Progress Notes (Unsigned)
Patient ID: Tony [name] MRN: [id number] DOB/AGE: [birth year] 82 y.o.  Primary Care Physician:Vyas, Angelina Pih, MD Primary Cardiologist: [name], MD  FOCUSED CARDIOVASCULAR PROBLEM LIST  Aortic stenosis 26 S3 TAVR December 2024 Sinus rhythm with first-degree AV block Coronary artery disease CABG 2014 LIMA to LAD, sequential SVG to OM1 and distal LCx, and vein graft to RCA Coronary angiography 2024 patent LIMA to LAD, occluded vein graft RCA and occluded distal limb of sequential vein graft Type 2 diabetes mellitus Not on insulin Hyperlipidemia On Repatha Hypertension CKD stage II BMI 30 Peripheral vascular disease Status post AAA repair and aortobiiliac grafting Moderate bilateral carotid stenosis   HISTORY OF PRESENT ILLNESS:  11/24:  The patient is a 82 y.o. male with the indicated medical history here for recommendations regarding his severe aortic stenosis.  He was seen by his primary cardiologist recently with complaints of dyspnea and fatigue.  Echocardiogram demonstrated progression to severe aortic stenosis.  The patient is here with his wife.  He has noticed increasing lifestyle limiting shortness of breath for the last 6 months.  He has a large amount of property and needs to attend to it.  He has been unable to do this to a great degree.  He first noticed that he became short of breath when he would walk to his mailbox.  He had no problems walking to the mailbox but walking back he had to negotiate an incline and he found he was short of breath and had to stop to catch his breath.  He underwent coronary angiography as detailed above.  He underwent an echocardiogram which demonstrated progression to severe arctic stenosis.  He was then instructed to take it easy until his valve was addressed.  Over the last few weeks he is not done much physically.  He is quite motivated to get back to his normal functional capacity because he enjoys taking care of his house and  property and is always on the go.  He denies any regular or reproducible angina but at times he does develop chest tightness.  He has had no presyncope or syncope.  He does develop bilateral lower extremity burning when he walks at times.  He thinks this is related to sciatica and wears a brace to help with this.  He has both upper and lower dentures. Plan: Refer for cardiothoracic surgical opinion and TAVR protocol CTA.  12/24: The patient returns for 1 week follow-up after his uncomplicated TAVR procedure.  The patient had a 26 mm SAPIEN 3 valve placed via the left transfemoral approach.  He had a transient left bundle branch block after the procedure which regressed after 24 hours.  He was fitted with a ZIO monitor at discharge.  He did receive a dose of Lasix due to an elevated LVEDP.   Past Medical History:  Diagnosis Date   Arthritis    Bilateral hydrocele    CKD (chronic kidney disease), stage III (HCC)    Claudication of left lower extremity (HCC)    per pt when walks, go up stairs   Coronary artery disease cardiologist--- dr hochrein   s/p  severe 3V CAD,  CABG x3 02-09-2013; s/p AAA and bi-iliac bypass graft 02-09-2020   History of gout    09-07-2020  per pt has been several yrs, effects right great toe and trigger by asa   Hyperlipidemia    Hypertension    Occlusion and stenosis of bilateral carotid arteries    per last duplex in  [place] 05-05-2020 right ICA 40-59%, total occlusion left ICA   PVD (peripheral vascular disease) (HCC)    vascular --- dr Edilia Bo---  s/p  AAA repair and aorta bi-iliac bypass graft   S/P AAA (abdominal aortic aneurysm) repair 02/09/2020   S/P CABG x 3 02/09/2013   LIMA - LAD,  SVG -- RCA,  seqSVG -- OM1 and distal CFx   S/P TAVR (transcatheter aortic valve replacement) 08/13/2023   s/p TAVR with a 26 mm Edwards S3UR via the TF approach by Dr. Lynnette Caffey and Dr. Leafy Ro   Severe aortic stenosis    Type 2 diabetes mellitus (HCC)    followed by pcp----  (09-07-2020 per pt checks blood sugar in am dialy,  fasting sugar--- 118--130)   Wears glasses     Past Surgical History:  Procedure Laterality Date   ABDOMINAL AORTIC ANEURYSM REPAIR N/A 02/09/2020   Procedure: ABDOMINAL AORTIC ANEURYSM REPAIR;  Surgeon: Chuck Hint, MD;  Location: [facility] OR;  Service: Vascular;  Laterality: N/A;   ANTERIOR CERVICAL DECOMP/DISCECTOMY FUSION  12-13-2008  @ [facility]   C5--6   AORTA - BILATERAL FEMORAL ARTERY BYPASS GRAFT Bilateral 02/09/2020   Procedure: AORTA BI-ILIAC  BYPASS GRAFT;  Surgeon: Chuck Hint, MD;  Location: [facility] OR;  Service: Vascular;  Laterality: Bilateral;   AORTIC ENDARTERECETOMY N/A 02/09/2020   Procedure: AORTIC ENDARTERECETOMY;  Surgeon: Chuck Hint, MD;  Location: [facility] OR;  Service: Vascular;  Laterality: N/A;   CARDIAC CATHETERIZATION  02-05-2013  dr hochrein   severe 3V CAD, preserved EF   CARDIAC CATHETERIZATION  per pt 1993   had one blockage with no intervention per cardiology note   CORONARY ARTERY BYPASS GRAFT N/A 02/09/2013   Procedure: CORONARY ARTERY BYPASS GRAFTING (CABG);  Surgeon: Kerin Perna, MD;  Location: [facility] OR;  Service: Open Heart Surgery;  Laterality: N/A;  LIMA to the LAD, SVG to right coronary artery, sequential SVG to OM and distal circumflex.   HYDROCELE EXCISION Bilateral 09/09/2020   Procedure: BILATERAL HYDROCELECTOMY ADULT;  Surgeon: Bjorn Pippin, MD;  Location: [facility];  Service: Urology;  Laterality: Bilateral;   INTRAOPERATIVE TRANSESOPHAGEAL ECHOCARDIOGRAM N/A 02/09/2013   Procedure: INTRAOPERATIVE TRANSESOPHAGEAL ECHOCARDIOGRAM;  Surgeon: Kerin Perna, MD;  Location: [facility] OR;  Service: Open Heart Surgery;  Laterality: N/A;   LEFT HEART CATH AND CORS/GRAFTS ANGIOGRAPHY N/A 02/04/2023   Procedure: LEFT HEART CATH AND CORS/GRAFTS ANGIOGRAPHY;  Surgeon: Marykay Lex, MD;  Location: [facility] INVASIVE CV LAB;  Service: Cardiovascular;  Laterality: N/A;   LUMBAR FUSION   08-04-2007  @[facility]    L3 -- L5   REVERSE SHOULDER ARTHROPLASTY Right 06/25/2018   Procedure: RIGHT REVERSE SHOULDER ARTHROPLASTY;  Surgeon: Bjorn Pippin, MD;  Location: [facility] OR;  Service: Orthopedics;  Laterality: Right;   SCROTAL EXPLORATION Bilateral 10/07/2020   Procedure: SCROTUM EXPLORATION, DRAINAGE OF HYDOCELE;  Surgeon: Bjorn Pippin, MD;  Location: [facility];  Service: Urology;  Laterality: Bilateral;    Family History  Problem Relation Age of Onset   Heart failure Mother 61   Cancer Sister     Social History   Socioeconomic History   Marital status: Married    Spouse name: Not on file   Number of children: Not on file   Years of education: Not on file   Highest education level: Not on file  Occupational History   Not on file  Tobacco Use   Smoking status: Former    Current packs/day: 0.00  Average packs/day: 3.0 packs/day for 40.0 years (120.0 ttl pk-yrs)    Types: Cigarettes    Start date: 05/27/1952    Quit date: 05/27/1992    Years since quitting: 31.2   Smokeless tobacco: Never  Vaping Use   Vaping status: Never Used  Substance and Sexual Activity   Alcohol use: Not Currently    Comment: QUIT 1993   Drug use: Never   Sexual activity: Not on file  Other Topics Concern   Not on file  Social History Narrative   Lives at home with wife.          Social Drivers of Corporate investment banker Strain: Not on file  Food Insecurity: No Food Insecurity (08/13/2023)   Hunger Vital Sign    Worried About Running Out of Food in the Last Year: Never true    Ran Out of Food in the Last Year: Never true  Transportation Needs: No Transportation Needs (08/13/2023)   PRAPARE - Administrator, Civil Service (Medical): No    Lack of Transportation (Non-Medical): No  Physical Activity: Not on file  Stress: Not on file  Social Connections: Not on file  Intimate Partner Violence: Not At Risk (08/13/2023)   Humiliation, Afraid, Rape, and Kick  questionnaire    Fear of Current or Ex-Partner: No    Emotionally Abused: No    Physically Abused: No    Sexually Abused: No     Prior to Admission medications   Medication Sig Start Date End Date Taking? Authorizing Provider  amLODipine (NORVASC) 2.5 MG tablet Take 1 tablet (2.5 mg total) by mouth daily. 02/04/23 02/04/24  Marykay Lex, MD  aspirin EC 81 MG tablet Take 1 tablet (81 mg total) by mouth daily. Swallow whole. 01/30/23   [name], MD  benazepril (LOTENSIN) 20 MG tablet Take 20 mg by mouth 2 (two) times daily. 05/30/20   [provider]  Evolocumab (REPATHA SURECLICK) 140 MG/ML SOAJ Inject 140 mg into the skin every 14 (fourteen) days. 05/21/23   [name], MD  glipiZIDE (GLUCOTROL) 5 MG tablet Take 5 mg by mouth 2 (two) times daily.    [provider]  ibuprofen (ADVIL) 200 MG tablet Take 400 mg by mouth every 8 (eight) hours as needed (pain.).    [provider]  isosorbide mononitrate (IMDUR) 60 MG 24 hr tablet Take 1 tablet (60 mg total) by mouth daily. 07/04/23   [name], MD  nitroGLYCERIN (NITROSTAT) 0.4 MG SL tablet Place 1 tablet (0.4 mg total) under the tongue every 5 (five) minutes as needed for chest pain. 05/08/23   [name], MD  rosuvastatin (CRESTOR) 10 MG tablet Take 1 tablet by mouth up to three times weekly as tolerated 05/16/23   [name], MD    Allergies  Allergen Reactions   Cortisone Nausea And Vomiting   Statins Other (See Comments)    Pt states severe muscle pains.    REVIEW OF SYSTEMS:  General: no fevers/chills/night sweats Eyes: no blurry vision, diplopia, or amaurosis ENT: no sore throat or hearing loss Resp: no cough, wheezing, or hemoptysis CV: no edema or palpitations GI: no abdominal pain, nausea, vomiting, diarrhea, or constipation GU: no dysuria, frequency, or hematuria Skin: no rash Neuro: no headache, numbness, tingling, or weakness of extremities Musculoskeletal: no joint pain  or swelling Heme: no bleeding, DVT, or easy bruising Endo: no polydipsia or polyuria  There were no vitals taken for this visit.  PHYSICAL EXAM:  GEN:  AO x 3 in no acute distress HEENT: normal Dentition: Dentures Neck: JVP normal. +2 carotid upstrokes without bruits. No thyromegaly. Lungs: equal expansion, clear bilaterally CV: Apex is discrete and nondisplaced, RRR with 3 out of 6 systolic ejection murmur Abd: soft, non-tender, non-distended; no bruit; positive bowel sounds Ext: no edema, ecchymoses, or cyanosis Vascular: 2+ femoral pulses, 2+ radial pulses       Skin: warm and dry without rash Neuro: CN II-XII grossly intact; motor and sensory grossly intact    DATA AND STUDIES:  EKG:  EKG Interpretation Date/Time:    Ventricular Rate:    PR Interval:    QRS Duration:    QT Interval:    QTC Calculation:   R Axis:      Text Interpretation:          Cardiac Studies & Procedures   CARDIAC CATHETERIZATION  CARDIAC CATHETERIZATION 02/04/2023  Narrative   ------NATIVE CORONARIES----------   Annabell Sabal to Ost LAD lesion is 70% stenosed with 60% stenosed side branch in Ost Cx.   Ost LAD to Prox LAD lesion is 99% stenosed.  Prox LAD to Mid LAD lesion is 100% stenosed.AFTER 1st Diag & 1st Sep   Remainder of LCx is patent/normal; 1st Mrg lesion is 100% stenosed.  Grafted 2nd Mrg lesion is 100% stenosed.   Ost RCA to Prox RCA lesion is 100% stenosed.   ------GRAFTS----------   SVG-dRCA  graft was visualized by angiography and is normal in caliber.  Origin lesion is 100% stenosed.   Seq SVG- OM-dLCx graft was visualized by angiography and is normal in caliber.  Prox Graft to Dist Graft lesion between 2nd Mrg and 1st LPL is 100% stenosed.   LIM-LAD graft was visualized by angiography and is normal in caliber.  The graft exhibits no disease.   ---------HEMODYNAMICS-----------   There isat least MODERATE AORTIC VALVE STENOSIS. -Heavily calcified with severe jet.  Mean gradient is  between 23.6-33 5 mmHg.   LV end diastolic pressure is mildly elevated.   SUMMARY Severe Native Vessel CAD with 2 of 4 patent Grafts: Ost-distal LM 70% with 99% ostial LAD & 65% Ost LCx LAD 100% occluded after D1 & SP1 LCx is a Large (? codominant) artery with a small OM1 & likely occluded Om2 (grafted) & terminates as a ~ distal LPL /PDA that has the appearance of a stump from the seqential limb of SVG-OM-dLCx  Heavily Calcified Ostial RCA -very difficult to engage (due to aortic jet), appears to be 100% flush occluded Patent LIMA to LAD with retrograde filling D2 Patent proximal limb of Seq SVG-OM-dLCx, Occluded sequential limb 100% flush occluded SVG-RCA At Least Moderate Aortic Stenosis by direct measurement, however based on how difficult the valve was across and the severity of the aortic whip, could be more significant. Recommend reassessment of 2D Echo. = Mean gradient was either 23.6 mm or 33.4 mm depending on measurement. Minimally elevated LVEDP of 14 to 15 mmHg.   RECOMMENDATIONS Multiple potential sources for angina, however the only location amenable to PCI is the ostial LAD into the LCx which would require protecting the residual portion of the LAD.  This would require staged atherectomy and PCI.  It is also possible that he has had progression of his aortic valve disease. At this point recommendation will be aggressive medical management for what sounds like stable angina pectoris, if unable to control with medical therapy, could consider PCI of the ostial LM-LCx (which would need to be done  via femoral access) I have added Imdur 30 mg daily and amlodipine 2.5 mg daily. Recommend 2D Echocardiogram with close assessment of aortic valve gradients.  Consider the possibility of paradoxical low-flow stenosis.    Bryan Lemma, MD  Findings Coronary Findings Diagnostic  Dominance: Right  Left Main Ost LM to Ost LAD lesion is 70% stenosed with 60% stenosed side branch in  Ost Cx. The lesion is located at the bifurcation and eccentric. The lesion is calcified.  Left Anterior Descending Ost LAD to Prox LAD lesion is 99% stenosed. The lesion is severely calcified. Prox LAD to Mid LAD lesion is 100% stenosed. The lesion is chronically occluded.  First Diagonal Branch Vessel is small in size.  Second Diagonal Branch Vessel is small in size.  Left Circumflex Vessel is large.  First Obtuse Marginal Branch Vessel is small in size. 1st Mrg lesion is 100% stenosed.  Second Obtuse Marginal Branch Vessel is large in size. 2nd Mrg lesion is 100% stenosed.  Left Posterior Atrioventricular Artery Vessel is large in size.  Right Coronary Artery Vessel was not visualized due to known occlusion. Ost RCA to Prox RCA lesion is 100% stenosed. The lesion is severely calcified.  Saphenous Graft To RPDA SVG graft was visualized by angiography and is normal in caliber. Origin lesion is 100% stenosed.  Sequential Jump Graft Graft To 2nd Mrg, 1st LPL Seq SVG- OM-dLCx graft was visualized by angiography and is normal in caliber. Prox Graft to Dist Graft lesion between 2nd Mrg and 1st LPL  is 100% stenosed.  LIMA LIMA Graft To Mid LAD LIMA graft was visualized by angiography and is normal in caliber.  The graft exhibits no disease.  Intervention  No interventions have been documented.    ECHOCARDIOGRAM  ECHOCARDIOGRAM COMPLETE 08/14/2023  Narrative ECHOCARDIOGRAM REPORT    Patient Name:   Tony [name] Date of Exam: 08/14/2023 Medical Rec #:  [id number]      Height:       71.0 in Accession #:    [id number]     Weight:       216.5 lb Date of Birth:  09-Jan-1941      BSA:          2.181 m Patient Age:    82 years       BP:           140/66 mmHg Patient Gender: M              HR:           81 bpm. Exam Location:  Inpatient  Procedure: 2D Echo, Color Doppler and Cardiac Doppler  Indications:    Post TAVR Evaluation z95.2  History:        Patient has  prior history of Echocardiogram examinations, most recent 08/13/2023. CAD, Prior CABG, CKD; Risk Factors:Hypertension, Diabetes and Dyslipidemia. Aortic Valve: 26 mm Edwards Sapien prosthetic, stented (TAVR) valve is present in the aortic position. Procedure Date: 08/13/23.  Sonographer:    Irving Burton Senior RDCS Referring Phys: Janetta Hora  IMPRESSIONS   1. Left ventricular ejection fraction, by estimation, is 60 to 65%. Left ventricular ejection fraction by PLAX is 62 %. The left ventricle has normal function. The left ventricle has no regional wall motion abnormalities. There is mild concentric left ventricular hypertrophy. Left ventricular diastolic parameters are consistent with Grade I diastolic dysfunction (impaired relaxation). 2. Right ventricular systolic function is low normal. The right ventricular size is normal. Tricuspid regurgitation signal is inadequate for  assessing PA pressure. 3. Left atrial size was moderately dilated. 4. The mitral valve is normal in structure. Trivial mitral valve regurgitation. 5. The aortic valve has been repaired/replaced. Perivalvular leakage is trivial. There is a 26 mm Edwards Sapien prosthetic (TAVR) valve present in the aortic position. Procedure Date: 08/13/23. Echo findings are consistent with normal structure and function of the aortic valve prosthesis. Aortic valve area, by VTI measures 2.32 cm. Aortic valve mean gradient measures 9.0 mmHg. Aortic valve Vmax measures 1.92 m/s. 6. The inferior vena cava is normal in size with greater than 50% respiratory variability, suggesting right atrial pressure of 3 mmHg.  FINDINGS Left Ventricle: Left ventricular ejection fraction, by estimation, is 60 to 65%. Left ventricular ejection fraction by PLAX is 62 %. The left ventricle has normal function. The left ventricle has no regional wall motion abnormalities. The left ventricular internal cavity size was normal in size. There is mild concentric  left ventricular hypertrophy. Left ventricular diastolic parameters are consistent with Grade I diastolic dysfunction (impaired relaxation).  Right Ventricle: The right ventricular size is normal. No increase in right ventricular wall thickness. Right ventricular systolic function is low normal. Tricuspid regurgitation signal is inadequate for assessing PA pressure.  Left Atrium: Left atrial size was moderately dilated.  Right Atrium: Right atrial size was normal in size.  Pericardium: There is no evidence of pericardial effusion. Presence of epicardial fat layer.  Mitral Valve: The mitral valve is normal in structure. Trivial mitral valve regurgitation.  Tricuspid Valve: The tricuspid valve is normal in structure. Tricuspid valve regurgitation is trivial.  Aortic Valve: The aortic valve has been repaired/replaced. Aortic valve regurgitation is trivial. Aortic valve mean gradient measures 9.0 mmHg. Aortic valve peak gradient measures 14.7 mmHg. Aortic valve area, by VTI measures 2.32 cm. There is a 26 mm Edwards Sapien prosthetic, stented (TAVR) valve present in the aortic position. Procedure Date: 08/13/23. Echo findings are consistent with normal structure and function of the aortic valve prosthesis.  Pulmonic Valve: The pulmonic valve was normal in structure. Pulmonic valve regurgitation is trivial.  Aorta: The aortic root and ascending aorta are structurally normal, with no evidence of dilitation.  Venous: The inferior vena cava is normal in size with greater than 50% respiratory variability, suggesting right atrial pressure of 3 mmHg.  IAS/Shunts: No atrial level shunt detected by color flow Doppler.   LEFT VENTRICLE PLAX 2D LV EF:         Left            Diastology ventricular     LV e' medial:    7.29 cm/s ejection        LV E/e' medial:  10.1 fraction by     LV e' lateral:   5.66 cm/s PLAX is 62      LV E/e' lateral: 13.0 %. LVIDd:         3.70 cm LVIDs:         2.50  cm LV PW:         1.30 cm LV IVS:        1.40 cm LVOT diam:     2.30 cm LV SV:         91 LV SV Index:   42 LVOT Area:     4.15 cm   RIGHT VENTRICLE RV S prime:     11.00 cm/s TAPSE (M-mode): 1.4 cm  LEFT ATRIUM              Index  RIGHT ATRIUM           Index LA diam:        4.60 cm  2.11 cm/m   RA Area:     15.40 cm LA Vol (A2C):   76.6 ml  35.13 ml/m  RA Volume:   33.40 ml  15.32 ml/m LA Vol (A4C):   124.0 ml 56.86 ml/m LA Biplane Vol: 105.0 ml 48.15 ml/m AORTIC VALVE AV Area (Vmax):    2.32 cm AV Area (Vmean):   2.26 cm AV Area (VTI):     2.32 cm AV Vmax:           192.00 cm/s AV Vmean:          142.000 cm/s AV VTI:            0.392 m AV Peak Grad:      14.7 mmHg AV Mean Grad:      9.0 mmHg LVOT Vmax:         107.00 cm/s LVOT Vmean:        77.100 cm/s LVOT VTI:          0.219 m LVOT/AV VTI ratio: 0.56  AORTA Ao Asc diam: 3.30 cm  MITRAL VALVE MV Area (PHT): 2.74 cm     SHUNTS MV Decel Time: 277 msec     Systemic VTI:  0.22 m MV E velocity: 73.40 cm/s   Systemic Diam: 2.30 cm MV A velocity: 104.00 cm/s MV E/A ratio:  0.71  Dalton McleanMD Electronically signed by Wilfred Lacy Signature Date/Time: 08/14/2023/8:29:30 AM    Final    CT SCANS  CT CORONARY MORPH W/CTA COR W/SCORE 07/15/2023  Addendum 08/01/2023 12:23 PM ADDENDUM REPORT: 08/01/2023 12:20  EXAM: OVER-READ INTERPRETATION  CT CHEST  The following report is an over-read performed by radiologist Dr. Curly Shores [facility] on 08/01/2023. This over-read does not include interpretation of cardiac or coronary anatomy or pathology. The coronary CTA interpretation by the cardiologist is attached.  COMPARISON:  None.  FINDINGS: Cardiovascular: Atheromatous calcifications. See findings discussed in the body of the report.  Mediastinum/Nodes: No suspicious adenopathy identified. Imaged mediastinal structures are unremarkable.  Lungs/Pleura: Imaged lungs  are clear. No pleural effusion or pneumothorax.  Upper Abdomen: No acute abnormality.  Musculoskeletal: No chest wall abnormality. No acute osseous findings.  IMPRESSION: No acute extracardiac incidental findings.   Electronically Signed By: Layla Maw M.D. On: 08/01/2023 12:20  Narrative CLINICAL DATA:  Aortic Stenosis  EXAM: Cardiac TAVR CT  TECHNIQUE: The patient was scanned on a Siemens Force 192 slice scanner. A 120 kV retrospective scan was triggered in the ascending thoracic aorta at 140 HU's. Gantry rotation speed was 250 msecs and collimation was .6 mm. No beta blockade or nitro were given. The 3D data set was reconstructed in 5% intervals of the R-R cycle. Systolic and diastolic phases were analyzed on a dedicated work station using MPR, MIP and VRT modes. The patient received 80 cc of contrast.  FINDINGS: Aortic Valve: Heavily calcified tri leaflet AV with score 3281  Aorta: Severe calcific atherosclerosis no aneurysm normal arch vessels  Sino-tubular Junction: Calcified 28.5 mm  Ascending Thoracic Aorta: 34 mm  Aortic Arch: 28 mm  Descending Thoracic Aorta: Tortuous 30 mm  Sinus of Valsalva Measurements:  Non-coronary: 36.9 mm  Height 28 mm  Right - coronary: 37.9 mm  Height 26.3 mm  Left -   coronary: 36.8 mm  Height 23.6 mm  Coronary Artery Height above Annulus:  Left Main: 17.8 mm above annulus  Right Coronary: RCA 22.4 mm above annulus  Virtual Basal Annulus Measurements:  Maximum / Minimum Diameter: 27.5 mm x 20.6 mm Average diameter 24.3 mm  Perimeter: 78.4 mm  Area: 464 mm2  Coronary Arteries: Sufficient height above annulus for deployment  Optimum Fluoroscopic Angle for Delivery: LAO 12 Caudal 5 degrees  Membranous septal length 6 mm  IMPRESSION: 1.  Calcified tri leaflet AV with score 3281  2. Annular area of 464 mm2 suitable for a 26 mm Sapien valve. Given dense nodular calcium at base of non coronary cusp would  not likely recommend self expanding valve  3. Coronary arteries sufficient height above annulus for deployment Occluded native RCA and occluded SVG to RCA. Patient LIMA to LAD and patent SVG to OM  4.  Membranous septal length 6 mm  5. Optimum angiographic angle for deployment LAO 12 Caudal 5 degrees  6. Severe calcific atherosclerosis of aorta with tortuous descending thoracic aorta  Charlton Haws  Electronically Signed: By: Charlton Haws M.D. On: 07/15/2023 12:06          08/09/2023: ALT 20 08/14/2023: BUN 18; Creatinine, Ser 1.17; Hemoglobin 12.8; Magnesium 1.8; Platelets 99; Potassium 4.0; Sodium 138       ASSESSMENT AND PLAN:   S/P TAVR (transcatheter aortic valve replacement)  1st degree AV block  Coronary artery disease involving coronary bypass graft of native heart without angina pectoris  Type 2 diabetes mellitus with complication, without long-term current use of insulin (HCC)  Hypertension associated with diabetes (HCC)  Hyperlipidemia associated with type 2 diabetes mellitus (HCC)  PAD (peripheral artery disease) (HCC)  *** Patient will require SBE prophylaxis for any dental work or dental procedures.   Orbie Pyo, MD  08/14/2023 5:28 PM    [facility] [address] [place], [place], [place]  [address] Phone: [phone]; Fax: [phone]

## 2023-08-14 NOTE — Plan of Care (Signed)

## 2023-08-14 NOTE — Progress Notes (Signed)
Discharge instructions (including medications) discussed with and copy provided to patient/caregiver 

## 2023-08-14 NOTE — Progress Notes (Signed)
Notified of non-sustained V-tach of 8 beats.  Asymptomatic sitting on side of the bed.

## 2023-08-14 NOTE — Progress Notes (Signed)
   08/14/23 1150  TOC Brief Assessment  Insurance and Status Reviewed  Patient has primary care physician Yes  Home environment has been reviewed home  Prior level of function: independent  Prior/Current Home Services No current home services  Social Drivers of Health Review SDOH reviewed no interventions necessary  Readmission risk has been reviewed Yes  Transition of care needs no transition of care needs at this time    Pt s/p TAVR- stable for transition home- no HH or DME needs noted. Pt has transportation home

## 2023-08-15 ENCOUNTER — Telehealth: Payer: Self-pay | Admitting: Physician Assistant

## 2023-08-15 DIAGNOSIS — Z952 Presence of prosthetic heart valve: Secondary | ICD-10-CM | POA: Diagnosis not present

## 2023-08-15 NOTE — Progress Notes (Signed)
ZIO AT applied at hospital. Dr. Antoine Poche to read.

## 2023-08-15 NOTE — Telephone Encounter (Signed)
  HEART AND VASCULAR CENTER   MULTIDISCIPLINARY HEART VALVE TEAM   Patient contacted regarding discharge from [facility] on 12/18  Patient understands to follow up with a structural heart APP on 12/23 at [facility].  Patient understands discharge instructions? yes Patient understands medications and regimen? yes Patient understands to bring all medications to this visit? yes  Cline Crock PA-C  [facility]

## 2023-08-19 ENCOUNTER — Encounter: Payer: Self-pay | Admitting: Internal Medicine

## 2023-08-19 ENCOUNTER — Ambulatory Visit: Payer: Medicare HMO | Attending: Internal Medicine | Admitting: Internal Medicine

## 2023-08-19 VITALS — BP 140/80 | HR 75 | Ht 71.0 in | Wt 215.8 lb

## 2023-08-19 DIAGNOSIS — I25810 Atherosclerosis of coronary artery bypass graft(s) without angina pectoris: Secondary | ICD-10-CM

## 2023-08-19 DIAGNOSIS — E118 Type 2 diabetes mellitus with unspecified complications: Secondary | ICD-10-CM | POA: Diagnosis not present

## 2023-08-19 DIAGNOSIS — E1159 Type 2 diabetes mellitus with other circulatory complications: Secondary | ICD-10-CM

## 2023-08-19 DIAGNOSIS — E785 Hyperlipidemia, unspecified: Secondary | ICD-10-CM

## 2023-08-19 DIAGNOSIS — E1169 Type 2 diabetes mellitus with other specified complication: Secondary | ICD-10-CM

## 2023-08-19 DIAGNOSIS — I447 Left bundle-branch block, unspecified: Secondary | ICD-10-CM | POA: Diagnosis not present

## 2023-08-19 DIAGNOSIS — I739 Peripheral vascular disease, unspecified: Secondary | ICD-10-CM | POA: Diagnosis not present

## 2023-08-19 DIAGNOSIS — I152 Hypertension secondary to endocrine disorders: Secondary | ICD-10-CM

## 2023-08-19 DIAGNOSIS — Z952 Presence of prosthetic heart valve: Secondary | ICD-10-CM

## 2023-08-19 DIAGNOSIS — I440 Atrioventricular block, first degree: Secondary | ICD-10-CM

## 2023-08-19 MED ORDER — AMLODIPINE BESYLATE 5 MG PO TABS: 5.0000 mg | ORAL_TABLET | Freq: Every day | ORAL | 3 refills | Status: DC

## 2023-08-19 NOTE — Patient Instructions (Addendum)
Medication Instructions:  Your physician has recommended you make the following change in your medication:  1.) increase amlodipine to 5 mg - one tablet daily  *If you need a refill on your cardiac medications before your next appointment, please call your pharmacy*   Lab Work: none If you have labs (blood work) drawn today and your tests are completely normal, you will receive your results only by: MyChart Message (if you have MyChart) OR A paper copy in the mail If you have any lab test that is abnormal or we need to change your treatment, we will call you to review the results.   Testing/Procedures: As planned   Follow-Up: As planned  Other Instructions

## 2023-08-19 NOTE — Addendum Note (Signed)
Addended by: Lendon Ka on: 08/19/2023 11:24 AM   Modules accepted: Orders

## 2023-08-22 ENCOUNTER — Telehealth (HOSPITAL_COMMUNITY): Payer: Self-pay | Admitting: *Deleted

## 2023-08-22 NOTE — Telephone Encounter (Signed)
Received referral for this pt to participate in cardiac rehab by Dr. Lynnette Caffey s/p TAVR.  Pt seen by inpatient cardiac rehab phase I staff and indicated that he was not interested in participating.  Noted that pt lives in [place].  Called and spoke to pt for extended period of time regarding his change  of mind and preference of location.  Pt is reluctant to participate as this is a 50 mile drive one way to [place].  Offered [employer] as an option.  Pt stated that he would like to come to [facility] because of the great care he received and it would be a 35 mile drive to [place].  Pt states that he lives on 2 acres of land with 600 feet yard front.  He has been walking daily for his exercise with no issues or complaints.  Still wearing zio patch and reports no "dizzy" spells or any instances he pushed the alert button.  Noted that his BP is improved with the addition of amlodipine. Pt has also joined silver sneakers and it is right down the road.  Pt declines to participate in CR at this time due to distance to travel and prefers to exercise on his own.  Reminded him about temperature precautions.  Stated that he lived in [place] for a while and grew accustom to cold weather and using a scarf over his mouth. Reminded pt to start with silver sneakers one day a week and add a day each weak as tolerated.  Pt has signed up but has not started yet. Pt thanked me for the call. Alanson Aly, BSN Cardiac and Emergency planning/management officer

## 2023-09-09 ENCOUNTER — Telehealth: Payer: Self-pay | Admitting: Internal Medicine

## 2023-09-09 NOTE — Telephone Encounter (Signed)
 [employer] calling with abnormal results. Call transferred.

## 2023-09-09 NOTE — Telephone Encounter (Signed)
 Tony [name] from [employer] with end of summary report. Patient wore monitor for 13 days.   A pause due to high grade AV Block lasted 3.9 secs on 12/27 at 536 am   Full report has not been uploaded yet.

## 2023-09-13 ENCOUNTER — Ambulatory Visit (INDEPENDENT_AMBULATORY_CARE_PROVIDER_SITE_OTHER): Payer: Medicare HMO | Admitting: Physician Assistant

## 2023-09-13 ENCOUNTER — Ambulatory Visit (HOSPITAL_COMMUNITY): Payer: Medicare HMO | Attending: Internal Medicine

## 2023-09-13 VITALS — BP 138/70 | HR 64 | Ht 71.0 in | Wt 216.0 lb

## 2023-09-13 DIAGNOSIS — E785 Hyperlipidemia, unspecified: Secondary | ICD-10-CM | POA: Diagnosis not present

## 2023-09-13 DIAGNOSIS — I25810 Atherosclerosis of coronary artery bypass graft(s) without angina pectoris: Secondary | ICD-10-CM | POA: Diagnosis not present

## 2023-09-13 DIAGNOSIS — I6523 Occlusion and stenosis of bilateral carotid arteries: Secondary | ICD-10-CM

## 2023-09-13 DIAGNOSIS — I440 Atrioventricular block, first degree: Secondary | ICD-10-CM | POA: Insufficient documentation

## 2023-09-13 DIAGNOSIS — I10 Essential (primary) hypertension: Secondary | ICD-10-CM | POA: Diagnosis not present

## 2023-09-13 DIAGNOSIS — Z952 Presence of prosthetic heart valve: Secondary | ICD-10-CM

## 2023-09-13 DIAGNOSIS — I739 Peripheral vascular disease, unspecified: Secondary | ICD-10-CM

## 2023-09-13 DIAGNOSIS — I5032 Chronic diastolic (congestive) heart failure: Secondary | ICD-10-CM | POA: Diagnosis not present

## 2023-09-13 LAB — ECHOCARDIOGRAM COMPLETE
AV Mean grad: 8.6 mm[Hg]
AV Peak grad: 15.9 mm[Hg]
Ao pk vel: 2 m/s
Area-P 1/2: 3.08 cm2
MV M vel: 5.36 m/s
MV Peak grad: 114.9 mm[Hg]
P 1/2 time: 532 ms
S' Lateral: 3.2 cm

## 2023-09-13 NOTE — Progress Notes (Signed)
HEART AND VASCULAR CENTER   MULTIDISCIPLINARY HEART VALVE CLINIC                                     Cardiology Office Note:    Date:  09/13/2023   ID:  Tony [name], DOB [birth year], MRN [id number]  PCP:  Ignatius Specking, MD  [facility] Cardiologist:  [name], MD / Dr. Lynnette Caffey & Dr. Leafy Ro (TAVR)  [facility] Electrophysiologist:  None   Referring MD: Ignatius Specking, MD   1 month s/p TAVR  History of Present Illness:    Tony [name] is a 83 y.o. male with a hx of HTN, HLD, CKD stage II, obesity (BMI 30), T2DM, 1st degree AV block, CAD s/p CABG (LIMA-->LAD, sSVG-->OM1 & dLCx, SVG-->RCA), PAD s/p AAA repair and aortoiliac grafting, carotid artery disease, and severe AS s/p TAVR (08/13/23) who presents to clinic for follow up.  He was seen by Dr. Antoine Poche recently and reported dyspnea and fatigue as well as exertional chest pain. [facility] 02/04/23 showed severe native vessel CAD with 2/4 patent grafts: patent LIMA to LAD, occluded vein graft RCA and occluded distal limb of sequential vein graft. Echocardiogram 05/27/23 showed EF 70% and progression to severe aortic stenosis with mean grad 42 mmHg, AVA 0.98 cm2.  He underwent TAVR with a 26 mm Edwards Sapien 3 Ultra Resilia THV via the TF approach on 08/13/23. Post operative echo showed EF 60%, normally functioning TAVR with a mean gradient of 9 mmHg and trivial PVL.Marland Kitchen Resumed on home Asprin 81mg  daily.  He did develop a transient new LBBB in addition to his chronic 1st degree block and a Zio AT was placed which showed one 3.9 second nocturnal pause.   Today the patient presents to clinic for follow up. Here with his wife. He used to get burning in his chest and legs when taking trash out. Now he can do this without any issues. Feels like a new man. No CP or SOB. No LE edema, orthopnea or PND. No dizziness or syncope. No blood in stool or urine. No palpitations.    Past Medical History:  Diagnosis Date   Arthritis    Bilateral  hydrocele    CKD (chronic kidney disease), stage III (HCC)    Claudication of left lower extremity (HCC)    per pt when walks, go up stairs   Coronary artery disease cardiologist--- dr hochrein   s/p  severe 3V CAD,  CABG x3 02-09-2013; s/p AAA and bi-iliac bypass graft 02-09-2020   History of gout    09-07-2020  per pt has been several yrs, effects right great toe and trigger by asa   Hyperlipidemia    Hypertension    Occlusion and stenosis of bilateral carotid arteries    per last duplex in epic 05-05-2020 right ICA 40-59%, total occlusion left ICA   PVD (peripheral vascular disease) (HCC)    vascular --- dr Edilia Bo---  s/p  AAA repair and aorta bi-iliac bypass graft   S/P AAA (abdominal aortic aneurysm) repair 02/09/2020   S/P CABG x 3 02/09/2013   LIMA - LAD,  SVG -- RCA,  seqSVG -- OM1 and distal CFx   S/P TAVR (transcatheter aortic valve replacement) 08/13/2023   s/p TAVR with a 26 mm Edwards S3UR via the TF approach by Dr. Lynnette Caffey and Dr. Leafy Ro   Severe aortic stenosis    Type 2  diabetes mellitus (HCC)    followed by pcp---- (09-07-2020 per pt checks blood sugar in am dialy,  fasting sugar--- 118--130)   Wears glasses      Current Medications: Current Meds  Medication Sig   amLODipine (NORVASC) 5 MG tablet Take 1 tablet (5 mg total) by mouth daily.   aspirin EC 81 MG tablet Take 1 tablet (81 mg total) by mouth daily. Swallow whole.   benazepril (LOTENSIN) 20 MG tablet Take 20 mg by mouth 2 (two) times daily.   Evolocumab (REPATHA SURECLICK) 140 MG/ML SOAJ Inject 140 mg into the skin every 14 (fourteen) days.   glipiZIDE (GLUCOTROL) 5 MG tablet Take 5 mg by mouth 2 (two) times daily.   ibuprofen (ADVIL) 200 MG tablet Take 400 mg by mouth every 8 (eight) hours as needed (pain.).   isosorbide mononitrate (IMDUR) 60 MG 24 hr tablet Take 1 tablet (60 mg total) by mouth daily.   nitroGLYCERIN (NITROSTAT) 0.4 MG SL tablet Place 1 tablet (0.4 mg total) under the tongue every 5  (five) minutes as needed for chest pain.   rosuvastatin (CRESTOR) 10 MG tablet Take 1 tablet by mouth up to three times weekly as tolerated      ROS:   Please see the history of present illness.    All other systems reviewed and are negative.  EKGs       Risk Assessment/Calculations:           Physical Exam:    VS:  BP 138/70   Pulse 64   Ht 5\' 11"  (1.803 m)   Wt 216 lb (98 kg)   SpO2 96%   BMI 30.13 kg/m     Wt Readings from Last 3 Encounters:  09/13/23 216 lb (98 kg)  08/19/23 215 lb 12.8 oz (97.9 kg)  08/14/23 216 lb 7.9 oz (98.2 kg)     GEN: Well nourished, well developed in no acute distress NECK: No JVD CARDIAC: RRR, soft flow murmur @ RUSB. No rubs, gallops RESPIRATORY:  Clear to auscultation without rales, wheezing or rhonchi  ABDOMEN: Soft, non-tender, non-distended EXTREMITIES:  No edema; No deformity.   ASSESSMENT:    1. S/P TAVR (transcatheter aortic valve replacement)   2. 1st degree AV block   3. Chronic diastolic CHF (congestive heart failure) (HCC)   4. Coronary artery disease involving coronary bypass graft of native heart without angina pectoris   5. Essential hypertension   6. Bilateral carotid artery stenosis   7. PAD (peripheral artery disease) (HCC)   8. Hyperlipidemia, unspecified hyperlipidemia type     PLAN:    In order of problems listed above:  Severe AS s/p TAVR: echo today shows EF 55%, mod asymmetric LVH, mild RVE, normally functioning TAVR with a mean gradient of 8.6 mm hg and mild PVL. NYHA class I symptoms with a marked clinical improvement since TAVR. Continue Aspirin 81mg  daily. SBE prophylaxis discussed; the patient is edentulous and does not go to the dentist. I will see back for 1 year office visit with echo.   1st deg AV block: Zio AT was overall reasurring but did show one 3.9 second nocturnal pause.   Chronic HFpEF: appears euvolemic off diuretics.    CAD s/p CABG: [facility] 02/04/23 showed severe native vessel CAD  with 2/4 patent grafts: patent LIMA to LAD, occluded vein graft RCA and occluded distal limb of sequential vein graft. Continue medical management with aspirin 81mg  daily, Crestor 10mg  3x/week and Repatha.   HTN: BP with  reasonable control. Continue Imdur 60mg  daily, benazepril 20mg  BID and Norvasc 5mg  daily recently increased from 2.5mg ).   Carotid stenosis: 40-59% stenosis in the right and chronic occluded left on dopplers in 04/2023. Continue medical management.    PAD/AAA s/p repair: continue medical therapy.   HLD: he cannot tolerate high dose statins: Continue Crestor 10mg  3 x week and Repatha 140mg  q 2weeks     Medication Adjustments/Labs and Tests Ordered: Current medicines are reviewed at length with the patient today.  Concerns regarding medicines are outlined above.  Orders Placed This Encounter  Procedures   ECHOCARDIOGRAM COMPLETE   No orders of the defined types were placed in this encounter.   Patient Instructions  Medication Instructions:   Your physician recommends that you continue on your current medications as directed. Please refer to the Current Medication list given to you today.   *If you need a refill on your cardiac medications before your next appointment, please call your pharmacy*   Lab Work:  None ordered.  If you have labs (blood work) drawn today and your tests are completely normal, you will receive your results only by: MyChart Message (if you have MyChart) OR A paper copy in the mail If you have any lab test that is abnormal or we need to change your treatment, we will call you to review the results.   Testing/Procedures:  Your physician has requested that you have an echocardiogram. Echocardiography is a painless test that uses sound waves to create images of your heart. It provides your doctor with information about the size and shape of your heart and how well your heart's chambers and valves are working. This procedure takes approximately  one hour. There are no restrictions for this procedure. Please do NOT wear cologne, aftershave, or lotions (deodorant is allowed). Please arrive 15 minutes prior to your appointment time.  Please note: We ask at that you not bring children with you during ultrasound (echo/ vascular) testing. Due to room size and safety concerns, children are not allowed in the ultrasound rooms during exams. Our front office staff cannot provide observation of children in our lobby area while testing is being conducted. An adult accompanying a patient to their appointment will only be allowed in the ultrasound room at the discretion of the ultrasound technician under special circumstances. We apologize for any inconvenience.    Follow-Up: At [facility], you and your health needs are our priority.  As part of our continuing mission to provide you with exceptional heart care, we have created designated Provider Care Teams.  These Care Teams include your primary Cardiologist (physician) and Advanced Practice Providers (APPs -  Physician Assistants and Nurse Practitioners) who all work together to provide you with the care you need, when you need it.  We recommend signing up for the patient portal called "MyChart".  Sign up information is provided on this After Visit Summary.  MyChart is used to connect with patients for Virtual Visits (Telemedicine).  Patients are able to view lab/test results, encounter notes, upcoming appointments, etc.  Non-urgent messages can be sent to your provider as well.   To learn more about what you can do with MyChart, go to [email or web address].    Your next appointment:   2 month(s)  Provider:   [name], MD     Other Instructions    1st Floor: - Lobby - Registration  - Pharmacy  - Lab - Cafe  2nd Floor: - PV Lab - Diagnostic Testing (echo,  CT, nuclear med)  3rd Floor: - Vacant  4th Floor: - TCTS (cardiothoracic surgery) - AFib Clinic -  Structural Heart Clinic - Vascular Surgery  - Vascular Ultrasound  5th Floor: - HeartCare Cardiology (general and EP) - Clinical Pharmacy for coumadin, hypertension, lipid, weight-loss medications, and med management appointments    Valet parking services will be available as well.           Signed, Cline Crock, PA-C  09/13/2023 9:15 PM     [facility]

## 2023-09-13 NOTE — Patient Instructions (Signed)
Medication Instructions:   Your physician recommends that you continue on your current medications as directed. Please refer to the Current Medication list given to you today.   *If you need a refill on your cardiac medications before your next appointment, please call your pharmacy*   Lab Work:  None ordered.  If you have labs (blood work) drawn today and your tests are completely normal, you will receive your results only by: MyChart Message (if you have MyChart) OR A paper copy in the mail If you have any lab test that is abnormal or we need to change your treatment, we will call you to review the results.   Testing/Procedures:  Your physician has requested that you have an echocardiogram. Echocardiography is a painless test that uses sound waves to create images of your heart. It provides your doctor with information about the size and shape of your heart and how well your heart's chambers and valves are working. This procedure takes approximately one hour. There are no restrictions for this procedure. Please do NOT wear cologne, aftershave, or lotions (deodorant is allowed). Please arrive 15 minutes prior to your appointment time.  Please note: We ask at that you not bring children with you during ultrasound (echo/ vascular) testing. Due to room size and safety concerns, children are not allowed in the ultrasound rooms during exams. Our front office staff cannot provide observation of children in our lobby area while testing is being conducted. An adult accompanying a patient to their appointment will only be allowed in the ultrasound room at the discretion of the ultrasound technician under special circumstances. We apologize for any inconvenience.    Follow-Up: At [facility], you and your health needs are our priority.  As part of our continuing mission to provide you with exceptional heart care, we have created designated Provider Care Teams.  These Care Teams include  your primary Cardiologist (physician) and Advanced Practice Providers (APPs -  Physician Assistants and Nurse Practitioners) who all work together to provide you with the care you need, when you need it.  We recommend signing up for the patient portal called "MyChart".  Sign up information is provided on this After Visit Summary.  MyChart is used to connect with patients for Virtual Visits (Telemedicine).  Patients are able to view lab/test results, encounter notes, upcoming appointments, etc.  Non-urgent messages can be sent to your provider as well.   To learn more about what you can do with MyChart, go to [email or web address].    Your next appointment:   2 month(s)  Provider:   [name], MD     Other Instructions    1st Floor: - Lobby - Registration  - Pharmacy  - Lab - Cafe  2nd Floor: - PV Lab - Diagnostic Testing (echo, CT, nuclear med)  3rd Floor: - Vacant  4th Floor: - TCTS (cardiothoracic surgery) - AFib Clinic - Structural Heart Clinic - Vascular Surgery  - Vascular Ultrasound  5th Floor: - HeartCare Cardiology (general and EP) - Clinical Pharmacy for coumadin, hypertension, lipid, weight-loss medications, and med management appointments    Valet parking services will be available as well.

## 2023-09-30 ENCOUNTER — Telehealth: Payer: Self-pay | Admitting: Physician Assistant

## 2023-09-30 MED ORDER — AMLODIPINE BESYLATE 10 MG PO TABS: 10.0000 mg | ORAL_TABLET | Freq: Every day | ORAL | 3 refills | Status: DC

## 2023-09-30 NOTE — Telephone Encounter (Addendum)
  HEART AND VASCULAR CENTER   MULTIDISCIPLINARY HEART VALVE TEAM  Corrected Entry 09/30/23: Pt called in with elevated BPs and Norvasc 5mg  daily was increased to 10mg  daily and called into his pharmacy.  Cline Crock PA-C  [facility]

## 2023-10-10 DIAGNOSIS — M25571 Pain in right ankle and joints of right foot: Secondary | ICD-10-CM | POA: Diagnosis not present

## 2023-10-23 DIAGNOSIS — S93401D Sprain of unspecified ligament of right ankle, subsequent encounter: Secondary | ICD-10-CM | POA: Diagnosis not present

## 2023-10-23 DIAGNOSIS — R262 Difficulty in walking, not elsewhere classified: Secondary | ICD-10-CM | POA: Diagnosis not present

## 2023-10-28 DIAGNOSIS — M79671 Pain in right foot: Secondary | ICD-10-CM | POA: Diagnosis not present

## 2023-10-28 DIAGNOSIS — M25571 Pain in right ankle and joints of right foot: Secondary | ICD-10-CM | POA: Diagnosis not present

## 2023-10-29 ENCOUNTER — Telehealth: Payer: Self-pay | Admitting: Physician Assistant

## 2023-10-29 ENCOUNTER — Telehealth: Payer: Self-pay

## 2023-10-29 MED ORDER — FUROSEMIDE 20 MG PO TABS: 20.0000 mg | ORAL_TABLET | Freq: Every day | ORAL | 11 refills | Status: DC

## 2023-10-29 NOTE — Addendum Note (Signed)
 Addended by: Janetta Hora on: 10/29/2023 01:19 PM   Modules accepted: Orders

## 2023-10-29 NOTE — Telephone Encounter (Signed)
 Lasix 20mg  daily called in for pt. See previous phone note.

## 2023-10-29 NOTE — Telephone Encounter (Signed)
 The patient reported severe itching across his back for about a week. He thought amlodipine or isosorbide may be the cause, however, he has been on both meds long term. He did have a recent increase of amlodipine to 10 mg daily due to HTN (see 09/30/23 phone note). He reported no rash and no dry skin in the area. He tried lotion and it does not relieve the itching. The itching wakes him up at night. His BP has been 150s-160s/70s-80s.  He has scheduled follow-up with Dr. Delrae Sawyers 3/10. Informed the patient he will be called with recommendations.

## 2023-10-29 NOTE — Telephone Encounter (Signed)
 I spoke to patient and told him I do not think that his itching is related to cardiac medications. He is very concerned with his elevated BPs and also reports some worsening shortness of breath. Will call in Lasix 20mg  daily. He sees Dr. Antoine Poche 3/10 for regular follow up. Will have him continue keeping a log and get a BMET when he comes into see Dr. Antoine Poche.

## 2023-11-03 NOTE — Progress Notes (Unsigned)
 Cardiology Office Note:   Date:  11/03/2023  ID:  Tony [name], DOB 23-Feb-1941, MRN [id number] PCP: Ignatius Specking, MD  [facility] Providers Cardiologist:  [name], MD {  History of Present Illness:   Tony [name] is a 83 y.o. male  who presents for followup after CABG in June of 2014.  His most recent cath was June 2024.  He was found to have diffuse disease with a patent LIMA to the LAD.  He had an occluded SVG to the circ and occluded SVG to the RCA.  He had symptomatic AS and is now status post TAVR with a 26 mm Edwards Sapien 3 Ultra Resilia THV via the TF approach on 08/13/23 .  He called recently with HTN and increased SOB.   ***     He returns for follow-up.  He has been getting some chest discomfort walking up a slight incline in his yard.  He gets his burning discomfort and is now more short of breath.  He is having less exercise tolerance.    ROS: ***  Studies Reviewed:    EKG:       ***  Risk Assessment/Calculations:   {Does this patient have ATRIAL FIBRILLATION?:[phone]} No BP recorded.  {Refresh Note OR Click here to enter BP  :1}***        Physical Exam:   VS:  There were no vitals taken for this visit.   Wt Readings from Last 3 Encounters:  09/13/23 216 lb (98 kg)  08/19/23 215 lb 12.8 oz (97.9 kg)  08/14/23 216 lb 7.9 oz (98.2 kg)     GEN: Well nourished, well developed in no acute distress NECK: No JVD; No carotid bruits CARDIAC: ***RR, *** murmurs, rubs, gallops RESPIRATORY:  Clear to auscultation without rales, wheezing or rhonchi  ABDOMEN: Soft, non-tender, non-distended EXTREMITIES:  No edema; No deformity   ASSESSMENT AND PLAN:   CAD/CABG: *** He has disease as described and reported elsewhere.  He does have chest discomfort which could certainly be anginal which needs to be managed medically.   I will increase his isosorbide to 60 mg.  However, this certainly also could be related to his aortic stenosis which will be managed as  below.     HTN:    The blood pressure is *** at target.  Continue the meds as listed.     TAVR:  ***   This has progressed and TAVR would be indicated if he is a candidate.  He be interested in this.  I contacted the structural team and they have set him up for an appointment with Dr. Druscilla Brownie and they will pursue further imaging and workup for possible valve replacement.    CAROTID STENOSIS:   He had 40 to 59% stenosis in the right and chronic occluded left Doppler in Sept 2024. ***    He no longer is being seen by [facility] and we will make sure he has follow-up carotid Doppler in 2025.    PVD:   ABIs suggested moderate disease on the right lower extremity and moderate on the left.  ***  He was having some leg pain.  However, there was no obstructive disease found on ABIs earlier this year.  He will continue with risk reduction.     AAA:    He is status post repair.    *** He saw Dr. Edilia Bo earlier this year and routine follow-up was no longer suggested.   HYPERLIPIDEMIA:   ***  He is intolerant of statins.  We had managed to get him PCSK9 and he is going to have follow-up lipids by his primary provider in February.     DM:    A1C was *** 6.2.  No change in therapy.     Follow up ***  Signed, [name], MD

## 2023-11-04 ENCOUNTER — Ambulatory Visit: Payer: Medicare HMO | Attending: Cardiology | Admitting: Cardiology

## 2023-11-04 ENCOUNTER — Encounter: Payer: Self-pay | Admitting: Cardiology

## 2023-11-04 VITALS — BP 148/66 | HR 75 | Ht 71.0 in | Wt 217.8 lb

## 2023-11-04 DIAGNOSIS — I10 Essential (primary) hypertension: Secondary | ICD-10-CM

## 2023-11-04 DIAGNOSIS — I25810 Atherosclerosis of coronary artery bypass graft(s) without angina pectoris: Secondary | ICD-10-CM | POA: Diagnosis not present

## 2023-11-04 DIAGNOSIS — Z952 Presence of prosthetic heart valve: Secondary | ICD-10-CM

## 2023-11-04 DIAGNOSIS — E785 Hyperlipidemia, unspecified: Secondary | ICD-10-CM | POA: Diagnosis not present

## 2023-11-04 DIAGNOSIS — I714 Abdominal aortic aneurysm, without rupture, unspecified: Secondary | ICD-10-CM

## 2023-11-04 MED ORDER — BENAZEPRIL HCL 20 MG PO TABS
20.0000 mg | ORAL_TABLET | Freq: Every day | ORAL | 3 refills | Status: AC
Start: 2023-11-04 — End: ?

## 2023-11-04 NOTE — Patient Instructions (Addendum)
 Medication Instructions:  New script for lotensin 20 mg by mouth daily. *If you need a refill on your cardiac medications before your next appointment, please call your pharmacy*   FASTING Lipid profile and BMP in the next week or two.  Follow-Up: At [facility], you and your health needs are our priority.  As part of our continuing mission to provide you with exceptional heart care, we have created designated Provider Care Teams.  These Care Teams include your primary Cardiologist (physician) and Advanced Practice Providers (APPs -  Physician Assistants and Nurse Practitioners) who all work together to provide you with the care you need, when you need it.  We recommend signing up for the patient portal called "MyChart".  Sign up information is provided on this After Visit Summary.  MyChart is used to connect with patients for Virtual Visits (Telemedicine).  Patients are able to view lab/test results, encounter notes, upcoming appointments, etc.  Non-urgent messages can be sent to your provider as well.   To learn more about what you can do with MyChart, go to [email or web address].    Your next appointment:   6 month(s)  Provider:   First available APP        Other Instructions      \

## 2023-11-05 DIAGNOSIS — M65961 Unspecified synovitis and tenosynovitis, right lower leg: Secondary | ICD-10-CM | POA: Diagnosis not present

## 2023-11-05 DIAGNOSIS — M19071 Primary osteoarthritis, right ankle and foot: Secondary | ICD-10-CM | POA: Diagnosis not present

## 2023-11-05 DIAGNOSIS — M84463A Pathological fracture, right fibula, initial encounter for fracture: Secondary | ICD-10-CM | POA: Diagnosis not present

## 2023-11-05 DIAGNOSIS — M25571 Pain in right ankle and joints of right foot: Secondary | ICD-10-CM | POA: Diagnosis not present

## 2023-11-05 DIAGNOSIS — M65971 Unspecified synovitis and tenosynovitis, right ankle and foot: Secondary | ICD-10-CM | POA: Diagnosis not present

## 2023-11-05 DIAGNOSIS — M722 Plantar fascial fibromatosis: Secondary | ICD-10-CM | POA: Diagnosis not present

## 2023-11-06 DIAGNOSIS — E7800 Pure hypercholesterolemia, unspecified: Secondary | ICD-10-CM | POA: Diagnosis not present

## 2023-11-06 DIAGNOSIS — Z79899 Other long term (current) drug therapy: Secondary | ICD-10-CM | POA: Diagnosis not present

## 2023-11-07 ENCOUNTER — Other Ambulatory Visit

## 2023-11-07 ENCOUNTER — Telehealth: Payer: Self-pay | Admitting: Physician Assistant

## 2023-11-07 DIAGNOSIS — I447 Left bundle-branch block, unspecified: Secondary | ICD-10-CM

## 2023-11-07 DIAGNOSIS — R42 Dizziness and giddiness: Secondary | ICD-10-CM

## 2023-11-07 DIAGNOSIS — Z952 Presence of prosthetic heart valve: Secondary | ICD-10-CM

## 2023-11-07 LAB — LAB REPORT - SCANNED: EGFR: 61

## 2023-11-07 NOTE — Telephone Encounter (Signed)
  [facility]   MULTIDISCIPLINARY HEART VALVE TEAM  Pt called in with new dizziness. He was noted to have a new LBBB after TAVR on 08/19/23. His BP has been running good (even on the high side). Recently started back on an ARB. He has not passed out but felt like he might once. I offered him an apt tomorrow to be seen and get an ECG. He has another apt with his orthopedic doctor and declined. He was interested in repeating a cardiac monitor. I will have a Zio AT mailed to his house. We went over ER precautions.   Cline Crock PA-C  [facility]

## 2023-11-07 NOTE — Progress Notes (Unsigned)
 Enrolled patient for a 14 day Zio AT monitor to be mailed to patients home  [name] to read

## 2023-11-08 DIAGNOSIS — M76821 Posterior tibial tendinitis, right leg: Secondary | ICD-10-CM | POA: Diagnosis not present

## 2023-11-08 DIAGNOSIS — M25571 Pain in right ankle and joints of right foot: Secondary | ICD-10-CM | POA: Diagnosis not present

## 2023-11-11 NOTE — Telephone Encounter (Signed)
 Pt called into the office to give an update on dizziness. The pt's dizziness lasted for a day and a half and he states that he stopped a medication and dizziness resolved.  Unfortunately, the pt is very confused about his medications and he cannot tell me what medication he stopped. The pt also received his heart monitor but due to cost he is not planning on wearing and is mailing back into the company. Tony [name] is also concerned about his BP running high and today it was 160/72 and pt states his pulse this morning was 30-40. I asked the pt if his heart rate has been running 30-40 during the day and he states that it is normally around 70 and only low in the morning.  I advised the pt that we would like him to wear heart monitor if possible so that we can track his heart rate and see if dizziness correlates to abnormal rhythm. The pt plans to keep the monitor another week and then decide about wearing vs mailing back in.  I also advised the pt that he needs to take his medications as prescribed and keep and BP/pulse log.

## 2023-12-04 ENCOUNTER — Telehealth: Payer: Self-pay | Admitting: Cardiology

## 2023-12-04 NOTE — Telephone Encounter (Signed)
 Attempted to call patient, no answer left message requesting a call back.

## 2023-12-04 NOTE — Telephone Encounter (Signed)
 Pt stated his legs are burning when he exercising and it's going all the way up to his hip. Please advise.

## 2023-12-05 NOTE — Telephone Encounter (Signed)
 2nd attempt to call patient, no answer left message requesting a call back.

## 2023-12-06 NOTE — Telephone Encounter (Signed)
 Patient was returning call. Please advise ?

## 2023-12-06 NOTE — Telephone Encounter (Signed)
 Patient identification verified by 2 forms. Marilynn Rail, RN    Called and spoke to patient  Patient states:   -since aortic valve was placed burning in legs when walking has persisted and worsened   -he is having hard time walking on property   -when walking a lot he develops burning sensation in leg up to hip   -did not discuss symptoms at 3/10 OV with Dr. Antoine Poche   -would like to have follow up with Dr. Antoine Poche at [place] office to discuss  Patient denies:   -numbness/tingling in legs  Informed patient:   -No upcoming appointments with Dr. Antoine Poche   -Message sent to Dr. Antoine Poche for assistance  Patient verbalized understanding, no questions at this time

## 2023-12-19 ENCOUNTER — Telehealth: Payer: Self-pay

## 2023-12-19 DIAGNOSIS — I739 Peripheral vascular disease, unspecified: Secondary | ICD-10-CM

## 2023-12-19 DIAGNOSIS — M79604 Pain in right leg: Secondary | ICD-10-CM

## 2023-12-19 NOTE — Telephone Encounter (Signed)
-----   Message from Abagail Hoar sent at 12/19/2023  4:54 PM EDT ----- Regarding: refill Can someone please call this pt to talk about a drug he ran out of. [name] pt.   Thanks you KT

## 2023-12-19 NOTE — Telephone Encounter (Signed)
 Spoke with patient about message received in triage:  ----- Message from Abagail Hoar sent at 12/19/2023  4:54 PM EDT ----- Regarding: refill Can someone please call this pt to talk about a drug he ran out of. [name] pt.    Thanks you KT    Patient states he has 2 days left on his Rx of amlodipine  10 mg daily. He filled Rx on 09/30/23 with 90 day supply given. Too early to fill, he states this is what the pharmacy told him. Next fill would be due around 12/28/23.  Attempted to call pharmacy, unable to connect call ended.  Patient states he does have some 5 mg tablets left and can double up on those until he can refill current Rx.  Patient denies taking extra doses of amlodipine .  Patient also reports about 2 weeks after his TAVR procedure he began experiencing burning in his legs from his ankle to his hip when walking for extended periods of time. He states this is improving but would like to know what is causing this. He mentioned concern about circulation. Denies swelling or pain in legs.  Will forward message to Jeronimo Moors, PA-C to review.

## 2023-12-20 NOTE — Telephone Encounter (Signed)
 RE: Received: Thomes Flicker, PA-C  P [facility] Triage Caller: Unspecified (Yesterday,  5:40 PM) I would recommend he use the smaller dose of amlodipine  to get him through to the refill or pay for it to be filled early. We can try sending it in again, if that might help it get paid for. For exertional LE pain, I think we should get lower extremity arterial Doppler's and ABIs.  Thank you Gillian Lacrosse.   Returned a call back to the pt and endorsed the above recommendations per Jeronimo Moors PA-C.  Pt did confirm with me that he has plenty of the 5 mg tablets of amlodipine  to be able to double up on this until he is able to refill his 10 mg tablets on 5/3.   Pt aware that we will order for him to get LE Arterial US /ABI's done, for exertional LE pain.  Pt aware I will place the order and send a message to our PV Scheduler to call him back to arrange this appt.   Pt asked to have his dopplers done in [place] if possible, for commute reasons.   Pt verbalized understanding and agrees with this plan.

## 2023-12-25 NOTE — Telephone Encounter (Signed)
 Tony Grates, MD  You7 days ago    I am going to be adding several clinic days up in [place].  There are spots that do not open on Wednesday Thursday and Friday the week of May 12.  When it is open he can be added to 1 of those slots.   Patient identification verified by 2 forms. Hilton Lucky, RN    Called and spoke to patient  Patient scheduled for OV 5/16 at 11:20 AM  Patient agrees, no further questions at this time

## 2023-12-26 ENCOUNTER — Telehealth: Payer: Self-pay

## 2023-12-26 NOTE — Telephone Encounter (Signed)
-----   Message from [facility] sent at 12/26/2023  1:19 PM EDT ----- Regarding: FW: call pt  ----- Message ----- From: Antonetta Kitchen, RN Sent: 12/26/2023  12:47 PM EDT To: Thaddeus Filippo, RN; [facility] Triage Subject: FW: call pt                                     ----- Message ----- From: Ardia Kraft, PA-C Sent: 12/26/2023   9:51 AM EDT To: [facility] T[facility] Subject: call pt                                        Can someone please call this pt to discuss questions about upcoming apts. Also, can you give him the correct number to call for the main office? He is a [facility] [place] pt who happened to have TAVR.  Thank you,  KT

## 2023-12-26 NOTE — Telephone Encounter (Signed)
 Patient identification verified by 2 forms. Hilton Lucky, RN    Called and spoke to patient  Patient states:   -he received two phone calls from office today, unsure from who   -he does not currently have any questions or concerns about up coming appointments   -he has testing scheduled for 5/15 in [place] and follow with [name] on 5/16 Patient has no questions or concerns at this time

## 2023-12-28 ENCOUNTER — Other Ambulatory Visit: Payer: Self-pay | Admitting: Cardiology

## 2024-01-09 ENCOUNTER — Ambulatory Visit (INDEPENDENT_AMBULATORY_CARE_PROVIDER_SITE_OTHER)

## 2024-01-09 ENCOUNTER — Ambulatory Visit: Payer: Self-pay | Admitting: Cardiology

## 2024-01-09 ENCOUNTER — Ambulatory Visit: Attending: Physician Assistant

## 2024-01-09 DIAGNOSIS — I739 Peripheral vascular disease, unspecified: Secondary | ICD-10-CM | POA: Diagnosis not present

## 2024-01-09 DIAGNOSIS — I70203 Unspecified atherosclerosis of native arteries of extremities, bilateral legs: Secondary | ICD-10-CM | POA: Diagnosis not present

## 2024-01-09 DIAGNOSIS — M79604 Pain in right leg: Secondary | ICD-10-CM

## 2024-01-09 DIAGNOSIS — M79605 Pain in left leg: Secondary | ICD-10-CM

## 2024-01-09 NOTE — Progress Notes (Signed)
 Cardiology Office Note:   Date:  01/11/2024  ID:  Tony [name], DOB August 16, 1941, MRN [id number] PCP: Tony Bitters, MD  [facility] Providers Cardiologist:  Tony Grates, MD {  History of Present Illness:   Tony [name] is a 83 y.o. male who presents for followup after CABG in June of 2014.  His most recent cath was June 2024.  He was found to have diffuse disease with a patent LIMA to the LAD.  He had an occluded SVG to the circ and occluded SVG to the RCA.  He had symptomatic AS and is now status post TAVR with a 26 mm Edwards Sapien 3 Ultra Resilia THV via the TF approach on 08/13/23 .  He called recently with HTN and increased SOB.   He was started on Lasix  20 mg daily.    He called recently and was having some leg burning with exercise.  I sent him for vascular lower extremity duplex study yesterday.  On the right he has irregular calcified plaque suggestive of proximal occlusion.  There is a large left plaque also suggestive of occlusion.  He says he walks short distance and he gets the pain in his legs and going up to his hips.  He thinks it is more than he remembers but again he is not describing resting symptoms.  He is not describing any nonhealing ulcers although when I took off his shoe he does have blood on his sock.  I removed a Band-Aid from his right great toe which she had stubbed.  Blood seems to be coming from underneath the nailbed.  He walks around his house without his shoes on.  He saw Dr. Shaunna [name] last year.  In 02/09/2020 he had aortic endarterectomy and repair of an infrarenal abdominal aortic aneurysm with aortobifem bypass grafting.  He had some stable claudication when he saw [facility] last and it was decided to manage him medically.  From a cardiac standpoint he is actually doing well.  He denies any new shortness of breath, PND or orthopnea.  He has had no new palpitations, presyncope or syncope.  He feels much better since his TAVR.   ROS: As stated in the HPI  and negative for all other systems.  Studies Reviewed:    EKG:    NA  Risk Assessment/Calculations:     Physical Exam:   VS:  BP (!) 146/64   Pulse 60   Ht 5\' 11"  (1.803 m)   Wt 211 lb (95.7 kg)   SpO2 95%   BMI 29.43 kg/m    Wt Readings from Last 3 Encounters:  01/10/24 211 lb (95.7 kg)  11/04/23 217 lb 12.8 oz (98.8 kg)  09/13/23 216 lb (98 kg)     GEN: Well nourished, well developed in no acute distress NECK: No JVD; No carotid bruits CARDIAC: RRR, brief 2 out of 6 apical systolic murmur nonradiating, no diastolic murmurs, rubs, gallops RESPIRATORY:  Clear to auscultation without rales, wheezing or rhonchi  ABDOMEN: Soft, non-tender, non-distended EXTREMITIES:  No edema; No deformity, absent dorsalis pedis and posttibial's bilaterally, trauma to the right great toe nailbed  ASSESSMENT AND PLAN:   CAD/CABG:  The patient has no new sypmtoms.  No further cardiovascular testing is indicated.  We will continue with aggressive risk reduction and meds as listed.  HTN:    The blood pressure is at target.  No change in therapy.   TAVR: He had stable valve replacement.  No change in therapy.  CAROTID STENOSIS:   He had 40 to 59% stenosis in the right and chronic occluded left Doppler in Sept 2024.  He will have follow-up this year.    PVD:   Again he does not have resting pain.  I am worried about his toe we talked about not walking around in bare feet anymore.  I would like him to reestablish with a new provider at [facility] and I will make this referral.   AAA:    He is status post repair.       HYPERLIPIDEMIA:   LDL 160.  I am going to increase his Lipitor  to 20 mg daily and check a lipid profile in 3 months.    DM:    A1C was 6.4 in February 2025.  No change in therapy.      Follow up with me in 1 year or sooner if needed  Signed, Tony Grates, MD

## 2024-01-10 ENCOUNTER — Ambulatory Visit: Admitting: Cardiology

## 2024-01-10 ENCOUNTER — Encounter: Payer: Self-pay | Admitting: Cardiology

## 2024-01-10 VITALS — BP 146/64 | HR 60 | Ht 71.0 in | Wt 211.0 lb

## 2024-01-10 DIAGNOSIS — Z79899 Other long term (current) drug therapy: Secondary | ICD-10-CM | POA: Diagnosis not present

## 2024-01-10 DIAGNOSIS — I714 Abdominal aortic aneurysm, without rupture, unspecified: Secondary | ICD-10-CM

## 2024-01-10 DIAGNOSIS — Z952 Presence of prosthetic heart valve: Secondary | ICD-10-CM | POA: Diagnosis not present

## 2024-01-10 DIAGNOSIS — E785 Hyperlipidemia, unspecified: Secondary | ICD-10-CM

## 2024-01-10 DIAGNOSIS — I25810 Atherosclerosis of coronary artery bypass graft(s) without angina pectoris: Secondary | ICD-10-CM | POA: Diagnosis not present

## 2024-01-10 DIAGNOSIS — I10 Essential (primary) hypertension: Secondary | ICD-10-CM | POA: Diagnosis not present

## 2024-01-10 DIAGNOSIS — I739 Peripheral vascular disease, unspecified: Secondary | ICD-10-CM

## 2024-01-10 MED ORDER — ROSUVASTATIN CALCIUM 20 MG PO TABS: 20.0000 mg | ORAL_TABLET | Freq: Every day | ORAL | 3 refills | Status: DC

## 2024-01-10 NOTE — Patient Instructions (Signed)
 Medication Instructions:  Your physician has recommended you make the following change in your medication:  Increase rosuvastatin  to 20 mg daily Continue all other medications as prescribed  Labwork: Your physician recommends that you return for a FASTING lipid profile: 3 months. Please do not eat or drink for at least 8 hours when you have this done. You may take your medications that morning with a sip of water.  Testing/Procedures: none  Follow-Up: Your physician recommends that you schedule a follow-up appointment in: 1 year. You will receive a reminder call in about 8-10 months reminding you to schedule your appointment. If you don't receive this call, please contact our office.  Any Other Special Instructions Will Be Listed Below (If Applicable). You have been referred to [facility]. You will be contacted with this appointment.   If you need a refill on your cardiac medications before your next appointment, please call your pharmacy.

## 2024-01-11 ENCOUNTER — Encounter: Payer: Self-pay | Admitting: Cardiology

## 2024-01-11 LAB — VAS US ABI WITH/WO TBI
Left ABI: 0.57
Right ABI: 0.89

## 2024-01-14 ENCOUNTER — Ambulatory Visit: Attending: Vascular Surgery | Admitting: Physician Assistant

## 2024-01-14 VITALS — BP 145/72 | HR 58 | Temp 97.8°F | Resp 18 | Ht 71.0 in | Wt 213.1 lb

## 2024-01-14 DIAGNOSIS — Z9889 Other specified postprocedural states: Secondary | ICD-10-CM | POA: Diagnosis not present

## 2024-01-14 DIAGNOSIS — I70213 Atherosclerosis of native arteries of extremities with intermittent claudication, bilateral legs: Secondary | ICD-10-CM

## 2024-01-14 DIAGNOSIS — I714 Abdominal aortic aneurysm, without rupture, unspecified: Secondary | ICD-10-CM

## 2024-01-14 NOTE — Progress Notes (Signed)
 Office Note     CC:  follow up Requesting Provider:  Orlena Bitters, MD  HPI: Tony [name] is a 83 y.o. ([birth year]) male who presents for follow up of PAD and AAA. He is s/p aortic endarterectomy and repair of an infrarenal abdominal aortic aneurysm with an aortobiiliac bypass graft in June of 2021 by Dr. Shaunna Delaware. He has done very well since. He has known infrainguinal arterial disease with stable claudication. AT his last visit in July of 2024 he reported symptoms at about 200 ft. His claudication has been equal in both legs. He has not had any rest pain or tissue loss.   He was just seen at his cardiologists office, Dr. Lavonne Prairie on 5/16 and reported some burning in his legs with exercise and some increased discomfort on ambulation from previously reported symptoms. Dr. Lavonne Prairie ordered some non invasive studies.   He is here today to go over results of his studies and follow up. He explains that ever since his TAVR in December he feels his symptoms have progressed. He says mostly occurs when he really exerts himself like while out doing yard work. However he does say he cannot make it from front of [employer] to the back of the store without having to stop. Likely symptoms occur at about 100-150 ft. No pain at rest. Symptoms resolve with rest. No tissue loss. He feels he is still able to mostly do what he wants but was just concerned that he was putting himself at risk based on his symptoms so he wanted evaluated today. He denies any back or abdominal pain.  He is medically managed on Aspirin . He does not tolerated statins. He is a former smoker  Past Medical History:  Diagnosis Date   Arthritis    Bilateral hydrocele    CKD (chronic kidney disease), stage III (HCC)    Claudication of left lower extremity (HCC)    per pt when walks, go up stairs   Coronary artery disease cardiologist--- dr hochrein   s/p  severe 3V CAD,  CABG x3 02-09-2013; s/p AAA and bi-iliac bypass graft 02-09-2020    History of gout    09-07-2020  per pt has been several yrs, effects right great toe and trigger by asa   Hyperlipidemia    Hypertension    Occlusion and stenosis of bilateral carotid arteries    per last duplex in epic 05-05-2020 right ICA 40-59%, total occlusion left ICA   PVD (peripheral vascular disease) (HCC)    vascular --- dr Shaunna Delaware---  s/p  AAA repair and aorta bi-iliac bypass graft   S/P AAA (abdominal aortic aneurysm) repair 02/09/2020   S/P CABG x 3 02/09/2013   LIMA - LAD,  SVG -- RCA,  seqSVG -- OM1 and distal CFx   S/P TAVR (transcatheter aortic valve replacement) 08/13/2023   s/p TAVR with a 26 mm Edwards S3UR via the TF approach by Dr. Lorie Rook and Dr. Honey Lusty   Severe aortic stenosis    Type 2 diabetes mellitus (HCC)    followed by pcp---- (09-07-2020 per pt checks blood sugar in am dialy,  fasting sugar--- 118--130)   Wears glasses     Past Surgical History:  Procedure Laterality Date   ABDOMINAL AORTIC ANEURYSM REPAIR N/A 02/09/2020   Procedure: ABDOMINAL AORTIC ANEURYSM REPAIR;  Surgeon: Dannis Dy, MD;  Location: [facility] OR;  Service: Vascular;  Laterality: N/A;   ANTERIOR CERVICAL DECOMP/DISCECTOMY FUSION  12-13-2008  @ [facility]   C5--6   AORTA -  BILATERAL FEMORAL ARTERY BYPASS GRAFT Bilateral 02/09/2020   Procedure: AORTA BI-ILIAC  BYPASS GRAFT;  Surgeon: Dannis Dy, MD;  Location: [facility] OR;  Service: Vascular;  Laterality: Bilateral;   AORTIC ENDARTERECETOMY N/A 02/09/2020   Procedure: AORTIC ENDARTERECETOMY;  Surgeon: Dannis Dy, MD;  Location: [facility] OR;  Service: Vascular;  Laterality: N/A;   CARDIAC CATHETERIZATION  02-05-2013  dr hochrein   severe 3V CAD, preserved EF   CARDIAC CATHETERIZATION  per pt 1993   had one blockage with no intervention per cardiology note   CORONARY ARTERY BYPASS GRAFT N/A 02/09/2013   Procedure: CORONARY ARTERY BYPASS GRAFTING (CABG);  Surgeon: Heriberto London, MD;  Location: [facility] OR;  Service: Open Heart  Surgery;  Laterality: N/A;  LIMA to the LAD, SVG to right coronary artery, sequential SVG to OM and distal circumflex.   HYDROCELE EXCISION Bilateral 09/09/2020   Procedure: BILATERAL HYDROCELECTOMY ADULT;  Surgeon: Homero Luster, MD;  Location: [facility];  Service: Urology;  Laterality: Bilateral;   INTRAOPERATIVE TRANSESOPHAGEAL ECHOCARDIOGRAM N/A 02/09/2013   Procedure: INTRAOPERATIVE TRANSESOPHAGEAL ECHOCARDIOGRAM;  Surgeon: Heriberto London, MD;  Location: [facility] OR;  Service: Open Heart Surgery;  Laterality: N/A;   LEFT HEART CATH AND CORS/GRAFTS ANGIOGRAPHY N/A 02/04/2023   Procedure: LEFT HEART CATH AND CORS/GRAFTS ANGIOGRAPHY;  Surgeon: Arleen Lacer, MD;  Location: [facility] INVASIVE CV LAB;  Service: Cardiovascular;  Laterality: N/A;   LUMBAR FUSION  08-04-2007  @[facility]    L3 -- L5   REVERSE SHOULDER ARTHROPLASTY Right 06/25/2018   Procedure: RIGHT REVERSE SHOULDER ARTHROPLASTY;  Surgeon: Micheline Ahr, MD;  Location: [facility] OR;  Service: Orthopedics;  Laterality: Right;   SCROTAL EXPLORATION Bilateral 10/07/2020   Procedure: SCROTUM EXPLORATION, DRAINAGE OF HYDOCELE;  Surgeon: Homero Luster, MD;  Location: [facility];  Service: Urology;  Laterality: Bilateral;    Social History   Socioeconomic History   Marital status: Married    Spouse name: Not on file   Number of children: Not on file   Years of education: Not on file   Highest education level: Not on file  Occupational History   Not on file  Tobacco Use   Smoking status: Former    Current packs/day: 0.00    Average packs/day: 3.0 packs/day for 40.0 years (120.0 ttl pk-yrs)    Types: Cigarettes    Start date: 05/27/1952    Quit date: 05/27/1992    Years since quitting: 31.6   Smokeless tobacco: Never  Vaping Use   Vaping status: Never Used  Substance and Sexual Activity   Alcohol  use: Not Currently    Comment: QUIT 1993   Drug use: Never   Sexual activity: Not on file  Other Topics Concern   Not on file   Social History Narrative   Lives at home with wife.          Social Drivers of Corporate investment banker Strain: Not on file  Food Insecurity: No Food Insecurity (08/13/2023)   Hunger Vital Sign    Worried About Running Out of Food in the Last Year: Never true    Ran Out of Food in the Last Year: Never true  Transportation Needs: No Transportation Needs (08/13/2023)   PRAPARE - Administrator, Civil Service (Medical): No    Lack of Transportation (Non-Medical): No  Physical Activity: Not on file  Stress: Not on file  Social Connections: Not on file  Intimate Partner Violence: Not At Risk (08/13/2023)  Humiliation, Afraid, Rape, and Kick questionnaire    Fear of Current or Ex-Partner: No    Emotionally Abused: No    Physically Abused: No    Sexually Abused: No    Family History  Problem Relation Age of Onset   Heart failure Mother 48   Cancer Sister     Current Outpatient Medications  Medication Sig Dispense Refill   amLODipine  (NORVASC ) 10 MG tablet Take 1 tablet (10 mg total) by mouth daily. 90 tablet 3   benazepril  (LOTENSIN ) 20 MG tablet Take 1 tablet (20 mg total) by mouth daily. 90 tablet 3   Evolocumab  (REPATHA  SURECLICK) 140 MG/ML SOAJ Inject 140 mg into the skin every 14 (fourteen) days. 6 mL 3   furosemide  (LASIX ) 20 MG tablet Take 1 tablet (20 mg total) by mouth daily. 30 tablet 11   glipiZIDE  (GLUCOTROL ) 5 MG tablet Take 5 mg by mouth 2 (two) times daily.     ibuprofen (ADVIL) 200 MG tablet Take 400 mg by mouth every 8 (eight) hours as needed (pain.).     isosorbide  mononitrate (IMDUR ) 60 MG 24 hr tablet Take 1 tablet (60 mg total) by mouth daily. 30 tablet 11   nitroGLYCERIN  (NITROSTAT ) 0.4 MG SL tablet Place 1 tablet (0.4 mg total) under the tongue every 5 (five) minutes as needed for chest pain. 25 tablet PRN   rosuvastatin  (CRESTOR ) 20 MG tablet Take 1 tablet (20 mg total) by mouth daily. 90 tablet 3   aspirin  EC 81 MG tablet Take 1 tablet  (81 mg total) by mouth daily. Swallow whole. (Patient not taking: Reported on 01/14/2024) 90 tablet 3   No current facility-administered medications for this visit.    Allergies  Allergen Reactions   Cortisone Nausea And Vomiting   Statins Other (See Comments)    Pt states severe muscle pains.     REVIEW OF SYSTEMS:  [X]  denotes positive finding, [ ]  denotes negative finding Cardiac  Comments:  Chest pain or chest pressure:    Shortness of breath upon exertion:    Short of breath when lying flat:    Irregular heart rhythm:        Vascular    Pain in calf, thigh, or hip brought on by ambulation:    Pain in feet at night that wakes you up from your sleep:     Blood clot in your veins:    Leg swelling:         Pulmonary    Oxygen at home:    Productive cough:     Wheezing:         Neurologic    Sudden weakness in arms or legs:     Sudden numbness in arms or legs:     Sudden onset of difficulty speaking or slurred speech:    Temporary loss of vision in one eye:     Problems with dizziness:         Gastrointestinal    Blood in stool:     Vomited blood:         Genitourinary    Burning when urinating:     Blood in urine:        Psychiatric    Major depression:         Hematologic    Bleeding problems:    Problems with blood clotting too easily:        Skin    Rashes or ulcers:        Constitutional  Fever or chills:      PHYSICAL EXAMINATION:  Vitals:   01/14/24 1035  BP: (!) 145/72  Pulse: (!) 58  Resp: 18  Temp: 97.8 F (36.6 C)  TempSrc: Temporal  SpO2: 96%  Weight: 213 lb 1.6 oz (96.7 kg)  Height: 5\' 11"  (1.803 m)    General:  WDWN in NAD; vital signs documented above Gait: Normal HENT: WNL, normocephalic Pulmonary: normal non-labored breathing , without wheezing Cardiac: regular HR Abdomen: soft Vascular Exam/Pulses: 2+ femoral, Doppler DP/ Pero signals bilaterally, faint PT. Feet warm and well perfused Extremities: without ischemic  changes, without Gangrene , without cellulitis; without open wounds;  Musculoskeletal: no muscle wasting or atrophy  Neurologic: A&O X 3 Psychiatric:  The pt has Normal affect.   Non-Invasive Vascular Imaging:  01/09/24 +-------+-----------+-----------+------------+------------+  ABI/TBIToday's ABIToday's TBIPrevious ABIPrevious TBI  +-------+-----------+-----------+------------+------------+  Right 0.89       0.51       0.84        0.52          +-------+-----------+-----------+------------+------------+  Left  0.57       0.44       0.52        1.01          +-------+-----------+-----------+------------+------------+  Bilateral ABIs appear essentially unchanged. Right TBIs appear decreased.    VAS US  Lower extremity arterial bilateral:   +----------+--------+-----+--------+----------+--------+  RIGHT    PSV cm/sRatioStenosisWaveform  Comments  +----------+--------+-----+--------+----------+--------+  CFA Prox  144                  biphasic            +----------+--------+-----+--------+----------+--------+  DFA      46                   monophasic          +----------+--------+-----+--------+----------+--------+  SFA Prox  91                   monophasic          +----------+--------+-----+--------+----------+--------+  SFA Mid   131                  monophasic          +----------+--------+-----+--------+----------+--------+  SFA Distal20                   monophasic          +----------+--------+-----+--------+----------+--------+  POP Prox  32                   monophasic          +----------+--------+-----+--------+----------+--------+  POP Distal29                   monophasic          +----------+--------+-----+--------+----------+--------+  TP Trunk  38                   monophasic          +----------+--------+-----+--------+----------+--------+  ATA Mid   40                   monophasic           +----------+--------+-----+--------+----------+--------+  PTA Prox  18                   monophasic          +----------+--------+-----+--------+----------+--------+  PTA Mid   29  monophasic          +----------+--------+-----+--------+----------+--------+  PTA Distal26                   monophasic          +----------+--------+-----+--------+----------+--------+  PERO Mid  37                   monophasic          +----------+--------+-----+--------+----------+--------+  DP       30                   monophasic          +----------+--------+-----+--------+----------+--------+   +----------+--------+-----+--------+----------+--------+  LEFT     PSV cm/sRatioStenosisWaveform  Comments  +----------+--------+-----+--------+----------+--------+  CFA Prox  96                   monophasic          +----------+--------+-----+--------+----------+--------+  DFA      48                   monophasic          +----------+--------+-----+--------+----------+--------+  SFA Prox  36                   monophasic          +----------+--------+-----+--------+----------+--------+  SFA Mid   75                   monophasic          +----------+--------+-----+--------+----------+--------+  SFA Distal80                   monophasic          +----------+--------+-----+--------+----------+--------+  POP Prox  25                   monophasic          +----------+--------+-----+--------+----------+--------+  POP Distal26                   monophasic          +----------+--------+-----+--------+----------+--------+  TP Trunk  26                   monophasic          +----------+--------+-----+--------+----------+--------+  ATA Mid   79                   monophasic          +----------+--------+-----+--------+----------+--------+  PTA Prox  14                   monophasic           +----------+--------+-----+--------+----------+--------+  PTA Mid   0                    absent              +----------+--------+-----+--------+----------+--------+  PTA Distal0                    absent              +----------+--------+-----+--------+----------+--------+  PERO Mid  41                   monophasic          +----------+--------+-----+--------+----------+--------+   Summary:  Right: Large irregular calcified plaque throughout the right lower extremity with monophasic doppler  waveforms throughout, sugguestive of a more proximal occlusion.   Left: Large irregular calcified plaque throughout the left lower extremity with monophasic doppler waveforms throughout. No flow seen in the left mid to distal posterior tibial artery, suggestive of occlusion.    ASSESSMENT/PLAN:: 83 y.o. male here for follow up of PAD and AAA. He is s/p aortic endarterectomy and repair of an infrarenal abdominal aortic aneurysm with an aortobiiliac bypass graft in June of 2021 by Dr. Shaunna Delaware. He has done very well since. He has known infrainguinal arterial disease with stable claudication. Recent Duplex shows calcified plaque throughout bilateral lower extremities with monophasic flow. He does have a PT occlusion on the LLE. He has palpable femoral pulses on examination. ABIs overall stable bilaterally. His claudication has progressed a little from his last visit in July of 2024. However, symptoms are not disabling. No rest pain or tissue loss. Discussed recommendation for continued conservative therapy at this time. Encourage continued walking regimen/ exercise to help promote collaterals. Discussed importance of keeping his feet protected. He knows to follow up earlier should he have any new or worsening symptoms. He will continue his Aspirin . Recommend repeat ABI and follow up in 6 months.    Deneen Finical, PA-C [facility] [phone]  Clinic MD:   [place] [facility]

## 2024-01-15 ENCOUNTER — Other Ambulatory Visit: Payer: Self-pay | Admitting: *Deleted

## 2024-01-15 DIAGNOSIS — I714 Abdominal aortic aneurysm, without rupture, unspecified: Secondary | ICD-10-CM

## 2024-02-17 DIAGNOSIS — G453 Amaurosis fugax: Secondary | ICD-10-CM | POA: Diagnosis not present

## 2024-03-13 ENCOUNTER — Telehealth: Payer: Self-pay | Admitting: Physician Assistant

## 2024-03-13 NOTE — Telephone Encounter (Addendum)
 Called patient back about message. Patient stated he gets weak and BP drops to 110's when he takes his lasix  and works out in the sun. Patient was wondering if he could take something else or stop lasix  all together. SBP normally 130's and 120's, but with the heat outside it is not helping. Will send to Dr. Lavona and his nurse for advisement.

## 2024-03-13 NOTE — Telephone Encounter (Signed)
 Left message for patient to call back

## 2024-03-13 NOTE — Telephone Encounter (Signed)
 Pt c/o medication issue:  1. Name of Medication: Furosemide   2. How are you currently taking this medication (dosage and times per day)?   3. Are you having a reaction (difficulty breathing--STAT)?   4. What is your medication issue? Been making him dizzy when he works outside in the sun. He wants another medicine in the place of the Furosemide , so he can work out in the sun

## 2024-03-17 NOTE — Telephone Encounter (Signed)
 Left voice message to call back 7/22

## 2024-03-18 ENCOUNTER — Other Ambulatory Visit: Payer: Self-pay

## 2024-03-18 MED ORDER — FUROSEMIDE 20 MG PO TABS: 20.0000 mg | ORAL_TABLET | ORAL | Status: AC | PRN

## 2024-03-18 NOTE — Telephone Encounter (Signed)
 Pt is returning call to nurse.

## 2024-03-18 NOTE — Progress Notes (Signed)
 Called pt to relay Dr. Denver message. Pt agreeable to take Lasix  as needed.  Appt made with Dr. Lavona in [place].  Pt states Glipizide  was changed to once daily, medication list updated.

## 2024-03-18 NOTE — Telephone Encounter (Signed)
 Called pt to relay Dr. Denver message. Pt agreeable to take Lasix  as needed.  Appt made with Dr. Lavona in [place].  Pt states Glipizide  was changed to once daily, medication list updated.

## 2024-04-13 ENCOUNTER — Telehealth: Payer: Self-pay | Admitting: *Deleted

## 2024-04-13 NOTE — Telephone Encounter (Signed)
 Reminded of FLP ordered at last visit. Says he will have this done at his PCP's office next month.

## 2024-04-13 NOTE — Addendum Note (Signed)
 Addended by: Koy Lamp M on: 04/13/2024 09:47 AM   Modules accepted: Orders

## 2024-04-30 ENCOUNTER — Ambulatory Visit: Attending: Internal Medicine

## 2024-04-30 ENCOUNTER — Ambulatory Visit: Payer: Self-pay | Admitting: Cardiology

## 2024-04-30 ENCOUNTER — Other Ambulatory Visit (HOSPITAL_BASED_OUTPATIENT_CLINIC_OR_DEPARTMENT_OTHER): Payer: Self-pay | Admitting: Cardiology

## 2024-04-30 DIAGNOSIS — I6523 Occlusion and stenosis of bilateral carotid arteries: Secondary | ICD-10-CM

## 2024-05-04 DIAGNOSIS — R5383 Other fatigue: Secondary | ICD-10-CM | POA: Diagnosis not present

## 2024-05-04 DIAGNOSIS — Z79899 Other long term (current) drug therapy: Secondary | ICD-10-CM | POA: Diagnosis not present

## 2024-05-04 DIAGNOSIS — Z125 Encounter for screening for malignant neoplasm of prostate: Secondary | ICD-10-CM | POA: Diagnosis not present

## 2024-05-04 DIAGNOSIS — E7800 Pure hypercholesterolemia, unspecified: Secondary | ICD-10-CM | POA: Diagnosis not present

## 2024-05-05 LAB — LAB REPORT - SCANNED: EGFR: 69

## 2024-05-06 ENCOUNTER — Ambulatory Visit: Payer: Self-pay | Admitting: Cardiology

## 2024-05-31 DIAGNOSIS — I739 Peripheral vascular disease, unspecified: Secondary | ICD-10-CM | POA: Insufficient documentation

## 2024-05-31 NOTE — Progress Notes (Unsigned)
 Cardiology Office Note:   Date:  06/03/2024  ID:  Tony [name], DOB 04/11/1941, MRN [id number] PCP: Tony Leta NOVAK, MD  [facility] Providers Cardiologist:  Tony Schilling, MD {  History of Present Illness:   Tony [name] is a 83 y.o. male who presents for followup after CABG in June of 2014.  His most recent cath was June 2024.  He was found to have diffuse disease with a patent LIMA to the LAD.  He had an occluded SVG to the circ and occluded SVG to the RCA.  He had symptomatic AS and is now status post TAVR with a 26 mm Edwards Sapien 3 Ultra Resilia THV via the TF approach on 08/13/23 .  He called recently with HTN and increased SOB.   He was started on Lasix  20 mg daily.    He called recently and was having some leg burning with exercise.  I sent him for vascular lower extremity duplex study yesterday.  On the right he has irregular calcified plaque suggestive of proximal occlusion.  There is a large left plaque also suggestive of occlusion.  At the last visit he had leg pain and had a wound on his right great toe.  In 02/09/2020 he had aortic endarterectomy and repair of an infrarenal abdominal aortic aneurysm with aortobifem bypass grafting.  He had some stable claudication when he saw [facility] last and it was decided to manage him medically.  I referred him back to [facility] and they again suggested medical management.     He says he feels great.  His only complaint continues to be pain from his mid calf down.  He still tries to be active.  He is not having any resting shortness of breath and denies any PND or orthopnea.  He said no palpitations, presyncope or syncope.  ROS: As stated in the HPI and negative for all other systems.  Studies Reviewed:    EKG:     NA  Risk Assessment/Calculations:              Physical Exam:   VS:  BP 136/60   Pulse 64   Ht 5' 11 (1.803 m)   Wt 210 lb (95.3 kg)   BMI 29.29 kg/m    Wt Readings from Last 3 Encounters:  06/03/24 210 lb (95.3 kg)   01/14/24 213 lb 1.6 oz (96.7 kg)  01/10/24 211 lb (95.7 kg)     GEN: Well nourished, well developed in no acute distress NECK: No JVD; No carotid bruits CARDIAC: RRR, 3 out of 6 systolic murmur radiating slightly at the aortic outflow tract, no diastolic murmurs, rubs, gallops RESPIRATORY:  Clear to auscultation without rales, wheezing or rhonchi  ABDOMEN: Soft, non-tender, non-distended EXTREMITIES:  No edema; No deformity, decreased dorsalis pedis and posterior tibialis bilaterally  ASSESSMENT AND PLAN:   CAD/CABG: The patient has no new sypmtoms.  No further cardiovascular testing is indicated.  We will continue with aggressive risk reduction and meds as listed.  HTN:    The blood pressure is at target.  No change in therapy.    TAVR: He had stable valve replacement in Jan 2025.     CAROTID STENOSIS:   He had 40 to 59% stenosis in the right and chronic occluded left Doppler in Sept 2025.  I will repeat this in 1 year.   PVD:   He has follow-up in November with ABIs and [facility]   AAA:   He is status post  repair and again follows with [facility].   HYPERLIPIDEMIA:   LDL was excellent on Repatha .  No change in therapy.    DM:    A1C was 6.4.  No change in therapy.    Follow up with me in 1 year.  Signed, Tony Schilling, MD

## 2024-06-03 ENCOUNTER — Encounter: Payer: Self-pay | Admitting: Cardiology

## 2024-06-03 ENCOUNTER — Ambulatory Visit (INDEPENDENT_AMBULATORY_CARE_PROVIDER_SITE_OTHER): Admitting: Cardiology

## 2024-06-03 VITALS — BP 136/60 | HR 64 | Ht 71.0 in | Wt 210.0 lb

## 2024-06-03 DIAGNOSIS — I25810 Atherosclerosis of coronary artery bypass graft(s) without angina pectoris: Secondary | ICD-10-CM

## 2024-06-03 DIAGNOSIS — Z952 Presence of prosthetic heart valve: Secondary | ICD-10-CM

## 2024-06-03 DIAGNOSIS — I10 Essential (primary) hypertension: Secondary | ICD-10-CM

## 2024-06-03 DIAGNOSIS — E785 Hyperlipidemia, unspecified: Secondary | ICD-10-CM | POA: Diagnosis not present

## 2024-06-03 DIAGNOSIS — I739 Peripheral vascular disease, unspecified: Secondary | ICD-10-CM | POA: Diagnosis not present

## 2024-06-03 DIAGNOSIS — I6523 Occlusion and stenosis of bilateral carotid arteries: Secondary | ICD-10-CM

## 2024-06-03 NOTE — Patient Instructions (Addendum)

## 2024-07-14 ENCOUNTER — Ambulatory Visit: Admitting: Physician Assistant

## 2024-07-14 ENCOUNTER — Ambulatory Visit (HOSPITAL_COMMUNITY)
Admission: RE | Admit: 2024-07-14 | Discharge: 2024-07-14 | Disposition: A | Discharge status: Home / Self Care | Source: Ambulatory Visit | Attending: Physician Assistant | Admitting: Physician Assistant

## 2024-07-14 VITALS — BP 124/52 | HR 64 | Temp 98.3°F | Ht 71.0 in | Wt 206.0 lb

## 2024-07-14 DIAGNOSIS — I70213 Atherosclerosis of native arteries of extremities with intermittent claudication, bilateral legs: Secondary | ICD-10-CM

## 2024-07-14 DIAGNOSIS — I714 Abdominal aortic aneurysm, without rupture, unspecified: Secondary | ICD-10-CM

## 2024-07-14 LAB — VAS US ABI WITH/WO TBI
Left ABI: 0.59
Right ABI: 0.7

## 2024-07-14 NOTE — Progress Notes (Unsigned)
 Office Note     CC:  follow up Requesting Provider:  Rosamond Leta NOVAK, MD  HPI: ZAYVEN POWE is a 83 y.o. (Jan 21, 1941) male who presents for surveillance follow up of PAD and AAA. He has history of an aortic endarterectomy and repair of an infrarenal abdominal aortic aneurysm with an aortobiiliac bypass graft in June of 2021 by Dr. Eliza. He has done very well since. He has known infrainguinal arterial disease with stable claudication. His claudication has been equal in both legs. He has not had any rest pain or tissue loss.   When I last saw him in May his claudication symptoms were a little worse then before. He was having discomfort at about 100-150 ft on ambulation. Still improved with rest. No pain on rest or tissue loss. His non invasive studies were stable. I encouraged increased walking regimen and continued medical management at that time.    Today he says overall he is doing well. Still feels much better following his TAVR in December of 2024. He says he has so much more energy. He stays very active. He does report continued burning/ tightness in both his legs on ambulation if he exerts himself of if he goes of any grade of incline. However he says he continues to manages garden and yard work. He says he somewhat is limited on mowing or caring for his 2 acre yard because of his leg discomfort. He is able to do his ADL's and says his symptoms do not limit him from doing the activities he wants to. He has just been more so worried of what the future looks like for his legs since his blood flow is limited. He is medically managed on Aspirin . He does not tolerate statins. He is a former smoker   Past Medical History:  Diagnosis Date   Arthritis    Bilateral hydrocele    CKD (chronic kidney disease), stage III (HCC)    Claudication of left lower extremity    per pt when walks, go up stairs   Coronary artery disease cardiologist--- dr hochrein   s/p  severe 3V CAD,  CABG x3 02-09-2013; s/p  AAA and bi-iliac bypass graft 02-09-2020   History of gout    09-07-2020  per pt has been several yrs, effects right great toe and trigger by asa   Hyperlipidemia    Hypertension    Occlusion and stenosis of bilateral carotid arteries    per last duplex in epic 05-05-2020 right ICA 40-59%, total occlusion left ICA   PVD (peripheral vascular disease)    vascular --- dr eliza---  s/p  AAA repair and aorta bi-iliac bypass graft   S/P AAA (abdominal aortic aneurysm) repair 02/09/2020   S/P CABG x 3 02/09/2013   LIMA - LAD,  SVG -- RCA,  seqSVG -- OM1 and distal CFx   S/P TAVR (transcatheter aortic valve replacement) 08/13/2023   s/p TAVR with a 26 mm Edwards S3UR via the TF approach by Dr. Wendel and Dr. Maryjane   Severe aortic stenosis    Type 2 diabetes mellitus (HCC)    followed by pcp---- (09-07-2020 per pt checks blood sugar in am dialy,  fasting sugar--- 118--130)   Wears glasses     Past Surgical History:  Procedure Laterality Date   ABDOMINAL AORTIC ANEURYSM REPAIR N/A 02/09/2020   Procedure: ABDOMINAL AORTIC ANEURYSM REPAIR;  Surgeon: Eliza Lonni RAMAN, MD;  Location: [facility] OR;  Service: Vascular;  Laterality: N/A;   ANTERIOR CERVICAL DECOMP/DISCECTOMY  FUSION  12-13-2008  @ [facility]   C5--6   AORTA - BILATERAL FEMORAL ARTERY BYPASS GRAFT Bilateral 02/09/2020   Procedure: AORTA BI-ILIAC  BYPASS GRAFT;  Surgeon: Eliza Lonni RAMAN, MD;  Location: [facility] OR;  Service: Vascular;  Laterality: Bilateral;   AORTIC ENDARTERECETOMY N/A 02/09/2020   Procedure: AORTIC ENDARTERECETOMY;  Surgeon: Eliza Lonni RAMAN, MD;  Location: [facility] OR;  Service: Vascular;  Laterality: N/A;   CARDIAC CATHETERIZATION  02-05-2013  dr hochrein   severe 3V CAD, preserved EF   CARDIAC CATHETERIZATION  per pt 1993   had one blockage with no intervention per cardiology note   CORONARY ARTERY BYPASS GRAFT N/A 02/09/2013   Procedure: CORONARY ARTERY BYPASS GRAFTING (CABG);  Surgeon: Maude Fleeta Ochoa, MD;   Location: [facility] OR;  Service: Open Heart Surgery;  Laterality: N/A;  LIMA to the LAD, SVG to right coronary artery, sequential SVG to OM and distal circumflex.   HYDROCELE EXCISION Bilateral 09/09/2020   Procedure: BILATERAL HYDROCELECTOMY ADULT;  Surgeon: Watt Rush, MD;  Location: [facility];  Service: Urology;  Laterality: Bilateral;   INTRAOPERATIVE TRANSESOPHAGEAL ECHOCARDIOGRAM N/A 02/09/2013   Procedure: INTRAOPERATIVE TRANSESOPHAGEAL ECHOCARDIOGRAM;  Surgeon: Maude Fleeta Ochoa, MD;  Location: [facility] OR;  Service: Open Heart Surgery;  Laterality: N/A;   LEFT HEART CATH AND CORS/GRAFTS ANGIOGRAPHY N/A 02/04/2023   Procedure: LEFT HEART CATH AND CORS/GRAFTS ANGIOGRAPHY;  Surgeon: Anner Alm ORN, MD;  Location: [facility] INVASIVE CV LAB;  Service: Cardiovascular;  Laterality: N/A;   LUMBAR FUSION  08-04-2007  @[facility]    L3 -- L5   REVERSE SHOULDER ARTHROPLASTY Right 06/25/2018   Procedure: RIGHT REVERSE SHOULDER ARTHROPLASTY;  Surgeon: Cristy Bonner DASEN, MD;  Location: [facility] OR;  Service: Orthopedics;  Laterality: Right;   SCROTAL EXPLORATION Bilateral 10/07/2020   Procedure: SCROTUM EXPLORATION, DRAINAGE OF HYDOCELE;  Surgeon: Watt Rush, MD;  Location: [facility];  Service: Urology;  Laterality: Bilateral;    Social History   Socioeconomic History   Marital status: Married    Spouse name: Not on file   Number of children: Not on file   Years of education: Not on file   Highest education level: Not on file  Occupational History   Not on file  Tobacco Use   Smoking status: Former    Current packs/day: 0.00    Average packs/day: 3.0 packs/day for 40.0 years (120.0 ttl pk-yrs)    Types: Cigarettes    Start date: 05/27/1952    Quit date: 05/27/1992    Years since quitting: 32.1   Smokeless tobacco: Never  Vaping Use   Vaping status: Never Used  Substance and Sexual Activity   Alcohol  use: Not Currently    Comment: QUIT 1993   Drug use: Never   Sexual activity: Not on file   Other Topics Concern   Not on file  Social History Narrative   Lives at home with wife.          Social Drivers of Corporate Investment Banker Strain: Not on file  Food Insecurity: No Food Insecurity (08/13/2023)   Hunger Vital Sign    Worried About Running Out of Food in the Last Year: Never true    Ran Out of Food in the Last Year: Never true  Transportation Needs: No Transportation Needs (08/13/2023)   PRAPARE - Administrator, Civil Service (Medical): No    Lack of Transportation (Non-Medical): No  Physical Activity: Not on file  Stress: Not on file  Social  Connections: Not on file  Intimate Partner Violence: Not At Risk (08/13/2023)   Humiliation, Afraid, Rape, and Kick questionnaire    Fear of Current or Ex-Partner: No    Emotionally Abused: No    Physically Abused: No    Sexually Abused: No    Family History  Problem Relation Age of Onset   Heart failure Mother 3   Cancer Sister     Current Outpatient Medications  Medication Sig Dispense Refill   amLODipine  (NORVASC ) 10 MG tablet Take 1 tablet (10 mg total) by mouth daily. 90 tablet 3   aspirin  EC 81 MG tablet Take 1 tablet (81 mg total) by mouth daily. Swallow whole. 90 tablet 3   benazepril  (LOTENSIN ) 20 MG tablet Take 1 tablet (20 mg total) by mouth daily. 90 tablet 3   Evolocumab  (REPATHA  SURECLICK) 140 MG/ML SOAJ INJECT 140 MG INTO THE SKIN EVERY 14 (FOURTEEN) DAYS. 6 mL 3   furosemide  (LASIX ) 20 MG tablet Take 1 tablet (20 mg total) by mouth as needed for fluid or edema.     glipiZIDE  (GLUCOTROL ) 5 MG tablet Take 5 mg by mouth daily.     ibuprofen (ADVIL) 200 MG tablet Take 400 mg by mouth every 8 (eight) hours as needed (pain.).     isosorbide  mononitrate (IMDUR ) 60 MG 24 hr tablet Take 1 tablet (60 mg total) by mouth daily. 30 tablet 11   nitroGLYCERIN  (NITROSTAT ) 0.4 MG SL tablet Place 1 tablet (0.4 mg total) under the tongue every 5 (five) minutes as needed for chest pain. 25 tablet PRN    rosuvastatin  (CRESTOR ) 20 MG tablet Take 1 tablet (20 mg total) by mouth daily. 90 tablet 3   No current facility-administered medications for this visit.    Allergies  Allergen Reactions   Cortisone Nausea And Vomiting   Statins Other (See Comments)    Pt states severe muscle pains.     REVIEW OF SYSTEMS:  Negative unless noted in HPI [X]  denotes positive finding, [ ]  denotes negative finding Cardiac  Comments:  Chest pain or chest pressure:    Shortness of breath upon exertion:    Short of breath when lying flat:    Irregular heart rhythm:        Vascular    Pain in calf, thigh, or hip brought on by ambulation:    Pain in feet at night that wakes you up from your sleep:     Blood clot in your veins:    Leg swelling:         Pulmonary    Oxygen at home:    Productive cough:     Wheezing:         Neurologic    Sudden weakness in arms or legs:     Sudden numbness in arms or legs:     Sudden onset of difficulty speaking or slurred speech:    Temporary loss of vision in one eye:     Problems with dizziness:         Gastrointestinal    Blood in stool:     Vomited blood:         Genitourinary    Burning when urinating:     Blood in urine:        Psychiatric    Major depression:         Hematologic    Bleeding problems:    Problems with blood clotting too easily:        Skin  Rashes or ulcers:        Constitutional    Fever or chills:      PHYSICAL EXAMINATION:  Vitals:   07/14/24 1311  BP: (!) 124/52  Pulse: 64  Temp: 98.3 F (36.8 C)  SpO2: 94%  Weight: 206 lb (93.4 kg)  Height: 5' 11 (1.803 m)    General:  WDWN in NAD; vital signs documented above Gait: Normal HENT: WNL, normocephalic Pulmonary: normal non-labored breathing Cardiac: regular HR Abdomen: soft Vascular Exam/Pulses: 2+ radial, 2+ femoral, 2+ DP pulses bilaterally Extremities: without ischemic changes, without Gangrene , without cellulitis; without open wounds;   Musculoskeletal: no muscle wasting or atrophy  Neurologic: A&O X 3 Psychiatric:  The pt has Normal affect.   Non-Invasive Vascular Imaging:   +-------+-----------+-----------+------------+------------+  ABI/TBIToday's ABIToday's TBIPrevious ABIPrevious TBI  +-------+-----------+-----------+------------+------------+  Right 0.70       0.62       0.89        0.51          +-------+-----------+-----------+------------+------------+  Left  0.59       0.52       0.57        0.44          +-------+-----------+-----------+------------+------------+   Right ABIs appear decreased compared to prior study on 01/11/24. Left ABIs appear essentially unchanged compared to prior study on 01/11/24.   ASSESSMENT/PLAN:: 83 y.o. male here for surveillance follow up of PAD and AAA. He has history of an aortic endarterectomy and repair of an infrarenal abdominal aortic aneurysm with an aortobiiliac bypass graft in June of 2021 by Dr. Eliza. He has done very well since. He has claudication symptoms on both legs. This is not lifestyle limiting. He does not have any rest pain or tissue loss. In general he is very active and tolerates this well. His ABIs have decreased slightly on the right but are stable on the left. I have encouraged him to continue exercise. He will continues his Aspirin  and statin. He knows to call for earlier follow up if he has new or worsening symptoms. He will follow up up in 6 month with ABI   Teretha Damme, PA-C [facility] [phone]  On call MD:  Pearline

## 2024-07-15 ENCOUNTER — Other Ambulatory Visit: Payer: Self-pay | Admitting: *Deleted

## 2024-07-15 ENCOUNTER — Encounter: Payer: Self-pay | Admitting: Physician Assistant

## 2024-07-15 DIAGNOSIS — I70213 Atherosclerosis of native arteries of extremities with intermittent claudication, bilateral legs: Secondary | ICD-10-CM

## 2024-07-30 ENCOUNTER — Telehealth: Payer: Self-pay

## 2024-07-30 NOTE — Telephone Encounter (Signed)
 The patient has been scheduled for echo/office visit with Izetta Hummer on 09/14/2024.

## 2024-07-30 NOTE — Telephone Encounter (Signed)
-----   Message from Frederik Iha S sent at 07/30/2024 12:54 PM EST ----- Regarding: reschedule appt Patient needs to reschedule appt from 08/03/24 and also Echo on same day

## 2024-07-30 NOTE — Telephone Encounter (Signed)
 Left message to call back to reschedule echo/office visit.

## 2024-08-02 ENCOUNTER — Other Ambulatory Visit: Payer: Self-pay | Admitting: Cardiology

## 2024-08-03 ENCOUNTER — Ambulatory Visit (HOSPITAL_COMMUNITY)

## 2024-08-03 ENCOUNTER — Ambulatory Visit: Admitting: Physician Assistant

## 2024-08-07 ENCOUNTER — Ambulatory Visit: Payer: Medicare HMO

## 2024-08-07 ENCOUNTER — Other Ambulatory Visit (HOSPITAL_COMMUNITY): Payer: Medicare HMO

## 2024-09-04 ENCOUNTER — Other Ambulatory Visit: Payer: Self-pay | Admitting: Physician Assistant

## 2024-09-14 ENCOUNTER — Other Ambulatory Visit (HOSPITAL_COMMUNITY): Payer: Self-pay | Admitting: Physician Assistant

## 2024-09-14 ENCOUNTER — Ambulatory Visit
Admission: RE | Admit: 2024-09-14 | Discharge: 2024-09-14 | Disposition: A | Discharge status: Home / Self Care | Source: Ambulatory Visit | Attending: Internal Medicine | Admitting: Internal Medicine

## 2024-09-14 ENCOUNTER — Ambulatory Visit: Admitting: Physician Assistant

## 2024-09-14 ENCOUNTER — Ambulatory Visit: Payer: Self-pay | Admitting: Physician Assistant

## 2024-09-14 VITALS — BP 120/50 | HR 63 | Ht 71.0 in | Wt 205.8 lb

## 2024-09-14 DIAGNOSIS — I5032 Chronic diastolic (congestive) heart failure: Secondary | ICD-10-CM | POA: Diagnosis not present

## 2024-09-14 DIAGNOSIS — Z952 Presence of prosthetic heart valve: Secondary | ICD-10-CM | POA: Insufficient documentation

## 2024-09-14 DIAGNOSIS — I739 Peripheral vascular disease, unspecified: Secondary | ICD-10-CM | POA: Insufficient documentation

## 2024-09-14 DIAGNOSIS — I6523 Occlusion and stenosis of bilateral carotid arteries: Secondary | ICD-10-CM | POA: Insufficient documentation

## 2024-09-14 DIAGNOSIS — I10 Essential (primary) hypertension: Secondary | ICD-10-CM | POA: Insufficient documentation

## 2024-09-14 DIAGNOSIS — I25810 Atherosclerosis of coronary artery bypass graft(s) without angina pectoris: Secondary | ICD-10-CM | POA: Insufficient documentation

## 2024-09-14 DIAGNOSIS — E785 Hyperlipidemia, unspecified: Secondary | ICD-10-CM

## 2024-09-14 LAB — ECHOCARDIOGRAM COMPLETE
AR max vel: 2.11 cm2
AV Area VTI: 2.15 cm2
AV Area mean vel: 2.29 cm2
AV Mean grad: 10.5 mmHg
AV Peak grad: 20.7 mmHg
Ao pk vel: 2.28 m/s
Area-P 1/2: 2.58 cm2
P 1/2 time: 622 ms
S' Lateral: 3 cm

## 2024-09-14 NOTE — Patient Instructions (Signed)
 Medication Instructions:  Your physician recommends that you continue on your current medications as directed. Please refer to the Current Medication list given to you today.  *If you need a refill on your cardiac medications before your next appointment, please call your pharmacy*  Lab Work: None needed If you have labs (blood work) drawn today and your tests are completely normal, you will receive your results only by: MyChart Message (if you have MyChart) OR A paper copy in the mail If you have any lab test that is abnormal or we need to change your treatment, we will call you to review the results.  Testing/Procedures: None needed  Follow-Up: At [facility], you and your health needs are our priority.  As part of our continuing mission to provide you with exceptional heart care, our providers are all part of one team.  This team includes your primary Cardiologist (physician) and Advanced Practice Providers or APPs (Physician Assistants and Nurse Practitioners) who all work together to provide you with the care you need, when you need it.  Your next appointment:   As scheduled 02/11/2025  Provider:   Lynwood Schilling, MD    We recommend signing up for the patient portal called MyChart.  Sign up information is provided on this After Visit Summary.  MyChart is used to connect with patients for Virtual Visits (Telemedicine).  Patients are able to view lab/test results, encounter notes, upcoming appointments, etc.  Non-urgent messages can be sent to your provider as well.   To learn more about what you can do with MyChart, go to [email or web address].   Other Instructions

## 2024-09-14 NOTE — Progress Notes (Signed)
 " [facility]   MULTIDISCIPLINARY HEART VALVE CLINIC                                     Cardiology Office Note:    Date:  09/14/2024   ID:  Tony [name], DOB [birth year], MRN [id number]  PCP:  Rosamond Leta NOVAK, MD  [facility] Cardiologist:  Lynwood Schilling, MD  [facility] Structural heart: Lurena MARLA Red, MD [facility] Electrophysiologist:  None   Referring MD: Rosamond Leta NOVAK, MD   1 year s/p TAVR  History of Present Illness:    Tony [name] is a 84 y.o. male with a hx of HTN, HLD, CKD stage II, obesity (BMI 30), T2DM, 1st degree AV block, CAD s/p CABG (LIMA-->LAD, sSVG-->OM1 & dLCx, SVG-->RCA), PAD s/p AAA repair and aortoiliac grafting, carotid artery disease, and severe AS s/p TAVR (08/13/23) who presents to clinic for follow up.   He was seen by Dr. Schilling recently and reported dyspnea and fatigue as well as exertional chest pain. [facility] 02/04/23 showed severe native vessel CAD with 2/4 patent grafts: patent LIMA to LAD, occluded vein graft RCA and occluded distal limb of sequential vein graft. Echocardiogram 05/27/23 showed EF 70% and progression to severe aortic stenosis with mean grad 42 mmHg, AVA 0.98 cm2.  He underwent TAVR with a 26 mm Edwards Sapien 3 Ultra Resilia THV via the TF approach on 08/13/23. Post operative echo showed EF 60%, normally functioning TAVR with a mean gradient of 9 mmHg and trivial PVL. He did develop a transient new LBBB in addition to his chronic 1st degree block and a Zio AT was placed which showed one 3.9 second nocturnal pause. This was managed conservatively.    Today the patient presents to clinic for follow up. He has lost >30 lbs. Eating a lot of veggies from his own garden. Wife can foods. No CP or SOB. No LE edema, orthopnea or PND. No dizziness or syncope. No blood in stool or urine. No palpitations. Biggest complaint is LE exertional burning. Wanted to get repeat dopplers of his legs.    Past Medical History:  Diagnosis  Date   Arthritis    Bilateral hydrocele    CKD (chronic kidney disease), stage III (HCC)    Claudication of left lower extremity    per pt when walks, go up stairs   Coronary artery disease cardiologist--- dr hochrein   s/p  severe 3V CAD,  CABG x3 02-09-2013; s/p AAA and bi-iliac bypass graft 02-09-2020   History of gout    09-07-2020  per pt has been several yrs, effects right great toe and trigger by asa   Hyperlipidemia    Hypertension    Occlusion and stenosis of bilateral carotid arteries    per last duplex in epic 05-05-2020 right ICA 40-59%, total occlusion left ICA   PVD (peripheral vascular disease)    vascular --- dr eliza---  s/p  AAA repair and aorta bi-iliac bypass graft   S/P AAA (abdominal aortic aneurysm) repair 02/09/2020   S/P CABG x 3 02/09/2013   LIMA - LAD,  SVG -- RCA,  seqSVG -- OM1 and distal CFx   S/P TAVR (transcatheter aortic valve replacement) 08/13/2023   s/p TAVR with a 26 mm Edwards S3UR via the TF approach by Dr. Red and Dr. Maryjane   Severe aortic stenosis    Type 2 diabetes  mellitus (HCC)    followed by pcp---- (09-07-2020 per pt checks blood sugar in am dialy,  fasting sugar--- 118--130)   Wears glasses      Current Medications: Active Medications[1]    ROS:   Please see the history of present illness.    All other systems reviewed and are negative.  EKGs       Risk Assessment/Calculations:           Physical Exam:    VS:  BP (!) 120/50   Pulse 63   Ht 5' 11 (1.803 m)   Wt 205 lb 12.8 oz (93.4 kg)   SpO2 96%   BMI 28.70 kg/m     Wt Readings from Last 3 Encounters:  09/14/24 205 lb 12.8 oz (93.4 kg)  07/14/24 206 lb (93.4 kg)  06/03/24 210 lb (95.3 kg)     GEN: Well nourished, well developed in no acute distress NECK: No JVD CARDIAC: 2/6 SEM loudest @ RUSB but heard throughout precordium. No rubs, gallops RESPIRATORY:  Clear to auscultation without rales, wheezing or rhonchi  ABDOMEN: Soft, non-tender,  non-distended EXTREMITIES:  No edema; No deformity.   ASSESSMENT:    1. S/P TAVR (transcatheter aortic valve replacement)   2. Chronic heart failure with preserved ejection fraction (HFpEF) (HCC)   3. Coronary artery disease involving coronary bypass graft of native heart without angina pectoris   4. Essential hypertension   5. Bilateral carotid artery stenosis   6. PAD (peripheral artery disease)   7. Dyslipidemia     PLAN:    In order of problems listed above:  Severe AS s/p TAVR:  -- Echo today shows EF 65%, mod LVH, mild MR, normally functioning TAVR with a mean gradient of 10 mm hg and mild PVL.  -- NYHA class I symptoms. -- Continue aspirin  81mg  daily.  -- Continue SBE prophylaxis as indicated.  -- Continue regular follow up with Dr. Lavona.    Chronic HFpEF:  -- Appears euvolemic. -- Continue Lasix  20mg  daily.    CAD s/p CABG:  -- [facility] 02/04/23 showed severe native vessel CAD with 2/4 patent grafts: patent LIMA to LAD, occluded vein graft RCA and occluded distal limb of sequential vein graft.  -- Continue medical management with aspirin  81mg  daily and Repatha  140mg  q 2 weeks.    HTN:  -- BP with good control.  -- Continue Imdur  60mg  daily, benazepril  20mg  BID and Norvasc  10mg  daily.    Carotid stenosis:  -- 40-59% stenosis in the right and chronic occluded left on dopplers in 04/2024.  -- Continue medical management.    PAD/AAA s/p repair:  -- Continue medical therapy. -- Having continued claudication symptoms. He wanted updated dopplers. He is seeing vascular in May with repeat dopplers. I recommended he called their office to have that moved up.    HLD:  -- He cannot tolerate high dose statins. -- Now on Repatha  140mg  q 2 weeks.  -- LDL 53 in 10/2023. Followed by Dr. Rosamond.    Medication Adjustments/Labs and Tests Ordered: Current medicines are reviewed at length with the patient today.  Concerns regarding medicines are outlined above.  No orders of the  defined types were placed in this encounter.  No orders of the defined types were placed in this encounter.   Patient Instructions  Medication Instructions:  Your physician recommends that you continue on your current medications as directed. Please refer to the Current Medication list given to you today.  *If you need a  refill on your cardiac medications before your next appointment, please call your pharmacy*  Lab Work: None needed If you have labs (blood work) drawn today and your tests are completely normal, you will receive your results only by: MyChart Message (if you have MyChart) OR A paper copy in the mail If you have any lab test that is abnormal or we need to change your treatment, we will call you to review the results.  Testing/Procedures: None needed  Follow-Up: At [facility], you and your health needs are our priority.  As part of our continuing mission to provide you with exceptional heart care, our providers are all part of one team.  This team includes your primary Cardiologist (physician) and Advanced Practice Providers or APPs (Physician Assistants and Nurse Practitioners) who all work together to provide you with the care you need, when you need it.  Your next appointment:   As scheduled 02/11/2025  Provider:   Lynwood Schilling, MD    We recommend signing up for the patient portal called MyChart.  Sign up information is provided on this After Visit Summary.  MyChart is used to connect with patients for Virtual Visits (Telemedicine).  Patients are able to view lab/test results, encounter notes, upcoming appointments, etc.  Non-urgent messages can be sent to your provider as well.   To learn more about what you can do with MyChart, go to [email or web address].   Other Instructions             Signed, Lamarr Hummer, PA-C  09/14/2024 4:57 PM    [facility]     [1]  Current Meds  Medication Sig   amLODipine   (NORVASC ) 10 MG tablet TAKE 1 TABLET BY MOUTH EVERY DAY   aspirin  EC 81 MG tablet Take 1 tablet (81 mg total) by mouth daily. Swallow whole.   benazepril  (LOTENSIN ) 20 MG tablet Take 1 tablet (20 mg total) by mouth daily.   Evolocumab  (REPATHA  SURECLICK) 140 MG/ML SOAJ INJECT 140 MG INTO THE SKIN EVERY 14 (FOURTEEN) DAYS.   furosemide  (LASIX ) 20 MG tablet Take 1 tablet (20 mg total) by mouth as needed for fluid or edema.   glipiZIDE  (GLUCOTROL ) 5 MG tablet Take 5 mg by mouth daily.   ibuprofen (ADVIL) 200 MG tablet Take 400 mg by mouth every 8 (eight) hours as needed (pain.).   isosorbide  mononitrate (IMDUR ) 60 MG 24 hr tablet TAKE 1 TABLET BY MOUTH EVERY DAY   nitroGLYCERIN  (NITROSTAT ) 0.4 MG SL tablet Place 1 tablet (0.4 mg total) under the tongue every 5 (five) minutes as needed for chest pain.   "

## 2024-12-29 ENCOUNTER — Ambulatory Visit

## 2024-12-29 ENCOUNTER — Ambulatory Visit (HOSPITAL_COMMUNITY)

## 2025-02-10 ENCOUNTER — Ambulatory Visit: Admitting: Cardiology

## 2025-02-11 ENCOUNTER — Ambulatory Visit: Admitting: Cardiology
